# Patient Record
Sex: Male | Born: 1960 | Race: White | Hispanic: No | Marital: Married | Smoking: Never smoker
Health system: Southern US, Community
[De-identification: ages and names within clinical notes are randomized; demographics above are authoritative.]

## PROBLEM LIST (undated history)

## (undated) DIAGNOSIS — E785 Hyperlipidemia, unspecified: Secondary | ICD-10-CM

## (undated) DIAGNOSIS — M1990 Unspecified osteoarthritis, unspecified site: Secondary | ICD-10-CM

## (undated) DIAGNOSIS — I10 Essential (primary) hypertension: Secondary | ICD-10-CM

---

## 2006-08-30 ENCOUNTER — Emergency Department: Payer: Self-pay | Admitting: Emergency Medicine

## 2006-08-30 ENCOUNTER — Other Ambulatory Visit: Payer: Self-pay

## 2006-09-19 ENCOUNTER — Ambulatory Visit: Payer: Self-pay | Admitting: Family Medicine

## 2006-10-01 ENCOUNTER — Ambulatory Visit: Payer: Self-pay | Admitting: Gastroenterology

## 2006-10-06 ENCOUNTER — Ambulatory Visit: Payer: Self-pay | Admitting: Surgery

## 2006-10-06 HISTORY — PX: CHOLECYSTECTOMY: SHX55

## 2016-02-20 ENCOUNTER — Emergency Department: Payer: BLUE CROSS/BLUE SHIELD

## 2016-02-20 ENCOUNTER — Encounter: Payer: Self-pay | Admitting: Emergency Medicine

## 2016-02-20 ENCOUNTER — Emergency Department
Admission: EM | Admit: 2016-02-20 | Discharge: 2016-02-20 | Disposition: A | Discharge status: Home / Self Care | Payer: BLUE CROSS/BLUE SHIELD | Attending: Emergency Medicine | Admitting: Emergency Medicine

## 2016-02-20 DIAGNOSIS — R079 Chest pain, unspecified: Secondary | ICD-10-CM | POA: Diagnosis present

## 2016-02-20 LAB — BASIC METABOLIC PANEL
ANION GAP: 6 (ref 5–15)
BUN: 21 mg/dL — AB (ref 6–20)
CHLORIDE: 107 mmol/L (ref 101–111)
CO2: 27 mmol/L (ref 22–32)
Calcium: 9.3 mg/dL (ref 8.9–10.3)
Creatinine, Ser: 0.7 mg/dL (ref 0.61–1.24)
GFR calc Af Amer: >60 mL/min (ref >60)
Glucose, Bld: 123 mg/dL — ABNORMAL HIGH (ref 65–99)
POTASSIUM: 4.1 mmol/L (ref 3.5–5.1)
SODIUM: 140 mmol/L (ref 135–145)

## 2016-02-20 LAB — CBC
HEMATOCRIT: 50.5 % (ref 40.0–52.0)
HEMOGLOBIN: 17.1 g/dL (ref 13.0–18.0)
MCH: 29.2 pg (ref 26.0–34.0)
MCHC: 33.8 g/dL (ref 32.0–36.0)
MCV: 86.3 fL (ref 80.0–100.0)
Platelets: 291 10*3/uL (ref 150–440)
RBC: 5.85 MIL/uL (ref 4.40–5.90)
RDW: 13.6 % (ref 11.5–14.5)
WBC: 9.8 10*3/uL (ref 3.8–10.6)

## 2016-02-20 LAB — TROPONIN I
Troponin I: <0.03 ng/mL (ref <0.031)
Troponin I: 0.03 ng/mL (ref <0.031)

## 2016-02-20 LAB — FIBRIN DERIVATIVES D-DIMER (ARMC ONLY): FIBRIN DERIVATIVES D-DIMER (ARMC): 333 (ref 0–499)

## 2016-02-20 MED ORDER — ASPIRIN 81 MG PO CHEW
324.0000 mg | CHEWABLE_TABLET | Freq: Once | ORAL | Status: AC
  Administered 2016-02-20: 324 mg via ORAL
  Filled 2016-02-20: qty 4

## 2016-02-20 NOTE — ED Provider Notes (Signed)
[facility] Emergency Department Provider Note  ____________________________________________  Time seen: 10:05 AM  I have reviewed the triage vital signs and the nursing notes.   HISTORY  Chief Complaint Chest Pain     HPI Vincent Clements is a 55 y.o. male presents with intermittent left chest pain lasting approximately 5 minutes over the past week. Patient denies any dyspnea no nausea or vomiting or diaphoresis. Patient states the episode this morning started at 6 AM and has persisted. He describes the pain as an aching sensation in his left chest.   Past medical history None There are no active problems to display for this patient.   Past surgical history None  Current Outpatient Rx  Name  Route  Sig  Dispense  Refill  . aspirin-acetaminophen-caffeine (EXCEDRIN MIGRAINE) 250-250-65 MG tablet   Oral   Take 1 tablet by mouth every 6 (six) hours as needed for headache.           Allergies No known drug allergies History reviewed. No pertinent family history.  Social History Social History  Substance Use Topics  . Smoking status: Never Smoker   . Smokeless tobacco: None  . Alcohol Use: Yes    Review of Systems  Constitutional: Negative for fever. Eyes: Negative for visual changes. ENT: Negative for sore throat. Cardiovascular: Positive for chest pain. Respiratory: Negative for shortness of breath. Gastrointestinal: Negative for abdominal pain, vomiting and diarrhea. Genitourinary: Negative for dysuria. Musculoskeletal: Negative for back pain. Skin: Negative for rash. Neurological: Negative for headaches, focal weakness or numbness.   10-point ROS otherwise negative.  ____________________________________________   PHYSICAL EXAM:  VITAL SIGNS: ED Triage Vitals  Enc Vitals Group     BP 02/20/16 0719 148/81 mmHg     Pulse Rate 02/20/16 0719 80     Resp 02/20/16 0719 20     Temp 02/20/16 0719 98.2 F (36.8 C)     Temp Source  02/20/16 0719 Oral     SpO2 02/20/16 0719 95 %     Weight 02/20/16 0719 250 lb (113.399 kg)     Height 02/20/16 0719 5\' 6"  (1.676 m)     Head Cir --      Peak Flow --      Pain Score 02/20/16 0715 0     Pain Loc --      Pain Edu? --      Excl. in [place]? --      Constitutional: Alert and oriented. Well appearing and in no distress. Eyes: Conjunctivae are normal. PERRL. Normal extraocular movements. ENT   Head: Normocephalic and atraumatic.   Nose: No congestion/rhinnorhea.   Mouth/Throat: Mucous membranes are moist.   Neck: No stridor. Hematological/Lymphatic/Immunilogical: No cervical lymphadenopathy. Cardiovascular: Normal rate, regular rhythm. Normal and symmetric distal pulses are present in all extremities. No murmurs, rubs, or gallops. Respiratory: Normal respiratory effort without tachypnea nor retractions. Breath sounds are clear and equal bilaterally. No wheezes/rales/rhonchi. Gastrointestinal: Soft and nontender. No distention. There is no CVA tenderness. Genitourinary: deferred Musculoskeletal: Nontender with normal range of motion in all extremities. No joint effusions.  No lower extremity tenderness nor edema. Neurologic:  Normal speech and language. No gross focal neurologic deficits are appreciated. Speech is normal.  Skin:  Skin is warm, dry and intact. No rash noted. Psychiatric: Mood and affect are normal. Speech and behavior are normal. Patient exhibits appropriate insight and judgment.  ____________________________________________    LABS (pertinent positives/negatives)  Labs Reviewed  BASIC METABOLIC PANEL - Abnormal; Notable  for the following:    Glucose, Bld 123 (*)    BUN 21 (*)    All other components within normal limits  TROPONIN I  CBC  TROPONIN I  FIBRIN DERIVATIVES D-DIMER ([facility] ONLY)     ____________________________________________   EKG  ED ECG REPORT I, Zeph Riebel, [place], the attending physician, personally viewed and  interpreted this ECG.   Date: 02/20/2016  EKG Time: 7:10 AM  Rate: 76  Rhythm: Normal sinus rhythm  Axis: Normal  Intervals: Normal  ST&T Change: None   ____________________________________________    RADIOLOGY  DG Chest 2 View (Final result) Result time: 02/20/16 07:37:11   Final result by Rad Results In Interface (02/20/16 07:37:11)   Narrative:   CLINICAL DATA: Left-sided chest pain for 1 week.  EXAM: CHEST 2 VIEW  COMPARISON: None.  FINDINGS: The heart size and mediastinal contours are within normal limits. Both lungs are clear. The visualized skeletal structures are unremarkable.  IMPRESSION: No active cardiopulmonary disease.   Electronically Signed By: Rolm Baptise M.D. On: 02/20/2016 07:37         INITIAL IMPRESSION / ASSESSMENT AND PLAN / ED COURSE  Pertinent labs & imaging results that were available during my care of the patient were reviewed by me and considered in my medical decision making (see chart for details).  History of physical exam concerning for possible angina. Patient without any pain at this time cardiac enzymes negative 2 d-dimer is negative we'll refer patient to cardiologist on-call for outpatient stress test.  ____________________________________________   FINAL CLINICAL IMPRESSION(S) / ED DIAGNOSES  Final diagnoses:  Chest pain, unspecified chest pain type      Gregor Hams, MD 02/20/16 1401

## 2016-02-20 NOTE — ED Notes (Signed)
Pt to ed with c/o chest pain that started about 1 week ago in left chest, intermittent,  Reports worse last night, last episode about 6 am today.  Pt reports nausea, denies vomiting, and reports dizziness associated with the pain.

## 2016-02-20 NOTE — Discharge Instructions (Signed)
Nonspecific Chest Pain  °Chest pain can be caused by many different conditions. There is always a chance that your pain could be related to something serious, such as a heart attack or a blood clot in your lungs. Chest pain can also be caused by conditions that are not life-threatening. If you have chest pain, it is very important to follow up with your health care provider. °CAUSES  °Chest pain can be caused by: °· Heartburn. °· Pneumonia or bronchitis. °· Anxiety or stress. °· Inflammation around your heart (pericarditis) or lung (pleuritis or pleurisy). °· A blood clot in your lung. °· A collapsed lung (pneumothorax). It can develop suddenly on its own (spontaneous pneumothorax) or from trauma to the chest. °· Shingles infection (varicella-zoster virus). °· Heart attack. °· Damage to the bones, muscles, and cartilage that make up your chest wall. This can include: °¨ Bruised bones due to injury. °¨ Strained muscles or cartilage due to frequent or repeated coughing or overwork. °¨ Fracture to one or more ribs. °¨ Sore cartilage due to inflammation (costochondritis). °RISK FACTORS  °Risk factors for chest pain may include: °· Activities that increase your risk for trauma or injury to your chest. °· Respiratory infections or conditions that cause frequent coughing. °· Medical conditions or overeating that can cause heartburn. °· Heart disease or family history of heart disease. °· Conditions or health behaviors that increase your risk of developing a blood clot. °· Having had chicken pox (varicella zoster). °SIGNS AND SYMPTOMS °Chest pain can feel like: °· Burning or tingling on the surface of your chest or deep in your chest. °· Crushing, pressure, aching, or squeezing pain. °· Dull or sharp pain that is worse when you move, cough, or take a deep breath. °· Pain that is also felt in your back, neck, shoulder, or arm, or pain that spreads to any of these areas. °Your chest pain may come and go, or it may stay  constant. °DIAGNOSIS °Lab tests or other studies may be needed to find the cause of your pain. Your health care provider may have you take a test called an ambulatory ECG (electrocardiogram). An ECG records your heartbeat patterns at the time the test is performed. You may also have other tests, such as: °· Transthoracic echocardiogram (TTE). During echocardiography, sound waves are used to create a picture of all of the heart structures and to look at how blood flows through your heart. °· Transesophageal echocardiogram (TEE). This is a more advanced imaging test that obtains images from inside your body. It allows your health care provider to see your heart in finer detail. °· Cardiac monitoring. This allows your health care provider to monitor your heart rate and rhythm in real time. °· Holter monitor. This is a portable device that records your heartbeat and can help to diagnose abnormal heartbeats. It allows your health care provider to track your heart activity for several days, if needed. °· Stress tests. These can be done through exercise or by taking medicine that makes your heart beat more quickly. °· Blood tests. °· Imaging tests. °TREATMENT  °Your treatment depends on what is causing your chest pain. Treatment may include: °· Medicines. These may include: °¨ Acid blockers for heartburn. °¨ Anti-inflammatory medicine. °¨ Pain medicine for inflammatory conditions. °¨ Antibiotic medicine, if an infection is present. °¨ Medicines to dissolve blood clots. °¨ Medicines to treat coronary artery disease. °· Supportive care for conditions that do not require medicines. This may include: °¨ Resting. °¨ Applying heat   or cold packs to injured areas. °¨ Limiting activities until pain decreases. °HOME CARE INSTRUCTIONS °· If you were prescribed an antibiotic medicine, finish it all even if you start to feel better. °· Avoid any activities that bring on chest pain. °· Do not use any tobacco products, including  cigarettes, chewing tobacco, or electronic cigarettes. If you need help quitting, ask your health care provider. °· Do not drink alcohol. °· Take medicines only as directed by your health care provider. °· Keep all follow-up visits as directed by your health care provider. This is important. This includes any further testing if your chest pain does not go away. °· If heartburn is the cause for your chest pain, you may be told to keep your head raised (elevated) while sleeping. This reduces the chance that acid will go from your stomach into your esophagus. °· Make lifestyle changes as directed by your health care provider. These may include: °¨ Getting regular exercise. Ask your health care provider to suggest some activities that are safe for you. °¨ Eating a heart-healthy diet. A registered dietitian can help you to learn healthy eating options. °¨ Maintaining a healthy weight. °¨ Managing diabetes, if necessary. °¨ Reducing stress. °SEEK MEDICAL CARE IF: °· Your chest pain does not go away after treatment. °· You have a rash with blisters on your chest. °· You have a fever. °SEEK IMMEDIATE MEDICAL CARE IF:  °· Your chest pain is worse. °· You have an increasing cough, or you cough up blood. °· You have severe abdominal pain. °· You have severe weakness. °· You faint. °· You have chills. °· You have sudden, unexplained chest discomfort. °· You have sudden, unexplained discomfort in your arms, back, neck, or jaw. °· You have shortness of breath at any time. °· You suddenly start to sweat, or your skin gets clammy. °· You feel nauseous or you vomit. °· You suddenly feel light-headed or dizzy. °· Your heart begins to beat quickly, or it feels like it is skipping beats. °These symptoms may represent a serious problem that is an emergency. Do not wait to see if the symptoms will go away. Get medical help right away. Call your local emergency services (911 in the [place] Do not drive yourself to the hospital. °  °This  information is not intended to replace advice given to you by your health care provider. Make sure you discuss any questions you have with your health care provider. °  °Document Released: 09/25/2005 Document Revised: 01/06/2015 Document Reviewed: 07/22/2014 °Elsevier Interactive Patient Education ©2016 [employer] ° °

## 2016-04-29 DIAGNOSIS — I208 Other forms of angina pectoris: Secondary | ICD-10-CM | POA: Diagnosis not present

## 2016-04-29 DIAGNOSIS — I429 Cardiomyopathy, unspecified: Secondary | ICD-10-CM | POA: Diagnosis not present

## 2016-04-29 DIAGNOSIS — R0602 Shortness of breath: Secondary | ICD-10-CM | POA: Diagnosis not present

## 2016-04-29 DIAGNOSIS — R011 Cardiac murmur, unspecified: Secondary | ICD-10-CM | POA: Diagnosis not present

## 2016-11-08 DIAGNOSIS — I208 Other forms of angina pectoris: Secondary | ICD-10-CM | POA: Diagnosis not present

## 2016-11-08 DIAGNOSIS — R0602 Shortness of breath: Secondary | ICD-10-CM | POA: Diagnosis not present

## 2016-11-08 DIAGNOSIS — I10 Essential (primary) hypertension: Secondary | ICD-10-CM | POA: Diagnosis not present

## 2016-11-08 DIAGNOSIS — I429 Cardiomyopathy, unspecified: Secondary | ICD-10-CM | POA: Diagnosis not present

## 2017-01-19 ENCOUNTER — Ambulatory Visit (INDEPENDENT_AMBULATORY_CARE_PROVIDER_SITE_OTHER): Payer: BLUE CROSS/BLUE SHIELD

## 2017-01-19 ENCOUNTER — Ambulatory Visit
Admission: EM | Admit: 2017-01-19 | Discharge: 2017-01-19 | Disposition: A | Discharge status: Home / Self Care | Payer: BLUE CROSS/BLUE SHIELD | Attending: Family Medicine | Admitting: Family Medicine

## 2017-01-19 DIAGNOSIS — M25561 Pain in right knee: Secondary | ICD-10-CM

## 2017-01-19 HISTORY — DX: Essential (primary) hypertension: I10

## 2017-01-19 MED ORDER — MELOXICAM 15 MG PO TABS: 15.0000 mg | ORAL_TABLET | Freq: Every day | ORAL | 0 refills | Status: DC

## 2017-01-19 NOTE — ED Triage Notes (Signed)
Patient complains of right knee pain. Patient states that the pain started a while back only with bending. Patient states that pain worsened around 10-14 days ago with ambulation but after walking approx 40 feet pain improved. Patient states that intense pain started yesterday and states that pain now makes it difficult to stand or walk.

## 2017-01-19 NOTE — ED Provider Notes (Signed)
[facility]    CSN: [id number] Arrival date & time: 01/19/17  0804     History   Chief Complaint Chief Complaint  Patient presents with  . Knee Pain    HPI Vincent Clements is a 56 y.o. male.   Vincent Clements is here because of right knee pain. Reports injuring the knee about 10:15 years ago while [place] and hurting his right knee. He states he saw his PCP Vincent Clements who had his mouth about 2 weeks for swelling of the right knee to go down past the knee started bothering him. Since then he's had periodic trouble with the knee but last 2 days work especially yesterday he's had increased pain and difficulty standing and walking on the right knee. He states normally in the morning when he'll have pain in the right knee uses day goes on the knee gets better this Saturday he was unable to really do anything with her right knee because the constant pain. States that the knee doesn't lock up was very stiff. Mostly on the medial aspect of the knee. He has a history hypertension he's had a cholecystectomy. His PCP is retired about over a year ago he does not smoke no known drug allergies and no pertinent family medical history relevant to today's visit   The history is provided by the patient.  Knee Pain  Location:  Knee Injury: yes   Knee location:  R knee Pain details:    Quality:  Aching and pressure   Radiates to:  Does not radiate   Severity:  Moderate   Timing:  Constant   Progression:  Worsening Chronicity:  Chronic Dislocation: no   Foreign body present:  No foreign bodies   Past Medical History:  Diagnosis Date  . Hypertension     There are no active problems to display for this patient.   Past Surgical History:  Procedure Laterality Date  . CHOLECYSTECTOMY  2006       Home Medications    Prior to Admission medications   Medication Sig Start Date End Date Taking? Authorizing Provider  carvedilol (COREG) 6.25 MG tablet Take 6.25  mg by mouth 2 (two) times daily with a meal.   Yes Historical Provider, MD  aspirin-acetaminophen-caffeine (EXCEDRIN MIGRAINE) 250-250-65 MG tablet Take 1 tablet by mouth every 6 (six) hours as needed for headache.    Historical Provider, MD  meloxicam (MOBIC) 15 MG tablet Take 1 tablet (15 mg total) by mouth daily. 01/19/17   Frederich Cha, MD    Family History History reviewed. No pertinent family history.  Social History Social History  Substance Use Topics  . Smoking status: Never Smoker  . Smokeless tobacco: Never Used  . Alcohol use Yes     Comment: occasionally     Allergies   Patient has no known allergies.   Review of Systems Review of Systems  Musculoskeletal: Positive for arthralgias, gait problem, joint swelling and myalgias.  All other systems reviewed and are negative.    Physical Exam Triage Vital Signs ED Triage Vitals  Enc Vitals Group     BP 01/19/17 0836 (!) 154/84     Pulse Rate 01/19/17 0836 71     Resp 01/19/17 0836 18     Temp 01/19/17 0836 98.7 F (37.1 C)     Temp Source 01/19/17 0836 Oral     SpO2 01/19/17 0836 98 %     Weight 01/19/17 0834 255 lb (  115.7 kg)     Height 01/19/17 0834 5\' 10"  (1.778 m)     Head Circumference --      Peak Flow --      Pain Score 01/19/17 0836 7     Pain Loc --      Pain Edu? --      Excl. in [place]? --    No data found.   Updated Vital Signs BP (!) 154/84 (BP Location: Left Arm)   Pulse 71   Temp 98.7 F (37.1 C) (Oral)   Resp 18   Ht 5\' 10"  (1.778 m)   Wt 255 lb (115.7 kg)   SpO2 98%   BMI 36.59 kg/m   Visual Acuity Right Eye Distance:   Left Eye Distance:   Bilateral Distance:    Right Eye Near:   Left Eye Near:    Bilateral Near:     Physical Exam  Constitutional: He is oriented to person, place, and time. He appears well-developed and well-nourished.  HENT:  Head: Normocephalic and atraumatic.  Eyes: Pupils are equal, round, and reactive to light.  Neck: Normal range of motion. Neck  supple.  Musculoskeletal: He exhibits tenderness. He exhibits no deformity.       Right knee: He exhibits swelling and bony tenderness. He exhibits normal range of motion, normal alignment and no LCL laxity. Tenderness found.       Legs: Patient has tenderness over the medial aspect of the right knee are good range of motion knee joint appears to be stable  Neurological: He is alert and oriented to person, place, and time.  Skin: Skin is warm and dry.  Psychiatric: He has a normal mood and affect.  Vitals reviewed.    UC Treatments / Results  Labs (all labs ordered are listed, but only abnormal results are displayed) Labs Reviewed - No data to display  EKG  EKG Interpretation None       Radiology Dg Knee Complete 4 Views Right  Result Date: 01/19/2017 CLINICAL DATA:  Chronic medial knee pain.  No trauma. EXAM: RIGHT KNEE - COMPLETE 4+ VIEW COMPARISON:  None. FINDINGS: No evidence of fracture, dislocation, or joint effusion. No evidence of arthropathy or other focal bone abnormality. Soft tissues are unremarkable. IMPRESSION: Negative. Electronically Signed   By: Dorise Bullion III M.D   On: 01/19/2017 09:50    Procedures Procedures (including critical care time)  Medications Ordered in UC Medications - No data to display   Initial Impression / Assessment and Plan / UC Course  I have reviewed the triage vital signs and the nursing notes.  Pertinent labs & imaging results that were available during my care of the patient were reviewed by me and considered in my medical decision making (see chart for details).  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 He may need to be off longer than the 2 days I gave him but he'll need a PCP to do that. Also recommend finding a PCP is O recommend orthopedic referral for joint injection possible steroids and  even joint replacement fluid hyaluronic acid to improve and to reduce chances of chronic arthritis developing.  Final Clinical Impressions(s) / UC Diagnoses   Final diagnoses:  Acute pain of right knee  Pain in joint of right knee    New Prescriptions Discharge Medication List as of 01/19/2017 10:03 AM    START taking these medications   Details  meloxicam (MOBIC) 15 MG tablet Take 1 tablet (15 mg total) by mouth daily., Starting Sun 01/19/2017, Normal        Note: This dictation was prepared with Dragon dictation along with smaller phrase technology. Any transcriptional errors that result from this process are unintentional.   Frederich Cha, MD 01/19/17 1016

## 2017-01-19 NOTE — ED Notes (Signed)
Called Xray for transport  

## 2017-01-21 ENCOUNTER — Encounter: Payer: Self-pay | Admitting: Family Medicine

## 2017-01-21 ENCOUNTER — Ambulatory Visit (INDEPENDENT_AMBULATORY_CARE_PROVIDER_SITE_OTHER): Payer: BLUE CROSS/BLUE SHIELD | Admitting: Family Medicine

## 2017-01-21 VITALS — BP 150/88 | HR 88 | Temp 98.7°F | Resp 16 | Ht 70.0 in | Wt 260.5 lb

## 2017-01-21 DIAGNOSIS — M25561 Pain in right knee: Secondary | ICD-10-CM | POA: Diagnosis not present

## 2017-01-21 DIAGNOSIS — G8929 Other chronic pain: Secondary | ICD-10-CM | POA: Diagnosis not present

## 2017-01-21 MED ORDER — CYCLOBENZAPRINE HCL 10 MG PO TABS: 5.0000 mg | ORAL_TABLET | Freq: Three times a day (TID) | ORAL | 1 refills | Status: DC | PRN

## 2017-01-21 MED ORDER — METHYLPREDNISOLONE ACETATE 40 MG/ML IJ SUSP
40.0000 mg | Freq: Once | INTRAMUSCULAR | Status: AC
  Administered 2017-01-21: 40 mg via INTRA_ARTICULAR

## 2017-01-21 MED ORDER — LIDOCAINE HCL (PF) 1 % IJ SOLN
4.0000 mL | Freq: Once | INTRAMUSCULAR | Status: AC
  Administered 2017-01-21: 4 mL

## 2017-01-21 MED ORDER — MELOXICAM 15 MG PO TABS: 15.0000 mg | ORAL_TABLET | Freq: Every day | ORAL | 2 refills | Status: DC

## 2017-01-21 NOTE — Patient Instructions (Signed)
Thank you for coming in to clinic today.  I am concerned for possible degenerative meniscus tear in your knee given degree of pain and limited weight bearing, based on your history and exam today - Possibly you may have mild arthritis wear and tear in the knee chronically, but this does not seem to be major due to mostly normal x-ray recently  You received a Right Knee Joint steroid injection today. - Lidocaine numbing medicine may ease the pain initially for a few hours until it wears off - As discussed, you may experience a "steroid flare" this evening or within 24-48 hours, anytime medicine is injected into an inflamed joint it can cause the pain to get worse temporarily - Everyone responds differently to these injections, it depends on the patient and the severity of the joint problem, it may provide anywhere from days to weeks, to months of relief. Ideal response is >6 months relief - Try to take it easy for next 1-2 days, avoid over activity and strain on joint (limit walking for knee) - Recommend the following: Use RICE therapy: - R - Rest / relative rest with activity modification avoid overuse and frequent bending or pressure on bent knee - I - Ice packs (make sure you use a towel or sock / something to protect skin) - C - Compression with a velcro or flexible Knee Sleeve to apply pressure and reduce swelling allowing more support - E - Elevation - if significant swelling, lift leg above heart level (toes above your nose) to help reduce swelling, most helpful at night after day of being on your feet  Continue Meloxicam 15mg  daily anti-inflammatory (do not take any other aleve, naproxen, ibuprofen, advil)  Recommend to start taking Tylenol Extra Strength 500mg  tabs - take 1 to 2 tabs per dose (max 1000mg ) every 6-8 hours for pain (take regularly, don't skip a dose for next 7 days), max 24 hour daily dose is 6 tablets or 3000mg . In the future you can repeat the same everyday Tylenol course  for 1-2 weeks at a time.  - This is safe to take with anti-inflammatory medicines (Ibuprofen, Advil, Naproxen, Aleve, Meloxicam, Mobic)  Start Cyclobenzapine (Flexeril) 10mg  tablets (muscle relaxant) - start with half (cut) to one whole pill at night for muscle relaxant - may make you sedated or sleepy (be careful driving or working on this) if tolerated you can take half to whole tab 2 to 3 times daily or every 8 hours as needed  If not improving as expected over next several weeks, may need further evaluation, and we have already placed referral to Orthopedics, see below.  [facility] [place], [place]  [address] Phone: [phone]   Please schedule a follow-up appointment with Dr. Parks Ranger in 3-4 weeks to follow-up Right Knee pain, FMLA return to work  If you have any other questions or concerns, please feel free to call the clinic or send a message through [place]. You may also schedule an earlier appointment if necessary.  Vincent Putnam, DO [place]

## 2017-01-21 NOTE — Progress Notes (Signed)
Subjective:    Patient ID: Vincent Clements, male    DOB: 1961/04/10, 56 y.o.   MRN: [id number]  Vincent Clements is a 56 y.o. male presenting on 01/21/2017 for Establish Care (pt had old injury 15 years but now has R knee pain getting stiff with ROM onset 2 month )  Patient presents to establish care with new provider, but here with acute concern Knee pain. His prior PCP has retired within past 1 year.  HPI  RIGHT KNEE PAIN, Acute on Chronic: - Reports chronic history suspected initial knee injury back in 2005 while outside hunting, describes tripping in the woods and fell and rock, without bruising or swelling or other acute injury. Developed some gradual aching pain intermittently since then, saw original PCP back in 2005 and given knee brace, otherwise no formal diagnosis or other treatment - Admits worsening over past 6-12 months with Right knee stiffness worse with bending forward or other activities, then gradual stiffness would improve. He has had some intermittent pain following the stiffness for several months - Now recent acute problem for past 3 days with onset 01/18/17. He was seen recently at [facility] on 01/19/17 and had R knee x-ray (no evidence of osteoarthritis or fracture), given Meloxicam 15mg  - Today reports overall no improvement. Describes over past 1-2 weeks had intermittent pain, but has been constant pain for past 3 days, mostly medial knee pain described as burning associated with a throbbing pain radiating down leg occasionally, initially up to 9/10, ranging from 3-4/10 in morning and afternoon and currently pain gradually increases up to 6-7/10. Some improvement in morning when wakes up after being off of knee. By end of day worsening after being on feet. - Additionally admits some locking of knee with difficulty straightening - Taking Meloxicam 15mg  daily, not taking Tylenol - Tried ice and elevation with some temporary improvement. No brace or sleeve. Uses cane to assist with  going up steps. 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 Smoking status: Never Smoker  . Smokeless tobacco: Former Systems developer    Types: Chew, [place] date: 12/31/1991     Comment: pt quit chewing tobacco 01/11/2006  . Alcohol use Yes     Comment: occasionally, 1 per week    Review of Systems Per HPI unless specifically indicated above     Objective:    BP (!) 150/88 (BP Location: Left Arm, Cuff Size: Normal)   Pulse 88   Temp 98.7 F (37.1 C) (Oral)   Resp 16   Ht 5\' 10"  (1.778 m)   Wt 260 lb 8 oz (118.2 kg)   BMI 37.38 kg/m   Wt Readings from Last 3 Encounters:  01/21/17 260 lb 8 oz (118.2 kg)  01/19/17 255 lb (115.7 kg)  02/20/16 250 lb (113.4 kg)    Physical Exam  Constitutional: He is oriented to person, place, and time. He appears well-developed and well-nourished. No distress.  Well-appearing, uncomfortable due to Right knee pain, while seated mostly comfortable, cooperative  HENT:  Head: Normocephalic and atraumatic.  Mouth/Throat: Oropharynx is clear and moist.  Eyes: Conjunctivae are normal.  Cardiovascular:  Normal rate, regular rhythm, normal heart sounds and intact distal pulses.   No murmur heard. Pulmonary/Chest: Effort normal and breath sounds normal. No respiratory distress. He has no wheezes. He has no rales.  Musculoskeletal:  Right Knees Inspection: Mild bulky appearance bilateral knees, R>L. No ecchymosis or significant joint effusion, some localized soft tissue edema R knee medial > lateral Palpation: Moderate +TTP Right knee medial joint line only. Bilateral fine crepitus R>L under patella with  movement ROM: Right knee limited flexion due to pain, mostly intact extension, L knee full active ROM Special Testing: Lachman / Valgus/Varus tests negative with intact ligaments (ACL, MCL, LCL). McMurray unable to be tested due to inability to obtain correct position by not able to get onto table. Standing Thessaly test positive for pain worse with medial rotation. Strength: 5/5 intact knee flex/ext, ankle dorsi/plantarflex Neurovascular: distally intact sensation light touch and pulses  Neurological: He is alert and oriented to person, place, and time.  Skin: Skin is warm and dry. No rash noted. He is not diaphoretic. No erythema.  Psychiatric: His behavior is normal.  Nursing note and vitals reviewed.   ________________________________________________________ PROCEDURE NOTE Date: 01/21/17 Right Knee steroid injection Discussed benefits and risks (including pain, bleeding, infection, steroid flare). Verbal consent given by patient. Medication:  1 cc Depo-medrol 40mg  and 4 cc Lidocaine 1% without epi Time Out taken  Landmarks identified. Area cleansed with alcohol wipes.Using 21 gauge and 1, 1/2 inch needle, Right knee joint space was injected (with above listed medication) via lateral approach cold spray used for superficial anesthetic.Sterile bandage placed.Patient tolerated procedure well without bleeding or paresthesias.No complications.  ------------------  I have personally reviewed the radiology report from Right Knee X-ray on 01/19/17.  CLINICAL DATA:  Chronic medial knee pain.  No trauma.  EXAM: RIGHT KNEE - COMPLETE 4+ VIEW  COMPARISON:  None.  FINDINGS: No evidence of fracture, dislocation, or joint effusion. No evidence of arthropathy or other focal bone abnormality. Soft tissues are unremarkable.  IMPRESSION: Negative.   Electronically Signed   By: Dorise Bullion III M.D   On: 01/19/2017 09:50  I have personally reviewed the following lab results from  02/20/16.  Results for orders placed or performed during the hospital encounter of [id number]  Basic metabolic panel  Result Value Ref Range   Sodium 140 135 - 145 mmol/L   Potassium 4.1 3.5 - 5.1 mmol/L   Chloride 107 101 - 111 mmol/L   CO2 27 22 - 32 mmol/L   Glucose, Bld 123 (H) 65 - 99 mg/dL   BUN 21 (H) 6 - 20 mg/dL   Creatinine, Ser 0.70 0.61 - 1.24 mg/dL   Calcium 9.3 8.9 - 10.3 mg/dL   GFR calc non Af Amer >60 >60 mL/min   GFR calc Af Amer >60 >60 mL/min   Anion gap 6 5 - 15  Troponin I  Result Value Ref Range   Troponin I <0.03 <0.031 ng/mL  CBC  Result Value Ref Range   WBC 9.8 3.8 - 10.6 K/uL   RBC 5.85 4.40 - 5.90 MIL/uL   Hemoglobin 17.1 13.0 - 18.0 g/dL   HCT 50.5 40.0 - 52.0 %   MCV 86.3 80.0 - 100.0 fL   MCH 29.2 26.0 - 34.0 pg   MCHC 33.8 32.0 - 36.0 g/dL   RDW 13.6 11.5 - 14.5 %   Platelets 291 150 - 440 K/uL  Troponin I  Result Value Ref Range   Troponin I 0.03 <0.031 ng/mL  Fibrin derivatives D-Dimer  Result Value Ref Range   Fibrin derivatives D-dimer ([facility]) 333 0 - 499      Assessment & Plan:   Problem List Items Addressed This Visit    Right medial knee pain - Primary    Acute on chronic R medial Knee pain and swelling without known injury or trauma, but some chronic history >13 years of intermittent R knee pain and stiffness, old traumatic injury without known complication. No known underlying OA/DJD, none identified on recent R-knee x-ray 1/21 from urgent care. 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 Continue NSAID with Meloxicam 15mg  daily 2. Start  Tylenol 500-1000mg  per dose TID regularly for 1-2 weeks then PRN breakthrough 3. Start muscle relaxant with Flexeril 10mg  tabs - take 5-10mg  up to TID PRN, titrate up as tolerated 4. RICE therapy (rest, ice, compression, elevation) for swelling, activity modification 5. R knee steroid injection today, see note for details, tolerated well. Some improvement reported. 6. Urgent referral to [facility] for further evaluation and management given significant persistent symptoms, limiting his ability to work, may need further treatment with PT / MRI or other future surgical considerations if not improved from steroid injection 7. Completed FMLA since he is unable to work currently, anticipate out up to 4 weeks, see FMLA for details 8. 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 aspirin EC 81 MG tablet    Sig: Take by mouth.  . meloxicam (MOBIC) 15 MG tablet    Sig: Take 1 tablet (15 mg total) by mouth daily.    Dispense:  30 tablet    Refill:  2    Keep refills on file  . cyclobenzaprine (FLEXERIL) 10 MG tablet    Sig: Take 0.5-1 tablets (5-10 mg total) by mouth 3 (three) times daily as needed for muscle  spasms.    Dispense:  30 tablet    Refill:  1  . methylPREDNISolone acetate (DEPO-MEDROL) injection 40 mg  . lidocaine (PF) (XYLOCAINE) 1 % injection 4 mL      Follow up plan: Return in about 3 weeks (around 02/11/2017) for Right knee pain / FMLA return to work.  He will return for establish care visit.  Nobie Putnam, [facility] 01/21/2017, 4:31 PM

## 2017-01-21 NOTE — Assessment & Plan Note (Signed)
Acute on chronic R medial Knee pain and swelling without known injury or trauma, but some chronic history >13 years of intermittent R knee pain and stiffness, old traumatic injury without known complication. No known underlying OA/DJD, none identified on recent R-knee x-ray 1/21 from urgent care. 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 Continue NSAID with Meloxicam 15mg  daily 2. Start Tylenol 500-1000mg  per dose TID regularly for 1-2 weeks then PRN breakthrough 3. Start muscle relaxant with Flexeril 10mg  tabs - take 5-10mg  up to TID PRN, titrate up as tolerated 4. RICE therapy (rest, ice, compression, elevation) for swelling, activity modification 5. R knee steroid injection today, see note for details, tolerated well. Some improvement reported. 6. Urgent referral to [facility] for further evaluation and management given significant persistent symptoms, limiting his ability to work, may need further treatment with PT / MRI or other future surgical considerations if not improved from steroid injection 7. Completed FMLA since he is unable to work currently, anticipate out up to 4 weeks, see FMLA for details 8. Follow-up with me in 3 weeks to re-evaluate return to work status - future consider topical diclofenac or other alternative meds if needed

## 2017-01-21 NOTE — Progress Notes (Signed)
FMLA Information Received FMLA paperwork for patient on 01/21/17 for initial request. Diagnosis / Indication: Right Medial Knee Pain (ICD10: M25.561), Chronic Right Knee Pain (ICD10: M25.561, G89.29) Symptoms: Acute on chronic Right medial Knee Pain, associated with stiffness, swelling, limiting ability to ambulate and function Job limitations: Prolonged standing, ambulation, lifting, pulling, bending Most recent office visit: 01/21/17 Hospitalizations: n/a Urgent Care visit: 01/19/17 Approximate date of onset: 01/18/17 Probable duration: up to 4 weeks, potentially longer pending further management / evaluation Will likely need visit twice per year due to condition - Yes Referral to Orthopedics Surgery [facility]), further evaluation and management Incapacitated dates: 01/18/17 - 02/17/17 Additional appointments necessary for treatment: Yes  Frequency of requested intermittent medical leave: follow-up every 2-4 weeks for 1-3 months, pending ortho evaluation, 1 day off per visit. Future Periodic flare-ups may involve 1 episode per 3 months with 2 days off  Clinical Considerations / Treatment: Meloxicam 15mg , Flexeril 10mg  PRN, s/p steroid injection R knee 01/21/17, referral to Ortho, may need PT in future, consider MRI if not improving, concern for possible meniscus tear, may potentially need surgery per ortho in future if indicated.  Discussed FMLA paperwork with patient during office visit.  Completed, signed, and dated FMLA paperwork. Returned to [facility] office staff today to be picked up by patient tomorrow 01/22/17. Patient notified. Copy to be scanned into chart.  Nobie Putnam, [facility] 01/21/2017, 3:58 PM

## 2017-01-21 NOTE — Assessment & Plan Note (Signed)
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

## 2017-02-03 ENCOUNTER — Encounter: Payer: Self-pay | Admitting: Family Medicine

## 2017-02-03 NOTE — Progress Notes (Signed)
Completed Request of Information Paperwork from insurance [employer], received initial fax 01/30/17, and repeat fax on 01/31/17.  See recent office visit 01/21/17 and documented initial FMLA paperwork from 01/21/17.  Paperwork from [employer] completed, signed, and dated 02/03/17 to be faxed back along with copy of last office note, 01/21/17.  Nobie Putnam, [facility] 02/03/2017, 12:45 PM

## 2017-02-05 ENCOUNTER — Other Ambulatory Visit: Payer: Self-pay | Admitting: Orthopedic Surgery

## 2017-02-05 DIAGNOSIS — M25561 Pain in right knee: Secondary | ICD-10-CM | POA: Diagnosis not present

## 2017-02-12 ENCOUNTER — Other Ambulatory Visit: Payer: Self-pay | Admitting: Orthopedic Surgery

## 2017-02-12 DIAGNOSIS — Z1389 Encounter for screening for other disorder: Secondary | ICD-10-CM

## 2017-02-12 DIAGNOSIS — Z0189 Encounter for other specified special examinations: Secondary | ICD-10-CM

## 2017-02-13 ENCOUNTER — Ambulatory Visit: Payer: BLUE CROSS/BLUE SHIELD | Admitting: Family Medicine

## 2017-02-14 ENCOUNTER — Ambulatory Visit
Admission: RE | Admit: 2017-02-14 | Discharge: 2017-02-14 | Disposition: A | Discharge status: Home / Self Care | Payer: BLUE CROSS/BLUE SHIELD | Source: Ambulatory Visit | Attending: Orthopedic Surgery | Admitting: Orthopedic Surgery

## 2017-02-14 DIAGNOSIS — M25561 Pain in right knee: Secondary | ICD-10-CM

## 2017-02-14 DIAGNOSIS — S83241A Other tear of medial meniscus, current injury, right knee, initial encounter: Secondary | ICD-10-CM | POA: Insufficient documentation

## 2017-02-14 DIAGNOSIS — Z01818 Encounter for other preprocedural examination: Secondary | ICD-10-CM | POA: Diagnosis not present

## 2017-02-14 DIAGNOSIS — Z1389 Encounter for screening for other disorder: Secondary | ICD-10-CM

## 2017-02-14 DIAGNOSIS — X58XXXA Exposure to other specified factors, initial encounter: Secondary | ICD-10-CM | POA: Diagnosis not present

## 2017-02-14 DIAGNOSIS — M949 Disorder of cartilage, unspecified: Secondary | ICD-10-CM | POA: Insufficient documentation

## 2017-02-18 DIAGNOSIS — M23221 Derangement of posterior horn of medial meniscus due to old tear or injury, right knee: Secondary | ICD-10-CM | POA: Diagnosis not present

## 2017-02-21 ENCOUNTER — Encounter
Admission: RE | Admit: 2017-02-21 | Discharge: 2017-02-21 | Disposition: A | Discharge status: Home / Self Care | Payer: BLUE CROSS/BLUE SHIELD | Source: Ambulatory Visit | Attending: Orthopedic Surgery | Admitting: Orthopedic Surgery

## 2017-02-21 DIAGNOSIS — Z01818 Encounter for other preprocedural examination: Secondary | ICD-10-CM | POA: Diagnosis not present

## 2017-02-21 DIAGNOSIS — M23221 Derangement of posterior horn of medial meniscus due to old tear or injury, right knee: Secondary | ICD-10-CM | POA: Diagnosis not present

## 2017-02-21 DIAGNOSIS — I444 Left anterior fascicular block: Secondary | ICD-10-CM | POA: Diagnosis not present

## 2017-02-21 DIAGNOSIS — Z888 Allergy status to other drugs, medicaments and biological substances status: Secondary | ICD-10-CM | POA: Insufficient documentation

## 2017-02-21 DIAGNOSIS — I10 Essential (primary) hypertension: Secondary | ICD-10-CM | POA: Diagnosis not present

## 2017-02-21 DIAGNOSIS — R9431 Abnormal electrocardiogram [ECG] [EKG]: Secondary | ICD-10-CM | POA: Insufficient documentation

## 2017-02-21 HISTORY — DX: Unspecified osteoarthritis, unspecified site: M19.90

## 2017-02-21 NOTE — Patient Instructions (Addendum)
  Your procedure is scheduled on: 02/27/17 Thurs Report to Same Day Surgery 2nd floor medical mall [facility] Entrance-take elevator on left to 2nd floor.  Check in with surgery information desk.) To find out your arrival time please call [phone] between 1PM - 3PM on 02/26/17 Wed  Remember: Instructions that are not followed completely may result in serious medical risk, up to and including death, or upon the discretion of your surgeon and anesthesiologist your surgery may need to be rescheduled.    _x___ 1. Do not eat food or drink liquids after midnight. No gum chewing or hard candies.     __x__ 2. No Alcohol for 24 hours before or after surgery.   __x__3. No Smoking for 24 prior to surgery.   ____  4. Bring all medications with you on the day of surgery if instructed.    __x__ 5. Notify your doctor if there is any change in your medical condition     (cold, fever, infections).     Do not wear jewelry, make-up, hairpins, clips or nail polish.  Do not wear lotions, powders, or perfumes. You may wear deodorant.  Do not shave 48 hours prior to surgery. Men may shave face and neck.  Do not bring valuables to the hospital.    [facility] is not responsible for any belongings or valuables.               Contacts, dentures or bridgework may not be worn into surgery.  Leave your suitcase in the car. After surgery it may be brought to your room.  For patients admitted to the hospital, discharge time is determined by your treatment team.   Patients discharged the day of surgery will not be allowed to drive home.  You will need someone to drive you home and stay with you the night of your procedure.    Please read over the following fact sheets that you were given:   [facility] Preparing for Surgery and or MRSA Information   _x___ Take these medicines the morning of surgery with A SIP OF WATER:    1. carvedilol (COREG  2.  3.  4.  5.  6.  ____Fleets enema or Magnesium Citrate  as directed.   _x___ Use CHG Soap or sage wipes as directed on instruction sheet   ____ Use inhalers on the day of surgery and bring to hospital day of surgery  ____ Stop metformin 2 days prior to surgery    ____ Take 1/2 of usual insulin dose the night before surgery and none on the morning of           surgery.   _x___ Stop Aspirin, Coumadin, Pllavix ,Eliquis, Effient, or Pradaxa Check with Dr Clayborn Bigness regarding stopping Aspirin  x__ Stop Anti-inflammatories such as Advil, Aleve, Ibuprofen, Motrin, Naproxen,          Naprosyn, Goodies powders or aspirin products. Ok to take Tylenol.   ____ Stop supplements until after surgery.    ____ Bring C-Pap to the hospital.

## 2017-02-27 ENCOUNTER — Ambulatory Visit: Payer: BLUE CROSS/BLUE SHIELD | Admitting: Anesthesiology

## 2017-02-27 ENCOUNTER — Encounter
Admission: RE | Disposition: A | Discharge status: Home / Self Care | Payer: Self-pay | Source: Ambulatory Visit | Attending: Orthopedic Surgery

## 2017-02-27 ENCOUNTER — Encounter: Payer: Self-pay | Admitting: Anesthesiology

## 2017-02-27 ENCOUNTER — Ambulatory Visit
Admission: RE | Admit: 2017-02-27 | Discharge: 2017-02-27 | Disposition: A | Discharge status: Home / Self Care | Payer: BLUE CROSS/BLUE SHIELD | Source: Ambulatory Visit | Attending: Orthopedic Surgery | Admitting: Orthopedic Surgery

## 2017-02-27 DIAGNOSIS — S83271S Complex tear of lateral meniscus, current injury, right knee, sequela: Secondary | ICD-10-CM | POA: Diagnosis not present

## 2017-02-27 DIAGNOSIS — M23221 Derangement of posterior horn of medial meniscus due to old tear or injury, right knee: Secondary | ICD-10-CM | POA: Insufficient documentation

## 2017-02-27 DIAGNOSIS — E669 Obesity, unspecified: Secondary | ICD-10-CM | POA: Insufficient documentation

## 2017-02-27 DIAGNOSIS — S83231S Complex tear of medial meniscus, current injury, right knee, sequela: Secondary | ICD-10-CM | POA: Diagnosis not present

## 2017-02-27 DIAGNOSIS — Z6839 Body mass index (BMI) 39.0-39.9, adult: Secondary | ICD-10-CM | POA: Insufficient documentation

## 2017-02-27 DIAGNOSIS — Z7982 Long term (current) use of aspirin: Secondary | ICD-10-CM | POA: Diagnosis not present

## 2017-02-27 DIAGNOSIS — M2241 Chondromalacia patellae, right knee: Secondary | ICD-10-CM | POA: Diagnosis not present

## 2017-02-27 DIAGNOSIS — M23303 Other meniscus derangements, unspecified medial meniscus, right knee: Secondary | ICD-10-CM | POA: Diagnosis not present

## 2017-02-27 DIAGNOSIS — M23261 Derangement of other lateral meniscus due to old tear or injury, right knee: Secondary | ICD-10-CM | POA: Diagnosis not present

## 2017-02-27 DIAGNOSIS — Z888 Allergy status to other drugs, medicaments and biological substances status: Secondary | ICD-10-CM | POA: Diagnosis not present

## 2017-02-27 DIAGNOSIS — I10 Essential (primary) hypertension: Secondary | ICD-10-CM | POA: Insufficient documentation

## 2017-02-27 DIAGNOSIS — M23300 Other meniscus derangements, unspecified lateral meniscus, right knee: Secondary | ICD-10-CM | POA: Diagnosis not present

## 2017-02-27 HISTORY — PX: KNEE ARTHROSCOPY: SHX127

## 2017-02-27 SURGERY — ARTHROSCOPY, KNEE
Anesthesia: General | Laterality: Right | Wound class: Clean

## 2017-02-27 MED ORDER — ONDANSETRON HCL 4 MG/2ML IJ SOLN: 4.0000 mg | Freq: Once | INTRAMUSCULAR | Status: DC | PRN

## 2017-02-27 MED ORDER — ONDANSETRON HCL 4 MG/2ML IJ SOLN
INTRAMUSCULAR | Status: DC | PRN
  Administered 2017-02-27: 4 mg via INTRAVENOUS

## 2017-02-27 MED ORDER — ONDANSETRON HCL 4 MG/2ML IJ SOLN: 4.0000 mg | Freq: Four times a day (QID) | INTRAMUSCULAR | Status: DC | PRN

## 2017-02-27 MED ORDER — ONDANSETRON HCL 4 MG/2ML IJ SOLN
INTRAMUSCULAR | Status: AC
  Filled 2017-02-27: qty 2

## 2017-02-27 MED ORDER — FAMOTIDINE 20 MG PO TABS
20.0000 mg | ORAL_TABLET | Freq: Once | ORAL | Status: AC
  Administered 2017-02-27: 20 mg via ORAL

## 2017-02-27 MED ORDER — BUPIVACAINE-EPINEPHRINE (PF) 0.5% -1:200000 IJ SOLN
INTRAMUSCULAR | Status: AC
  Filled 2017-02-27: qty 30

## 2017-02-27 MED ORDER — KETOROLAC TROMETHAMINE 30 MG/ML IJ SOLN
INTRAMUSCULAR | Status: DC | PRN
  Administered 2017-02-27: 30 mg via INTRAVENOUS

## 2017-02-27 MED ORDER — METOCLOPRAMIDE HCL 5 MG/ML IJ SOLN: 5.0000 mg | Freq: Three times a day (TID) | INTRAMUSCULAR | Status: DC | PRN

## 2017-02-27 MED ORDER — DEXAMETHASONE SODIUM PHOSPHATE 10 MG/ML IJ SOLN
INTRAMUSCULAR | Status: AC
  Filled 2017-02-27: qty 1

## 2017-02-27 MED ORDER — HYDROCODONE-ACETAMINOPHEN 5-325 MG PO TABS: 1.0000 | ORAL_TABLET | ORAL | 0 refills | Status: DC | PRN

## 2017-02-27 MED ORDER — ACETAMINOPHEN 10 MG/ML IV SOLN
INTRAVENOUS | Status: DC | PRN
  Administered 2017-02-27: 1000 mg via INTRAVENOUS

## 2017-02-27 MED ORDER — LIDOCAINE HCL (CARDIAC) 20 MG/ML IV SOLN
INTRAVENOUS | Status: DC | PRN
  Administered 2017-02-27: 100 mg via INTRAVENOUS

## 2017-02-27 MED ORDER — ACETAMINOPHEN 10 MG/ML IV SOLN
INTRAVENOUS | Status: AC
  Filled 2017-02-27: qty 100

## 2017-02-27 MED ORDER — FENTANYL CITRATE (PF) 100 MCG/2ML IJ SOLN
25.0000 ug | INTRAMUSCULAR | Status: DC | PRN
  Administered 2017-02-27: 25 ug via INTRAVENOUS

## 2017-02-27 MED ORDER — KETOROLAC TROMETHAMINE 30 MG/ML IJ SOLN
INTRAMUSCULAR | Status: AC
  Filled 2017-02-27: qty 1

## 2017-02-27 MED ORDER — SODIUM CHLORIDE 0.9 % IV SOLN: INTRAVENOUS | Status: DC

## 2017-02-27 MED ORDER — MIDAZOLAM HCL 2 MG/2ML IJ SOLN
INTRAMUSCULAR | Status: AC
  Filled 2017-02-27: qty 2

## 2017-02-27 MED ORDER — LIDOCAINE HCL (PF) 2 % IJ SOLN
INTRAMUSCULAR | Status: AC
  Filled 2017-02-27: qty 2

## 2017-02-27 MED ORDER — HYDROCODONE-ACETAMINOPHEN 5-325 MG PO TABS: 1.0000 | ORAL_TABLET | ORAL | Status: DC | PRN

## 2017-02-27 MED ORDER — ONDANSETRON HCL 4 MG PO TABS: 4.0000 mg | ORAL_TABLET | Freq: Four times a day (QID) | ORAL | Status: DC | PRN

## 2017-02-27 MED ORDER — BUPIVACAINE-EPINEPHRINE (PF) 0.5% -1:200000 IJ SOLN
INTRAMUSCULAR | Status: DC | PRN
  Administered 2017-02-27: 20 mL

## 2017-02-27 MED ORDER — PROPOFOL 10 MG/ML IV BOLUS
INTRAVENOUS | Status: AC
  Filled 2017-02-27: qty 20

## 2017-02-27 MED ORDER — FENTANYL CITRATE (PF) 100 MCG/2ML IJ SOLN
INTRAMUSCULAR | Status: AC
  Filled 2017-02-27: qty 2

## 2017-02-27 MED ORDER — PROPOFOL 10 MG/ML IV BOLUS
INTRAVENOUS | Status: DC | PRN
  Administered 2017-02-27: 200 mg via INTRAVENOUS

## 2017-02-27 MED ORDER — DEXAMETHASONE SODIUM PHOSPHATE 10 MG/ML IJ SOLN
INTRAMUSCULAR | Status: DC | PRN
  Administered 2017-02-27: 10 mg via INTRAVENOUS

## 2017-02-27 MED ORDER — LACTATED RINGERS IV SOLN
INTRAVENOUS | Status: DC
  Administered 2017-02-27 (×2): via INTRAVENOUS

## 2017-02-27 MED ORDER — METOCLOPRAMIDE HCL 10 MG PO TABS: 5.0000 mg | ORAL_TABLET | Freq: Three times a day (TID) | ORAL | Status: DC | PRN

## 2017-02-27 MED ORDER — FAMOTIDINE 20 MG PO TABS
ORAL_TABLET | ORAL | Status: AC
  Administered 2017-02-27: 20 mg via ORAL
  Filled 2017-02-27: qty 1

## 2017-02-27 MED ORDER — MIDAZOLAM HCL 2 MG/2ML IJ SOLN
INTRAMUSCULAR | Status: DC | PRN
  Administered 2017-02-27: 2 mg via INTRAVENOUS

## 2017-02-27 MED ORDER — FENTANYL CITRATE (PF) 100 MCG/2ML IJ SOLN
INTRAMUSCULAR | Status: DC | PRN
  Administered 2017-02-27: 100 ug via INTRAVENOUS

## 2017-02-27 SURGICAL SUPPLY — 28 items
BANDAGE ACE 4X5 VEL STRL LF (GAUZE/BANDAGES/DRESSINGS) IMPLANT
BANDAGE ELASTIC 4 LF NS (GAUZE/BANDAGES/DRESSINGS) ×2 IMPLANT
BLADE FULL RADIUS 3.5 (BLADE) IMPLANT
BLADE INCISOR PLUS 4.5 (BLADE) IMPLANT
BLADE SHAVER 4.5 DBL SERAT CV (CUTTER) IMPLANT
BLADE SHAVER 4.5X7 STR FR (MISCELLANEOUS) IMPLANT
CHLORAPREP W/TINT 26ML (MISCELLANEOUS) ×2 IMPLANT
CUFF TOURN 24 STER (MISCELLANEOUS) IMPLANT
CUFF TOURN 30 STER DUAL PORT (MISCELLANEOUS) IMPLANT
GAUZE SPONGE 4X4 12PLY STRL (GAUZE/BANDAGES/DRESSINGS) ×2 IMPLANT
GLOVE SURG SYN 9.0  PF PI (GLOVE) ×1
GLOVE SURG SYN 9.0 PF PI (GLOVE) ×1 IMPLANT
GOWN SRG 2XL LVL 4 RGLN SLV (GOWNS) ×1 IMPLANT
GOWN STRL NON-REIN 2XL LVL4 (GOWNS) ×1
GOWN STRL REUS W/ TWL LRG LVL3 (GOWN DISPOSABLE) ×2 IMPLANT
GOWN STRL REUS W/TWL LRG LVL3 (GOWN DISPOSABLE) ×2
IV LACTATED RINGER IRRG 3000ML (IV SOLUTION) ×2
IV LR IRRIG 3000ML ARTHROMATIC (IV SOLUTION) ×2 IMPLANT
KIT RM TURNOVER STRD PROC AR (KITS) ×2 IMPLANT
MANIFOLD NEPTUNE II (INSTRUMENTS) ×2 IMPLANT
PACK ARTHROSCOPY KNEE (MISCELLANEOUS) ×2 IMPLANT
SET TUBE SUCT SHAVER OUTFL 24K (TUBING) ×2 IMPLANT
SET TUBE TIP INTRA-ARTICULAR (MISCELLANEOUS) ×2 IMPLANT
SUT ETHILON 4-0 (SUTURE) ×1
SUT ETHILON 4-0 FS2 18XMFL BLK (SUTURE) ×1
SUTURE ETHLN 4-0 FS2 18XMF BLK (SUTURE) ×1 IMPLANT
TUBING ARTHRO INFLOW-ONLY STRL (TUBING) ×2 IMPLANT
WAND HAND CNTRL MULTIVAC 50 (MISCELLANEOUS) ×2 IMPLANT

## 2017-02-27 NOTE — Anesthesia Preprocedure Evaluation (Signed)
Anesthesia Evaluation  Patient identified by MRN, date of birth, ID band Patient awake    Reviewed: Allergy & Precautions, NPO status , Patient's Chart, lab work & pertinent test results, reviewed documented beta blocker date and time   Airway Mallampati: III  TM Distance: >3 FB     Dental  (+) Chipped   Pulmonary           Cardiovascular hypertension, Pt. on medications and Pt. on home beta blockers      Neuro/Psych    GI/Hepatic   Endo/Other    Renal/GU      Musculoskeletal   Abdominal   Peds  Hematology   Anesthesia Other Findings Obese.  Reproductive/Obstetrics                             Anesthesia Physical Anesthesia Plan  ASA: III  Anesthesia Plan: General   Post-op Pain Management:    Induction: Intravenous  Airway Management Planned: LMA  Additional Equipment:   Intra-op Plan:   Post-operative Plan:   Informed Consent: I have reviewed the patients History and Physical, chart, labs and discussed the procedure including the risks, benefits and alternatives for the proposed anesthesia with the patient or authorized representative who has indicated his/her understanding and acceptance.     Plan Discussed with: CRNA  Anesthesia Plan Comments:         Anesthesia Quick Evaluation

## 2017-02-27 NOTE — H&P (Signed)
Reviewed paper H+P, will be scanned into chart.Pateinet exmained. No changes noted.

## 2017-02-27 NOTE — Op Note (Signed)
02/27/2017  10:00 AM  PATIENT:  Vincent Clements  56 y.o. male  PRE-OPERATIVE DIAGNOSIS:  derangement of posterior of medial meniscus due to old tear or injury   POST-OPERATIVE DIAGNOSIS:  derangement of posterior of medial meniscus due to old tear or injury and lateral meniscus tear  PROCEDURE:  Procedure(s): right knee arthroscopy, partial medial and lateral menisectomy (Right)  SURGEON: Laurene Footman, MD  ASSISTANTS: None  ANESTHESIA:   general  EBL:  Total I/O In: -  Out: 2 [Blood:2]  BLOOD ADMINISTERED:none  DRAINS: none   LOCAL MEDICATIONS USED:  MARCAINE     SPECIMEN:  No Specimen  DISPOSITION OF SPECIMEN:  N/A  COUNTS:  YES  TOURNIQUET:    IMPLANTS: None  DICTATION: .Dragon Dictation patient brought the operating room and after adequate anesthesia was obtained the right leg was prepped and draped in sterile fashion with a tourniquet and arthroscopic leg holder applied to the right thigh. After patient identification and timeout procedures were completed, an inferior lateral portal was made and the arthroscope introduced. Inspection revealed normal-appearing patellofemoral joint with very minimal chondromalacia essentially normal patellofemoral him around medially and inferior medial portal was made on probing there was a flap tear of the junction of the middle and posterior thirds extending into the posterior third approximately the inner third of the meniscus. This could be displaced into the joint. This was subsequently debrided with ArthroCare wand getting back to a stable margin articular cartilage was nearly normal. The anterior cruciate ligament was intact and the lateral compartment there is a similar but smaller flap tear of the lateral meniscus in the middle third a could be displaced into the joint this is also ablated with the wand to a stable margin the gutters were free of any loose body and after thorough irrigation of the joint argentation was withdrawn.  Pre-and post procedure pictures have been taken. The wounds were closed with 4-0 nylon in a simple interrupted fashion and infiltrated with 10 cc half percent Sensorcaine to each portal. Xeroform 4 x 4 web roll and Ace wrap applied  PLAN OF CARE: Discharge to home after PACU  PATIENT DISPOSITION:  PACU - hemodynamically stable.

## 2017-02-27 NOTE — Discharge Instructions (Addendum)
Take pain medicine as directed. Minimal activity this weekend. If bandage slides down leg, remove entire bandage put 2 Band-Aids on and reapply Ace wrap only. Transfer 5 mg daily until walking more normally   AMBULATORY SURGERY  DISCHARGE INSTRUCTIONS   1) The drugs that you were given will stay in your system until tomorrow so for the next 24 hours you should not:  A) Drive an automobile B) Make any legal decisions C) Drink any alcoholic beverage   2) You may resume regular meals tomorrow.  Today it is better to start with liquids and gradually work up to solid foods.  You may eat anything you prefer, but it is better to start with liquids, then soup and crackers, and gradually work up to solid foods.   3) Please notify your doctor immediately if you have any unusual bleeding, trouble breathing, redness and pain at the surgery site, drainage, fever, or pain not relieved by medication.    4) Additional Instructions:        Please contact your physician with any problems or Same Day Surgery at [phone], Monday through Friday 6 am to 4 pm, or [place] at [facility] number at [phone].

## 2017-02-27 NOTE — Anesthesia Postprocedure Evaluation (Signed)
Anesthesia Post Note  Patient: Vincent Clements  Procedure(s) Performed: Procedure(s) (LRB): right knee arthroscopy, partial medial and lateral menisectomy (Right)  Patient location during evaluation: PACU Anesthesia Type: General Level of consciousness: awake and alert Pain management: pain level controlled Vital Signs Assessment: post-procedure vital signs reviewed and stable Respiratory status: spontaneous breathing, nonlabored ventilation, respiratory function stable and patient connected to nasal cannula oxygen Cardiovascular status: blood pressure returned to baseline and stable Postop Assessment: no signs of nausea or vomiting Anesthetic complications: no     Last Vitals:  Vitals:   02/27/17 1057 02/27/17 1143  BP: 134/88   Pulse: 89 85  Resp: 16 16  Temp: 36.2 C     Last Pain:  Vitals:   02/27/17 1143  TempSrc:   PainSc: [place]

## 2017-02-27 NOTE — Anesthesia Post-op Follow-up Note (Cosign Needed)
Anesthesia QCDR form completed.        

## 2017-02-27 NOTE — Transfer of Care (Signed)
Immediate Anesthesia Transfer of Care Note  Patient: Vincent Clements  Procedure(s) Performed: Procedure(s): right knee arthroscopy, partial medial and lateral menisectomy (Right)  Patient Location: PACU  Anesthesia Type:General  Level of Consciousness: sedated  Airway & Oxygen Therapy: Patient Spontanous Breathing and Patient connected to face mask oxygen  Post-op Assessment: Report given to RN and Post -op Vital signs reviewed and stable  Post vital signs: Reviewed and stable  Last Vitals:  Vitals:   02/27/17 0739  BP: (!) 146/95  Pulse: 95  Resp: 20  Temp: 36.9 C    Last Pain:  Vitals:   02/27/17 0739  TempSrc: Oral  PainSc: 3       Patients Stated Pain Goal: 3 ([id number])  Complications: No apparent anesthesia complications

## 2017-02-27 NOTE — Anesthesia Procedure Notes (Signed)
Procedure Name: LMA Insertion Date/Time: 02/27/2017 9:25 AM Performed by: Nelda Marseille Pre-anesthesia Checklist: Patient identified, Patient being monitored, Timeout performed, Emergency Drugs available and Suction available Patient Re-evaluated:Patient Re-evaluated prior to inductionOxygen Delivery Method: Circle system utilized Preoxygenation: Pre-oxygenation with 100% oxygen Intubation Type: IV induction Ventilation: Mask ventilation without difficulty LMA: LMA inserted LMA Size: 4.5 Tube type: Oral Number of attempts: 1 Placement Confirmation: positive ETCO2 and breath sounds checked- equal and bilateral Tube secured with: Tape Dental Injury: Teeth and Oropharynx as per pre-operative assessment

## 2017-02-28 ENCOUNTER — Encounter: Payer: Self-pay | Admitting: Orthopedic Surgery

## 2017-03-19 ENCOUNTER — Telehealth: Payer: Self-pay | Admitting: *Deleted

## 2017-03-19 NOTE — Telephone Encounter (Signed)
Patient contacted to see if he was a Dr. Eliberto Ivory patient in old [place] office. We did not receive any notes as of yet. Release of information states chart ordered from storage.

## 2017-03-26 ENCOUNTER — Telehealth: Payer: Self-pay | Admitting: Family Medicine

## 2017-03-26 NOTE — Telephone Encounter (Signed)
Spoke with patient he would like paperwork faxed to [facility] for completion. He was informed ortho will have to clear him to return to work. His next appointment is 04/16/17.

## 2017-03-26 NOTE — Telephone Encounter (Signed)
Received a fax today from patient's insurance company [employer], with an "Attending Physician's Statement - Progress Report" to be completed. Hist last visit with me was 01/21/17 as new patient and FMLA form was completed for his Right Knee problem, he was referred to Orthopedics, and now has had surgery on 02/27/17.  He is not scheduled to follow-up with me until 05/02/17 regarding routine HTN follow-up. He is not scheduled for any follow-up on Right Knee problem. --------------------------------------------------  Please contact patient to notify him that we have received this form for hs insurance, requesting a progress update, and he would need to schedule an office visit with me to complete this form. I do not see any deadline, he may schedule at his convenience.  Nobie Putnam, [facility] 03/26/2017, 2:14 PM

## 2017-03-26 NOTE — Telephone Encounter (Signed)
Reviewed update, agree with faxing form to Ortho for completion since they are going to be the ones to clear him to return to work. Will stay tuned if any other paperwork needs to be completed.  Nobie Putnam, [facility] 03/26/2017, 4:42 PM

## 2017-04-16 DIAGNOSIS — M5136 Other intervertebral disc degeneration, lumbar region: Secondary | ICD-10-CM | POA: Diagnosis not present

## 2017-04-16 DIAGNOSIS — Z9889 Other specified postprocedural states: Secondary | ICD-10-CM | POA: Diagnosis not present

## 2017-04-23 DIAGNOSIS — M6281 Muscle weakness (generalized): Secondary | ICD-10-CM | POA: Diagnosis not present

## 2017-04-23 DIAGNOSIS — M5442 Lumbago with sciatica, left side: Secondary | ICD-10-CM | POA: Diagnosis not present

## 2017-04-29 DIAGNOSIS — M6281 Muscle weakness (generalized): Secondary | ICD-10-CM | POA: Diagnosis not present

## 2017-04-29 DIAGNOSIS — M5442 Lumbago with sciatica, left side: Secondary | ICD-10-CM | POA: Diagnosis not present

## 2017-05-01 DIAGNOSIS — M5442 Lumbago with sciatica, left side: Secondary | ICD-10-CM | POA: Diagnosis not present

## 2017-05-01 DIAGNOSIS — M6281 Muscle weakness (generalized): Secondary | ICD-10-CM | POA: Diagnosis not present

## 2017-05-02 ENCOUNTER — Ambulatory Visit (INDEPENDENT_AMBULATORY_CARE_PROVIDER_SITE_OTHER): Payer: BLUE CROSS/BLUE SHIELD | Admitting: Family Medicine

## 2017-05-02 ENCOUNTER — Encounter: Payer: Self-pay | Admitting: Family Medicine

## 2017-05-02 VITALS — BP 148/82 | HR 88 | Temp 98.2°F | Resp 16 | Ht 69.0 in | Wt 279.6 lb

## 2017-05-02 DIAGNOSIS — N521 Erectile dysfunction due to diseases classified elsewhere: Secondary | ICD-10-CM | POA: Diagnosis not present

## 2017-05-02 DIAGNOSIS — K635 Polyp of colon: Secondary | ICD-10-CM | POA: Insufficient documentation

## 2017-05-02 DIAGNOSIS — N529 Male erectile dysfunction, unspecified: Secondary | ICD-10-CM | POA: Insufficient documentation

## 2017-05-02 DIAGNOSIS — Z1211 Encounter for screening for malignant neoplasm of colon: Secondary | ICD-10-CM

## 2017-05-02 DIAGNOSIS — I10 Essential (primary) hypertension: Secondary | ICD-10-CM | POA: Diagnosis not present

## 2017-05-02 DIAGNOSIS — L918 Other hypertrophic disorders of the skin: Secondary | ICD-10-CM | POA: Diagnosis not present

## 2017-05-02 NOTE — Patient Instructions (Addendum)
Thank you for coming to the clinic today.  1.  Keep following with Dr Rudene Christians for your back as you continue to make progress. ----------------------------  For your Erectile Dysfunction, this is most likely related to your blood pressure medicine and heart issues, these can certainly contributed. - Please ask your Cardiologist Dr Clayborn Bigness next week about if he thinks your heart is healthy enough to take the Sildenafil or generic Viagra, if you are okay to proceed with this, I can print the rx for Sildenafil and you would take it as instructed, gradually increasing dose as tolerated. If you get significant chest pain or pressure or shortness of breath and sweating when you take it then please stop medicine immediately and go seek medical attention at hospital ED.  ---------------------------------------- [facility] [place], [place] [address] Phone: [phone]  Arin L. Kellie Moor, MD ? Kirkland Hun, MD  ------------------------- Discount generic Sildenafil pills $1 per [facility]   Address: [address] [place], [place] [address] Hours: 8:30AM-6:30PM Phone: [phone]  -------------------------------------  Colon Cancer Screening: - For all adults age 3+ routine colon cancer screening is highly recommended. - Early detection of colon cancer is important, because often there are no warning signs or symptoms, also if found early usually it can be cured. Late stage is hard to treat.  - If you are not interested in Colonoscopy screening (if done and normal you could be cleared for 5 to 10 years until next due), then Cologuard is an excellent alternative for screening test for Colon Cancer. It is highly sensitive for detecting DNA of colon cancer from even the earliest stages. Also, there is NO bowel prep required. - If Cologuard is NEGATIVE, then it is good for 3 years before next due - If Cologuard is POSITIVE, then it is strongly advised  to get a Colonoscopy, which allows the GI doctor to locate the source of the cancer or polyp (even very early stage) and treat it by removing it. ------------------------- If you would like to proceed with Cologuard (stool DNA test) - FIRST, call your insurance company and tell them you want to check cost of Cologuard tell them CPT Code [id number] (it may be completely covered and you could get for no cost, OR max cost without any coverage is about $600). Also, keep in mind if you do NOT open the kit, and decide not to do the test, you will NOT be charged, you should contact the company if you decide not to do the test. - If you want to proceed, you can notify us (phone message, [place], or at next visit) and we will order it for you. The test kit will be delivered to you house within about 1 week. Follow instructions to collect sample, you may call the company for any help or questions, 24/7 telephone support at [phone].   Please schedule a follow-up appointment with Dr. Parks Ranger in 6 month follow-up HTN, ED  If you have any other questions or concerns, please feel free to call the clinic or send a message through [place]. You may also schedule an earlier appointment if necessary.  Nobie Putnam, DO [place]

## 2017-05-02 NOTE — Progress Notes (Signed)
Subjective:    Patient ID: Vincent Clements, male    DOB: Apr 26, 1961, 56 y.o.   MRN: [id number]  Vincent Clements is a 56 y.o. male presenting on 05/02/2017 for Hypertension   HPI   Skin Tag Neck, Multiple - Reports chronic problem for several years with various skin tags on neck, face, and upper chest. These have become more prevalent within past >1 year, specifically several large skin tags on neck, one in beard, one on left side of neck. In past he had one removed surgically. He has family history of excessive skin tags as well, attributes this to age. One on his R clavicle region was "tugged" by his shirt and caused some pain and redness. - He is interested in surgical removal of large skin tag or asking about referral to Dermatology to assess these lesions - Denies any active pain or bleeding from skin tags, no other abnormal skin masses or abnormal moles  Erectile Dysfunction: - Reports chronic problem >6 months now, he had some problems before, but now significantly worse following start of carvedilol BB from Cardiology after dx systolic CHF with cardiomyopathy - Very limited erections now,, occasional partial erection, unable to maintain, no spontaneous nocturnal erections anymore - Has never been evaluated for this problem before and never taken any medications for ED. - Interested in sex with good desire - He has an appointment on Monday 5/7 with his Cardiologist Vincent Clements) - Denies testicular pain, swelling  CHRONIC HTN: Reports checks occasionally at home and at prior doctors visit ranging BP 130s/80s. Today he is in some pain with his low back recent injury, attributes elevated BP to this. Current Meds - Carvedilol 6.25mg  BID   Reports good compliance, took meds today. Tolerating well, w/o complaints. Lifestyle - Recent Right knee arthroscopic surgery 02/2017, he has been limited activity since, and has started to improve activity and exercise but had setback with his back  pain, now doing physical therapy. Followed by Vincent Clements ortho Vincent Clements Denies CP, dyspnea, HA, edema, dizziness / lightheadedness  COLON CANCER SCREENING: - Today reports he is interested in proceeding with colon cancer screening, he has done his research and would prefer cologuard screening test, instead of colonoscopy. He understands that it is a DNA screening test and if positive or abnormal would require colonoscopy - No known personal or family history of colon cancer - Denies any significant GI symptoms, change in bowel habits, constipation diarrhea, abdominal pain, dark stools or blood in stool, unintentional weight loss, night sweats  Health Maintenance: - Colon cancer screening, see above   Social History  Substance Use Topics  . Smoking status: Never Smoker  . Smokeless tobacco: Former Systems developer    Types: Chew, [place] date: 12/31/1991     Comment: pt quit chewing tobacco 01/11/2006  . Alcohol use Yes     Comment: occasionally, 1 per week    Review of Systems Per HPI unless specifically indicated above     Objective:    BP (!) 148/82 (BP Location: Left Arm, Cuff Size: Normal)   Pulse 88   Temp 98.2 F (36.8 C) (Oral)   Resp 16   Ht 5\' 9"  (1.753 m)   Wt 279 lb 9.6 oz (126.8 kg)   SpO2 96%   BMI 41.29 kg/m   Wt Readings from Last 3 Encounters:  05/02/17 279 lb 9.6 oz (126.8 kg)  02/27/17 265 lb (120.2 kg)  02/21/17 265 lb (120.2 kg)  Physical Exam  Constitutional: He appears well-developed and well-nourished. No distress.  Well-appearing, comfortable, cooperative, obese  HENT:  Head: Normocephalic and atraumatic.  Mouth/Throat: Oropharynx is clear and moist.  Genitourinary:  Genitourinary Comments: Declined external genital exam.  Neurological: He is alert.  Skin: Skin is warm and dry. No rash noted. He is not diaphoretic. No erythema.  Several skin tags along neck and upper chest bilaterally. One notable lesion Left mid lateral neck >1 cm large  pedunculated skin tag, without complication, soft mobile (see picture), other large skin tag in beard left side of neck/chin. And one other prominent larger skin tag R lower clavicle region with localized erythema surrounding it with evidence of inflammation. Otherwise extensive smaller skin tags and seborrheic keratoses.  Psychiatric: He has a normal mood and affect. His behavior is normal.  Nursing note and vitals reviewed.        Results for orders placed or performed during the hospital encounter of 40/08/67  Basic metabolic panel  Result Value Ref Range   Sodium 140 135 - 145 mmol/L   Potassium 4.1 3.5 - 5.1 mmol/L   Chloride 107 101 - 111 mmol/L   CO2 27 22 - 32 mmol/L   Glucose, Bld 123 (H) 65 - 99 mg/dL   BUN 21 (H) 6 - 20 mg/dL   Creatinine, Ser 0.70 0.61 - 1.24 mg/dL   Calcium 9.3 8.9 - 10.3 mg/dL   GFR calc non Af Amer >60 >60 mL/min   GFR calc Af Amer >60 >60 mL/min   Anion gap 6 5 - 15  Troponin I  Result Value Ref Range   Troponin I <0.03 <0.031 ng/mL  CBC  Result Value Ref Range   WBC 9.8 3.8 - 10.6 K/uL   RBC 5.85 4.40 - 5.90 MIL/uL   Hemoglobin 17.1 13.0 - 18.0 g/dL   HCT 50.5 40.0 - 52.0 %   MCV 86.3 80.0 - 100.0 fL   MCH 29.2 26.0 - 34.0 pg   MCHC 33.8 32.0 - 36.0 g/dL   RDW 13.6 11.5 - 14.5 %   Platelets 291 150 - 440 K/uL  Troponin I  Result Value Ref Range   Troponin I 0.03 <0.031 ng/mL  Fibrin derivatives D-Dimer  Result Value Ref Range   Fibrin derivatives D-dimer ([facility]) 333 0 - 499      Assessment & Plan:   Problem List Items Addressed This Visit    Screen for colon cancer    Due for routine colon cancer screening. Never had colonoscopy (not interested), no family history colon cancer. - Discussion today about recommendations for either Colonoscopy or Cologuard screening, benefits and risks of screening, interested in Cologuard, understands that if positive then recommendation is for diagnostic colonoscopy to follow-up.  - Patient advised to  contact insurance first to learn cost, will notify us when ready for [place] to order Cologuard      Multiple acquired skin tags - Primary    Excessive amount of various sized skin tags head/neck, upper chest, some with inflammation after being traumatized by shirt and other movements. - No obvious abnormal moles or other skin lesions concerning for malignancy, but given amount of skin lesions patient has, would benefit from more complete skin evaluation - Defer decision to excise the largest skin tag today, due to limited hemostasis options in office today - Referral to [facility] for evaluation and removal of skin tags, complete skin evaulation      Relevant Orders   Ambulatory referral to  Dermatology   Hypertension    Elevated BP, still high on manual re-check, outside readings improved, likely attribute to back pain today. However on minimal BP regimen may need to be adjusted in near future No known complication  Plan: 1. Continue carvedilol 6.25mg  BID for now - already has significant ED seems BP meds contributing, agree to hold off on adding another med today, he may discuss with Cardiology next week 2. Encourage improve lifestyle resume exercise now s/p knee surgery recovered, and adjust diet, to help control BP 3. Follow-up 6 months, home monitoring before, if >140/90 return sooner      Erectile dysfunction    Consistent with multifactorial ED, seems worse following cardiomyopathy, started anti-HTN BB therapy, and probably age-related ED - no prior trial on PDE5 inhibitors  Plan: 1. Given his cardiac history, advised that he should review trial on PDE5 with his Cardiology Vincent Vincent Bigness [facility]) next week as scheduled, if cleared to try these meds, will print rx trial on generic Sildenafil 20mg  tabs, take 1-5 tabs about 30 min prior to sexual activity and he may pick it up at office, to bring to [employer] for discount price, cautioned on side effects and safety if gets chest  pain, pressure, sweating, dyspnea needs to stop and immediately seek medical attention       Other Visit Diagnoses    Inflamed skin tag       Relevant Orders   Ambulatory referral to Dermatology      No orders of the defined types were placed in this encounter.   Follow up plan: Return in about 6 months (around 11/02/2017) for blood pressure.  Nobie Putnam, [facility] 05/03/2017, 7:19 AM

## 2017-05-03 NOTE — Assessment & Plan Note (Signed)
Elevated BP, still high on manual re-check, outside readings improved, likely attribute to back pain today. However on minimal BP regimen may need to be adjusted in near future No known complication  Plan: 1. Continue carvedilol 6.25mg  BID for now - already has significant ED seems BP meds contributing, agree to hold off on adding another med today, he may discuss with Cardiology next week 2. Encourage improve lifestyle resume exercise now s/p knee surgery recovered, and adjust diet, to help control BP 3. Follow-up 6 months, home monitoring before, if >140/90 return sooner

## 2017-05-03 NOTE — Assessment & Plan Note (Signed)
Due for routine colon cancer screening. Never had colonoscopy (not interested), no family history colon cancer. - Discussion today about recommendations for either Colonoscopy or Cologuard screening, benefits and risks of screening, interested in Cologuard, understands that if positive then recommendation is for diagnostic colonoscopy to follow-up.  - Patient advised to contact insurance first to learn cost, will notify us when ready for us to order Cologuard 

## 2017-05-03 NOTE — Assessment & Plan Note (Signed)
Consistent with multifactorial ED, seems worse following cardiomyopathy, started anti-HTN BB therapy, and probably age-related ED - no prior trial on PDE5 inhibitors  Plan: 1. Given his cardiac history, advised that he should review trial on PDE5 with his Cardiology Dr Clayborn Bigness [facility]) next week as scheduled, if cleared to try these meds, will print rx trial on generic Sildenafil 20mg  tabs, take 1-5 tabs about 30 min prior to sexual activity and he may pick it up at office, to bring to [employer] for discount price, cautioned on side effects and safety if gets chest pain, pressure, sweating, dyspnea needs to stop and immediately seek medical attention

## 2017-05-03 NOTE — Assessment & Plan Note (Signed)
Excessive amount of various sized skin tags head/neck, upper chest, some with inflammation after being traumatized by shirt and other movements. - No obvious abnormal moles or other skin lesions concerning for malignancy, but given amount of skin lesions patient has, would benefit from more complete skin evaluation - Defer decision to excise the largest skin tag today, due to limited hemostasis options in office today - Referral to [facility] for evaluation and removal of skin tags, complete skin evaulation

## 2017-05-05 DIAGNOSIS — M5442 Lumbago with sciatica, left side: Secondary | ICD-10-CM | POA: Diagnosis not present

## 2017-05-05 DIAGNOSIS — M6281 Muscle weakness (generalized): Secondary | ICD-10-CM | POA: Diagnosis not present

## 2017-05-06 DIAGNOSIS — M5136 Other intervertebral disc degeneration, lumbar region: Secondary | ICD-10-CM | POA: Diagnosis not present

## 2017-05-12 DIAGNOSIS — I429 Cardiomyopathy, unspecified: Secondary | ICD-10-CM | POA: Diagnosis not present

## 2017-05-12 DIAGNOSIS — I10 Essential (primary) hypertension: Secondary | ICD-10-CM | POA: Diagnosis not present

## 2017-05-12 DIAGNOSIS — R000 Tachycardia, unspecified: Secondary | ICD-10-CM | POA: Diagnosis not present

## 2017-05-12 DIAGNOSIS — R0602 Shortness of breath: Secondary | ICD-10-CM | POA: Diagnosis not present

## 2017-05-16 DIAGNOSIS — M25652 Stiffness of left hip, not elsewhere classified: Secondary | ICD-10-CM | POA: Diagnosis not present

## 2017-05-16 DIAGNOSIS — M5136 Other intervertebral disc degeneration, lumbar region: Secondary | ICD-10-CM | POA: Diagnosis not present

## 2017-05-22 DIAGNOSIS — M1612 Unilateral primary osteoarthritis, left hip: Secondary | ICD-10-CM | POA: Diagnosis not present

## 2017-05-30 DIAGNOSIS — M1612 Unilateral primary osteoarthritis, left hip: Secondary | ICD-10-CM | POA: Diagnosis not present

## 2017-05-30 DIAGNOSIS — M533 Sacrococcygeal disorders, not elsewhere classified: Secondary | ICD-10-CM | POA: Diagnosis not present

## 2017-06-06 ENCOUNTER — Ambulatory Visit (INDEPENDENT_AMBULATORY_CARE_PROVIDER_SITE_OTHER): Payer: BLUE CROSS/BLUE SHIELD | Admitting: Family Medicine

## 2017-06-06 ENCOUNTER — Encounter: Payer: Self-pay | Admitting: Family Medicine

## 2017-06-06 VITALS — BP 134/84 | HR 103 | Temp 98.3°F | Resp 16 | Ht 69.0 in | Wt 283.0 lb

## 2017-06-06 DIAGNOSIS — J3089 Other allergic rhinitis: Secondary | ICD-10-CM | POA: Diagnosis not present

## 2017-06-06 DIAGNOSIS — I10 Essential (primary) hypertension: Secondary | ICD-10-CM

## 2017-06-06 DIAGNOSIS — R05 Cough: Secondary | ICD-10-CM | POA: Diagnosis not present

## 2017-06-06 DIAGNOSIS — R053 Chronic cough: Secondary | ICD-10-CM

## 2017-06-06 MED ORDER — BENZONATATE 100 MG PO CAPS: 100.0000 mg | ORAL_CAPSULE | Freq: Three times a day (TID) | ORAL | 0 refills | Status: DC | PRN

## 2017-06-06 MED ORDER — FLUTICASONE PROPIONATE 50 MCG/ACT NA SUSP: 2.0000 | Freq: Every day | NASAL | 3 refills | Status: DC

## 2017-06-06 NOTE — Patient Instructions (Addendum)
Thank you for coming to the clinic today.  1. It sounds like you have persistent Sinus Congestion or "Rhinosinusitis" - I do not think that this is a Bacterial Sinus Infection. Usually these are caused by Viruses or Allergies. Causing irritation of throat and lungs and triggering cough - No antibiotics are needed  Start OTC Loratadine (Claritin) 10mg  daily and New rx Flonase 2 sprays in each nostril daily for next 4-6 weeks, then you may stop and use seasonally or as needed  Start Tessalon Perls take 1 capsule up to 3 times a day as needed for cough  Try OTC Mucinex-DM  1-2 times daily for 7-10 days.  - Recommend to may also using Nasal Saline spray multiple times a day to help flush out congestion and clear sinuses  - Improve hydration by drinking plenty of clear fluids (water, gatorade) to reduce secretions and thin congestion  - Congestion draining down throat can cause irritation. May try warm herbal tea with honey, cough drops  If you develop persistent fever >101F for at least 3 consecutive days, headaches with sinus pain or pressure or persistent earache, please schedule a follow-up evaluation within next few days to week.  Please schedule a Follow-up Appointment to: Return in about 2 weeks (around 06/20/2017), or if symptoms worsen or fail to improve, for cough if not improved.  If you have any other questions or concerns, please feel free to call the clinic or send a message through [place]. You may also schedule an earlier appointment if necessary.  Nobie Putnam, DO [place]

## 2017-06-06 NOTE — Assessment & Plan Note (Signed)
Moderately elevated initial BP, repeat manual check improved. May be mildly elevated with sinus pressure and cough - Outside office readings variable to improved - ACEi cough (1 year ago off ACEi) Complication with Systolic CHF NICM   Plan:  1. Continue current BP regimen - Carvedilol 12.5mg  BID (recently increased by Dr Clayborn Bigness Cards), Losartan 25mg  2. Encourage improved lifestyle - low sodium diet, regular exercise 3. Continue monitor BP outside office, bring readings to next visit, if persistently >140/90 or new symptoms notify office sooner 4. Follow-up as needed q 3-6 months

## 2017-06-06 NOTE — Progress Notes (Signed)
Subjective:    Patient ID: Vincent Clements, male    DOB: Feb 23, 1961, 56 y.o.   MRN: [id number]  Vincent Clements is a 56 y.o. male presenting on 06/06/2017 for Cough (onset couple of days worst at night sometime gets whizzing sound)  Patient presents for a same day appointment.  HPI  COUGH, Persistent / Headache - Reports symptoms started about 1 month ago with persistent dry cough, worse at night. Denies any actual cold symptoms associated, maybe has some sinus allergy problems. - No OTC meds - tried cough drop last night with some relief - History of ACEi cough on Lisinopril about 1 year ago, switched to Losartan, has not had problem since. - S/p TDap in 2013, has new grandchild due in future. - Admits sinus pressure headache frontal bilateral - Denies productive cough, fevers/chills, congestion, ear pain or pressure, nausea, vomiting, sweats, hemoptysis, vision changes, numbness, weakness, tingling  CHRONIC HTN: Reports checks occasionally at home 130-140s/80s. Has episodes of higher BP. He has called Cardiology recently and they said to come in to our office. Today elevated BP initially. Current Meds - Carvedilol 12.5mg  BID (using 6.25mg  tabs x 2 per dose BID) - states Cardiology has sent a new rx 1 month ago for 12.5mg  BID, Losartan 25mg  daily Reports good compliance, took meds today. Tolerating well, w/o complaints. Denies CP, dyspnea, edema, dizziness / lightheadedness  Social History  Substance Use Topics  . Smoking status: Never Smoker  . Smokeless tobacco: Former Systems developer    Types: Chew, [place] date: 12/31/1991     Comment: pt quit chewing tobacco 01/11/2006  . Alcohol use Yes     Comment: occasionally, 1 per week    Review of Systems Per HPI unless specifically indicated above     Objective:    BP 134/84 (BP Location: Left Arm, Cuff Size: Normal)   Pulse (!) 103   Temp 98.3 F (36.8 C) (Oral)   Resp 16   Ht 5\' 9"  (1.753 m)   Wt 283 lb (128.4 kg)   SpO2 95%   BMI  41.79 kg/m   Wt Readings from Last 3 Encounters:  06/06/17 283 lb (128.4 kg)  05/02/17 279 lb 9.6 oz (126.8 kg)  02/27/17 265 lb (120.2 kg)    Physical Exam  Constitutional: He is oriented to person, place, and time. He appears well-developed and well-nourished. No distress.  Well-appearing, comfortable, cooperative, obese  HENT:  Head: Normocephalic and atraumatic.  Mouth/Throat: Oropharynx is clear and moist.  Frontal bilateral sinus very minimal tenderness / maxillary sinuses non-tender. Nares with moderate turbinate edema with mild congestion without purulence. Bilateral TMs clear with mild fullness without erythema, effusion or bulging. Oropharynx clear without erythema, exudates, edema or asymmetry.  Eyes: Conjunctivae are normal. Right eye exhibits no discharge. Left eye exhibits no discharge.  Neck: Normal range of motion. Neck supple.  Cardiovascular: Normal rate, regular rhythm, normal heart sounds and intact distal pulses.   No murmur heard. Pulmonary/Chest: Effort normal and breath sounds normal. No respiratory distress. He has no wheezes. He has no rales.  Good air movement. Speaks full sentences. No coughing.  Musculoskeletal: He exhibits no edema.  Lymphadenopathy:    He has no cervical adenopathy.  Neurological: He is alert and oriented to person, place, and time.  Skin: Skin is warm and dry. No rash noted. He is not diaphoretic. No erythema.  Psychiatric: He has a normal mood and affect. His behavior is normal.  Nursing note  and vitals reviewed.  Results for orders placed or performed during the hospital encounter of 32/20/25  Basic metabolic panel  Result Value Ref Range   Sodium 140 135 - 145 mmol/L   Potassium 4.1 3.5 - 5.1 mmol/L   Chloride 107 101 - 111 mmol/L   CO2 27 22 - 32 mmol/L   Glucose, Bld 123 (H) 65 - 99 mg/dL   BUN 21 (H) 6 - 20 mg/dL   Creatinine, Ser 0.70 0.61 - 1.24 mg/dL   Calcium 9.3 8.9 - 10.3 mg/dL   GFR calc non Af Amer >60 >60 mL/min    GFR calc Af Amer >60 >60 mL/min   Anion gap 6 5 - 15  Troponin I  Result Value Ref Range   Troponin I <0.03 <0.031 ng/mL  CBC  Result Value Ref Range   WBC 9.8 3.8 - 10.6 K/uL   RBC 5.85 4.40 - 5.90 MIL/uL   Hemoglobin 17.1 13.0 - 18.0 g/dL   HCT 50.5 40.0 - 52.0 %   MCV 86.3 80.0 - 100.0 fL   MCH 29.2 26.0 - 34.0 pg   MCHC 33.8 32.0 - 36.0 g/dL   RDW 13.6 11.5 - 14.5 %   Platelets 291 150 - 440 K/uL  Troponin I  Result Value Ref Range   Troponin I 0.03 <0.031 ng/mL  Fibrin derivatives D-Dimer  Result Value Ref Range   Fibrin derivatives D-dimer ([facility]) 333 0 - 499      Assessment & Plan:   Problem List Items Addressed This Visit    Hypertension - Primary    Moderately elevated initial BP, repeat manual check improved. May be mildly elevated with sinus pressure and cough - Outside office readings variable to improved - ACEi cough (1 year ago off ACEi) Complication with Systolic CHF NICM   Plan:  1. Continue current BP regimen - Carvedilol 12.5mg  BID (recently increased by Dr Clayborn Bigness Cards), Losartan 25mg  2. Encourage improved lifestyle - low sodium diet, regular exercise 3. Continue monitor BP outside office, bring readings to next visit, if persistently >140/90 or new symptoms notify office sooner 4. Follow-up as needed q 3-6 months      Relevant Medications   losartan (COZAAR) 25 MG tablet    Other Visit Diagnoses    Seasonal allergic rhinitis due to other allergic trigger       Relevant Medications   fluticasone (FLONASE) 50 MCG/ACT nasal spray   Cough, persistent      Suspected persistent non productive cough for >1 month likely etiology with allergy vs rhinosinusitis, less likely bronchitis vs infectious etiology given exam and history. No medications to trigger, off ACEi >1 year now. - Well appearing and afebrile. No focal lung abnormality. No wheezing. No known COPD or asthma. O2 95%  Plan: 1. Hold off on antibiotics today 2. Start nasal steroid Flonase 2  sprays in each nostril daily for 4-6 weeks, may repeat course seasonally or as needed 3. Start Tessalon Perls take 1 capsule up to 3 times a day as needed for cough 4. Start Loratadine 10mg  daily OTC 5. May take Mucinex-DM up to 7-10 days 6. Drink plenty of fluids 7. Follow-up sooner if not improved, return criteria given - may consider Z-pak vs Prednisone for bronchitis if significantly worsens or chest x-ray    Relevant Medications   benzonatate (TESSALON) 100 MG capsule      Meds ordered this encounter  Medications  . losartan (COZAAR) 25 MG tablet    Sig: Take  by mouth.  . benzonatate (TESSALON) 100 MG capsule    Sig: Take 1 capsule (100 mg total) by mouth 3 (three) times daily as needed for cough.    Dispense:  30 capsule    Refill:  0  . fluticasone (FLONASE) 50 MCG/ACT nasal spray    Sig: Place 2 sprays into both nostrils daily. Use for 4-6 weeks then stop and use seasonally or as needed.    Dispense:  16 g    Refill:  3      Follow up plan: Return in about 2 weeks (around 06/20/2017), or if symptoms worsen or fail to improve, for cough if not improved.  Nobie Putnam, [facility] 06/06/2017, 11:53 AM

## 2017-06-10 DIAGNOSIS — M461 Sacroiliitis, not elsewhere classified: Secondary | ICD-10-CM | POA: Diagnosis not present

## 2017-06-23 DIAGNOSIS — M533 Sacrococcygeal disorders, not elsewhere classified: Secondary | ICD-10-CM | POA: Diagnosis not present

## 2017-06-23 DIAGNOSIS — M1612 Unilateral primary osteoarthritis, left hip: Secondary | ICD-10-CM | POA: Diagnosis not present

## 2017-06-25 ENCOUNTER — Other Ambulatory Visit: Payer: Self-pay | Admitting: Orthopedic Surgery

## 2017-06-25 DIAGNOSIS — M533 Sacrococcygeal disorders, not elsewhere classified: Secondary | ICD-10-CM

## 2017-06-25 DIAGNOSIS — M1612 Unilateral primary osteoarthritis, left hip: Secondary | ICD-10-CM

## 2017-06-30 ENCOUNTER — Ambulatory Visit
Admission: RE | Admit: 2017-06-30 | Discharge: 2017-06-30 | Disposition: A | Discharge status: Home / Self Care | Payer: BLUE CROSS/BLUE SHIELD | Source: Ambulatory Visit | Attending: Orthopedic Surgery | Admitting: Orthopedic Surgery

## 2017-06-30 DIAGNOSIS — M545 Low back pain: Secondary | ICD-10-CM | POA: Diagnosis not present

## 2017-06-30 DIAGNOSIS — M533 Sacrococcygeal disorders, not elsewhere classified: Secondary | ICD-10-CM

## 2017-06-30 DIAGNOSIS — M4807 Spinal stenosis, lumbosacral region: Secondary | ICD-10-CM | POA: Insufficient documentation

## 2017-06-30 DIAGNOSIS — M1612 Unilateral primary osteoarthritis, left hip: Secondary | ICD-10-CM | POA: Diagnosis not present

## 2017-07-04 DIAGNOSIS — S32050D Wedge compression fracture of fifth lumbar vertebra, subsequent encounter for fracture with routine healing: Secondary | ICD-10-CM | POA: Diagnosis not present

## 2017-07-04 DIAGNOSIS — M9973 Connective tissue and disc stenosis of intervertebral foramina of lumbar region: Secondary | ICD-10-CM | POA: Diagnosis not present

## 2017-07-10 ENCOUNTER — Other Ambulatory Visit: Payer: Self-pay | Admitting: Orthopedic Surgery

## 2017-07-10 DIAGNOSIS — M9973 Connective tissue and disc stenosis of intervertebral foramina of lumbar region: Secondary | ICD-10-CM

## 2017-07-11 ENCOUNTER — Ambulatory Visit
Admission: RE | Admit: 2017-07-11 | Discharge: 2017-07-11 | Disposition: A | Discharge status: Home / Self Care | Payer: BLUE CROSS/BLUE SHIELD | Source: Ambulatory Visit | Attending: Orthopedic Surgery | Admitting: Orthopedic Surgery

## 2017-07-11 DIAGNOSIS — M9973 Connective tissue and disc stenosis of intervertebral foramina of lumbar region: Secondary | ICD-10-CM

## 2017-07-11 DIAGNOSIS — M47817 Spondylosis without myelopathy or radiculopathy, lumbosacral region: Secondary | ICD-10-CM | POA: Diagnosis not present

## 2017-07-11 MED ORDER — METHYLPREDNISOLONE ACETATE 40 MG/ML INJ SUSP (RADIOLOG
120.0000 mg | Freq: Once | INTRAMUSCULAR | Status: AC
  Administered 2017-07-11: 120 mg via EPIDURAL

## 2017-07-11 MED ORDER — IOPAMIDOL (ISOVUE-M 200) INJECTION 41%
1.0000 mL | Freq: Once | INTRAMUSCULAR | Status: AC
  Administered 2017-07-11: 1 mL via EPIDURAL

## 2017-07-11 NOTE — Discharge Instructions (Signed)

## 2017-07-23 DIAGNOSIS — M9973 Connective tissue and disc stenosis of intervertebral foramina of lumbar region: Secondary | ICD-10-CM | POA: Diagnosis not present

## 2017-07-29 DIAGNOSIS — M545 Low back pain: Secondary | ICD-10-CM | POA: Diagnosis not present

## 2017-07-29 DIAGNOSIS — G8929 Other chronic pain: Secondary | ICD-10-CM | POA: Diagnosis not present

## 2017-07-30 DIAGNOSIS — M533 Sacrococcygeal disorders, not elsewhere classified: Secondary | ICD-10-CM | POA: Diagnosis not present

## 2017-07-30 DIAGNOSIS — M1612 Unilateral primary osteoarthritis, left hip: Secondary | ICD-10-CM | POA: Diagnosis not present

## 2017-07-30 DIAGNOSIS — Z6841 Body Mass Index (BMI) 40.0 and over, adult: Secondary | ICD-10-CM | POA: Diagnosis not present

## 2017-07-30 DIAGNOSIS — M25652 Stiffness of left hip, not elsewhere classified: Secondary | ICD-10-CM | POA: Diagnosis not present

## 2017-08-04 DIAGNOSIS — M6281 Muscle weakness (generalized): Secondary | ICD-10-CM | POA: Diagnosis not present

## 2017-08-04 DIAGNOSIS — M25652 Stiffness of left hip, not elsewhere classified: Secondary | ICD-10-CM | POA: Diagnosis not present

## 2017-08-04 DIAGNOSIS — M533 Sacrococcygeal disorders, not elsewhere classified: Secondary | ICD-10-CM | POA: Diagnosis not present

## 2017-08-11 ENCOUNTER — Other Ambulatory Visit: Payer: Self-pay

## 2017-08-11 DIAGNOSIS — M25652 Stiffness of left hip, not elsewhere classified: Secondary | ICD-10-CM | POA: Diagnosis not present

## 2017-08-11 DIAGNOSIS — J3089 Other allergic rhinitis: Secondary | ICD-10-CM

## 2017-08-11 DIAGNOSIS — M533 Sacrococcygeal disorders, not elsewhere classified: Secondary | ICD-10-CM | POA: Diagnosis not present

## 2017-08-11 DIAGNOSIS — M6281 Muscle weakness (generalized): Secondary | ICD-10-CM | POA: Diagnosis not present

## 2017-08-11 MED ORDER — FLUTICASONE PROPIONATE 50 MCG/ACT NA SUSP: 2.0000 | Freq: Every day | NASAL | 1 refills | Status: DC

## 2017-08-14 DIAGNOSIS — M25652 Stiffness of left hip, not elsewhere classified: Secondary | ICD-10-CM | POA: Diagnosis not present

## 2017-08-14 DIAGNOSIS — M5442 Lumbago with sciatica, left side: Secondary | ICD-10-CM | POA: Diagnosis not present

## 2017-08-14 DIAGNOSIS — M6281 Muscle weakness (generalized): Secondary | ICD-10-CM | POA: Diagnosis not present

## 2017-08-14 DIAGNOSIS — M533 Sacrococcygeal disorders, not elsewhere classified: Secondary | ICD-10-CM | POA: Diagnosis not present

## 2017-08-21 DIAGNOSIS — M25652 Stiffness of left hip, not elsewhere classified: Secondary | ICD-10-CM | POA: Diagnosis not present

## 2017-08-21 DIAGNOSIS — M533 Sacrococcygeal disorders, not elsewhere classified: Secondary | ICD-10-CM | POA: Diagnosis not present

## 2017-08-21 DIAGNOSIS — M6281 Muscle weakness (generalized): Secondary | ICD-10-CM | POA: Diagnosis not present

## 2017-08-28 DIAGNOSIS — L905 Scar conditions and fibrosis of skin: Secondary | ICD-10-CM | POA: Diagnosis not present

## 2017-08-28 DIAGNOSIS — L820 Inflamed seborrheic keratosis: Secondary | ICD-10-CM | POA: Diagnosis not present

## 2017-08-28 DIAGNOSIS — L918 Other hypertrophic disorders of the skin: Secondary | ICD-10-CM | POA: Diagnosis not present

## 2017-08-28 DIAGNOSIS — L919 Hypertrophic disorder of the skin, unspecified: Secondary | ICD-10-CM | POA: Diagnosis not present

## 2017-08-28 DIAGNOSIS — L821 Other seborrheic keratosis: Secondary | ICD-10-CM | POA: Diagnosis not present

## 2017-08-28 DIAGNOSIS — D485 Neoplasm of uncertain behavior of skin: Secondary | ICD-10-CM | POA: Diagnosis not present

## 2017-08-28 DIAGNOSIS — M6281 Muscle weakness (generalized): Secondary | ICD-10-CM | POA: Diagnosis not present

## 2017-08-28 DIAGNOSIS — M533 Sacrococcygeal disorders, not elsewhere classified: Secondary | ICD-10-CM | POA: Diagnosis not present

## 2017-08-28 DIAGNOSIS — M25652 Stiffness of left hip, not elsewhere classified: Secondary | ICD-10-CM | POA: Diagnosis not present

## 2017-09-04 DIAGNOSIS — M6281 Muscle weakness (generalized): Secondary | ICD-10-CM | POA: Diagnosis not present

## 2017-09-04 DIAGNOSIS — M533 Sacrococcygeal disorders, not elsewhere classified: Secondary | ICD-10-CM | POA: Diagnosis not present

## 2017-09-04 DIAGNOSIS — M25652 Stiffness of left hip, not elsewhere classified: Secondary | ICD-10-CM | POA: Diagnosis not present

## 2017-09-10 DIAGNOSIS — M1612 Unilateral primary osteoarthritis, left hip: Secondary | ICD-10-CM | POA: Diagnosis not present

## 2017-09-11 DIAGNOSIS — M25652 Stiffness of left hip, not elsewhere classified: Secondary | ICD-10-CM | POA: Diagnosis not present

## 2017-09-11 DIAGNOSIS — M6281 Muscle weakness (generalized): Secondary | ICD-10-CM | POA: Diagnosis not present

## 2017-09-11 DIAGNOSIS — M533 Sacrococcygeal disorders, not elsewhere classified: Secondary | ICD-10-CM | POA: Diagnosis not present

## 2017-09-18 DIAGNOSIS — M25652 Stiffness of left hip, not elsewhere classified: Secondary | ICD-10-CM | POA: Diagnosis not present

## 2017-09-18 DIAGNOSIS — M6281 Muscle weakness (generalized): Secondary | ICD-10-CM | POA: Diagnosis not present

## 2017-09-18 DIAGNOSIS — M533 Sacrococcygeal disorders, not elsewhere classified: Secondary | ICD-10-CM | POA: Diagnosis not present

## 2017-09-25 DIAGNOSIS — M533 Sacrococcygeal disorders, not elsewhere classified: Secondary | ICD-10-CM | POA: Diagnosis not present

## 2017-09-25 DIAGNOSIS — M25652 Stiffness of left hip, not elsewhere classified: Secondary | ICD-10-CM | POA: Diagnosis not present

## 2017-09-25 DIAGNOSIS — M6281 Muscle weakness (generalized): Secondary | ICD-10-CM | POA: Diagnosis not present

## 2017-10-02 DIAGNOSIS — M6281 Muscle weakness (generalized): Secondary | ICD-10-CM | POA: Diagnosis not present

## 2017-10-02 DIAGNOSIS — M533 Sacrococcygeal disorders, not elsewhere classified: Secondary | ICD-10-CM | POA: Diagnosis not present

## 2017-10-02 DIAGNOSIS — M25652 Stiffness of left hip, not elsewhere classified: Secondary | ICD-10-CM | POA: Diagnosis not present

## 2017-10-09 DIAGNOSIS — M25652 Stiffness of left hip, not elsewhere classified: Secondary | ICD-10-CM | POA: Diagnosis not present

## 2017-10-09 DIAGNOSIS — M6281 Muscle weakness (generalized): Secondary | ICD-10-CM | POA: Diagnosis not present

## 2017-10-13 DIAGNOSIS — L820 Inflamed seborrheic keratosis: Secondary | ICD-10-CM | POA: Diagnosis not present

## 2017-10-13 DIAGNOSIS — L821 Other seborrheic keratosis: Secondary | ICD-10-CM | POA: Diagnosis not present

## 2017-10-15 DIAGNOSIS — M1612 Unilateral primary osteoarthritis, left hip: Secondary | ICD-10-CM | POA: Diagnosis not present

## 2017-10-15 DIAGNOSIS — M5136 Other intervertebral disc degeneration, lumbar region: Secondary | ICD-10-CM | POA: Diagnosis not present

## 2017-10-15 DIAGNOSIS — M533 Sacrococcygeal disorders, not elsewhere classified: Secondary | ICD-10-CM | POA: Diagnosis not present

## 2017-10-23 DIAGNOSIS — M533 Sacrococcygeal disorders, not elsewhere classified: Secondary | ICD-10-CM | POA: Diagnosis not present

## 2017-10-23 DIAGNOSIS — M6281 Muscle weakness (generalized): Secondary | ICD-10-CM | POA: Diagnosis not present

## 2017-10-23 DIAGNOSIS — M5442 Lumbago with sciatica, left side: Secondary | ICD-10-CM | POA: Diagnosis not present

## 2017-10-30 DIAGNOSIS — I480 Paroxysmal atrial fibrillation: Secondary | ICD-10-CM | POA: Diagnosis not present

## 2017-10-30 DIAGNOSIS — I10 Essential (primary) hypertension: Secondary | ICD-10-CM | POA: Diagnosis not present

## 2017-10-30 DIAGNOSIS — E782 Mixed hyperlipidemia: Secondary | ICD-10-CM | POA: Diagnosis not present

## 2017-10-30 DIAGNOSIS — I2510 Atherosclerotic heart disease of native coronary artery without angina pectoris: Secondary | ICD-10-CM | POA: Diagnosis not present

## 2017-11-03 ENCOUNTER — Other Ambulatory Visit: Payer: Self-pay | Admitting: Family Medicine

## 2017-11-03 ENCOUNTER — Ambulatory Visit (INDEPENDENT_AMBULATORY_CARE_PROVIDER_SITE_OTHER): Payer: BLUE CROSS/BLUE SHIELD | Admitting: Family Medicine

## 2017-11-03 ENCOUNTER — Encounter: Payer: Self-pay | Admitting: Family Medicine

## 2017-11-03 VITALS — BP 138/82 | HR 83 | Temp 99.3°F | Resp 16 | Ht 69.0 in | Wt 294.0 lb

## 2017-11-03 DIAGNOSIS — R05 Cough: Secondary | ICD-10-CM | POA: Diagnosis not present

## 2017-11-03 DIAGNOSIS — Z125 Encounter for screening for malignant neoplasm of prostate: Secondary | ICD-10-CM

## 2017-11-03 DIAGNOSIS — R799 Abnormal finding of blood chemistry, unspecified: Secondary | ICD-10-CM

## 2017-11-03 DIAGNOSIS — Z0000 Encounter for general adult medical examination without abnormal findings: Secondary | ICD-10-CM

## 2017-11-03 DIAGNOSIS — R635 Abnormal weight gain: Secondary | ICD-10-CM

## 2017-11-03 DIAGNOSIS — R351 Nocturia: Secondary | ICD-10-CM

## 2017-11-03 DIAGNOSIS — Z6841 Body Mass Index (BMI) 40.0 and over, adult: Secondary | ICD-10-CM

## 2017-11-03 DIAGNOSIS — J309 Allergic rhinitis, unspecified: Secondary | ICD-10-CM | POA: Insufficient documentation

## 2017-11-03 DIAGNOSIS — N521 Erectile dysfunction due to diseases classified elsewhere: Secondary | ICD-10-CM

## 2017-11-03 DIAGNOSIS — J3089 Other allergic rhinitis: Secondary | ICD-10-CM | POA: Diagnosis not present

## 2017-11-03 DIAGNOSIS — Z1211 Encounter for screening for malignant neoplasm of colon: Secondary | ICD-10-CM

## 2017-11-03 DIAGNOSIS — L918 Other hypertrophic disorders of the skin: Secondary | ICD-10-CM

## 2017-11-03 DIAGNOSIS — I10 Essential (primary) hypertension: Secondary | ICD-10-CM

## 2017-11-03 DIAGNOSIS — Z114 Encounter for screening for human immunodeficiency virus [HIV]: Secondary | ICD-10-CM

## 2017-11-03 DIAGNOSIS — Z1159 Encounter for screening for other viral diseases: Secondary | ICD-10-CM

## 2017-11-03 DIAGNOSIS — R053 Chronic cough: Secondary | ICD-10-CM

## 2017-11-03 DIAGNOSIS — R7309 Other abnormal glucose: Secondary | ICD-10-CM

## 2017-11-03 MED ORDER — SILDENAFIL CITRATE 20 MG PO TABS: ORAL_TABLET | ORAL | 5 refills | Status: DC

## 2017-11-03 MED ORDER — BENZONATATE 100 MG PO CAPS: 100.0000 mg | ORAL_CAPSULE | Freq: Three times a day (TID) | ORAL | 0 refills | Status: DC | PRN

## 2017-11-03 NOTE — Assessment & Plan Note (Signed)
Due for routine colon cancer screening. Never had colonoscopy (not interested), no family history colon cancer. - Ins denied Cologuard - Referral to [facility] for Colonoscopy screening, prefer by end of 2018

## 2017-11-03 NOTE — Assessment & Plan Note (Signed)
Weight gain now +15 lbs in 6 months, sedentary lifestyle due to back/hip/knee ortho pain and problems. Currently in PT rehab to improve function. - Lifestyle is limited exercise, some improved diet but needs to continue to improve - Encouraged lifestyle changes and modifications towards goal

## 2017-11-03 NOTE — Assessment & Plan Note (Signed)
Resolved per [facility] Dr Arbutus Leas pathology Follow-up as needed

## 2017-11-03 NOTE — Progress Notes (Signed)
Subjective:    Patient ID: Vincent Clements, male    DOB: 1961-01-30, 56 y.o.   MRN: [id number]  Vincent Clements is a 56 y.o. male presenting on 11/03/2017 for Hypertension (obtw pt is congusted which is getting improved last week but has cough now )   HPI   CHRONIC HTN: - Last visit with me 06/06/17, for HTN, treated with continued current meds, see prior notes for background information. - Interval update with no new changes, followed by [facility] Cardiology, saw Dr Clayborn Bigness on 10/30/17 doing well with normal BP, no med adjust - Today patient reports he has no new concerns, thinks BP mild elevated at times in doctors office, home readings normal on BP cuff SBP 120-130s Current Meds - Carvedilol 12.5mg  BID, Losartan 25mg  daily Reports good compliance, took meds today. Tolerating well, w/o complaints. Denies CP, dyspnea, edema, dizziness / lightheadedness  Erectile Dysfunction: - Reports chronic problem >6-12 months now, discussed this previously 03/2017, symptoms worse after start Carvedilol BB for CHF, see prior note for background - Interval update: he discussed this with his Cardiologist Dr Clayborn Bigness and was advised that his heart was healthy to take PDE5 medications - He would like to do trial on generic viagra now, requesting new rx - Currently admits limited erections only partial occasionally, unable to maintain, without spontaneous nocturnal erections - Never on meds before - Interested in sex with good desire - He is not taking Nitrates and denies any chest pain or pressure - Denies testicular pain or swelling  Allergic Rhinitis / COUGH, Recurrent - Last visit with me 06/06/17, for same problem, treated with flonase and benzonatate and anti-histamine loratadine to treat allergic rhinitis first, since he remains off ACEi >1 year now, see prior notes for background information. - Interval update with improvement and resolution of cough few months ago - Today, now recent problem x 1 week with some  recurrence has a few episodes of tickle in back of throat and some congestion an drainage and cough, does not feel bad today, no other symptoms. - He stopped Flonase and Loratadine after a while, and now will resume - Tessalon helped before - He denies significant GERD symptoms, had this in past years ago, but now resolved only 1-2x rare episodes a year with some heartburn, unsure if silent reflux - Denies fevers/chills, sweats, productive cough, dyspnea, wheezing, hemoptysis, heartburn  NOCTURIA, possible mild BPH LUTS - Reports no prior concern with enlarged prostate before. No recent DRE. No recent PSA, see below for screening. Now complains of some nocturia, asking about prostate supplement and meds  AUA BPH Symptom Score over past 1 month 1. Sensation of not emptying bladder post void - 0 2. Urinate less than 2 hour after finish last void - 0 3. Start/Stop several times during void - 0 4. Difficult to postpone urination - 0 5. Weak urinary stream - 1 6. Push or strain urination - 0 7. Nocturia - 3 times  Total Score: 4 (Mild BPH symptoms)  Additional updates: - Due to concern with his back, he has sought out custom inoles for shoes, went to "[employer]" in [place], had custom insoles measured and made thought Left foot was the problem, and he has tried this some improvement so far - Regarding his back, he remains out of work for now, still followed by [facility], Dr Rudene Christians and proceeds with PT rehab, he may not return to work, it is currently TBD based on his improvement -  Regarding his skin tags / moles, he saw Dr Nehemiah Massed and had many skin tags removed,pathology was benign, only follow-up PRN  Health Maintenance: - Due for Shingrix vaccine, no prior zostavax, he will check with ins - UTD Flu Shot 10/13/17 - Due routine Hep C and HIV screening, will get with labs next time - Prostate CA Screening: No prior prostate CA screening. No recent PSA available. Currently  with some mild nocturia LUTS, see above. No known family history of prostate CA. Due for screening will get PSA with next lab draw for physical in 2019 - Colon CA Screening: Never had colonoscopy, he attempted to get Cologuard covered but it was declined by his insurance. Currently asymptomatic. No known family history of colon CA. Due for screening test new referral to GI for colonoscopy   Depression screen [facility] 2/9 11/03/2017 01/21/2017  Decreased Interest 0 0  Down, Depressed, Hopeless 0 0  PHQ - 2 Score 0 0    Social History   Tobacco Use  . Smoking status: Never Smoker  . Smokeless tobacco: Former Systems developer    Types: Chew, Snuff  . Tobacco comment: pt quit chewing tobacco 01/11/2006  Substance Use Topics  . Alcohol use: Yes    Comment: occasionally, 1 per week  . Drug use: No    Review of Systems Per HPI unless specifically indicated above     Objective:    BP 138/82 (BP Location: Left Arm, Patient Position: Sitting, Cuff Size: Normal)   Pulse 83   Temp 99.3 F (37.4 C) (Oral)   Resp 16   Ht 5\' 9"  (1.753 m)   Wt 294 lb (133.4 kg)   BMI 43.42 kg/m   Wt Readings from Last 3 Encounters:  11/03/17 294 lb (133.4 kg)  06/06/17 283 lb (128.4 kg)  05/02/17 279 lb 9.6 oz (126.8 kg)    Physical Exam  Constitutional: He is oriented to person, place, and time. He appears well-developed and well-nourished. No distress.  Well-appearing, comfortable, cooperative, obese  HENT:  Head: Normocephalic and atraumatic.  Mouth/Throat: Oropharynx is clear and moist.  Frontal / maxillary sinuses non-tender. Nares patent without purulence or edema. Oropharynx clear without erythema, exudates, edema or asymmetry.  Eyes: Conjunctivae are normal. Right eye exhibits no discharge. Left eye exhibits no discharge.  Neck: Normal range of motion. Neck supple. No thyromegaly present.  Cardiovascular: Normal rate, regular rhythm, normal heart sounds and intact distal pulses.  No murmur  heard. Pulmonary/Chest: Effort normal and breath sounds normal. No respiratory distress. He has no wheezes. He has no rales.  Abdominal: Soft. Bowel sounds are normal. He exhibits no distension. There is no tenderness.  Musculoskeletal: He exhibits no edema.  Lymphadenopathy:    He has no cervical adenopathy.  Neurological: He is alert and oriented to person, place, and time.  Skin: Skin is warm and dry. No rash noted. He is not diaphoretic. No erythema.  Psychiatric: He has a normal mood and affect. His behavior is normal.  Well groomed, good eye contact, normal speech and thoughts  Nursing note and vitals reviewed.  Results for orders placed or performed during the hospital encounter of 32/20/25  Basic metabolic panel  Result Value Ref Range   Sodium 140 135 - 145 mmol/L   Potassium 4.1 3.5 - 5.1 mmol/L   Chloride 107 101 - 111 mmol/L   CO2 27 22 - 32 mmol/L   Glucose, Bld 123 (H) 65 - 99 mg/dL   BUN 21 (H) 6 - 20  mg/dL   Creatinine, Ser 0.70 0.61 - 1.24 mg/dL   Calcium 9.3 8.9 - 10.3 mg/dL   GFR calc non Af Amer >60 >60 mL/min   GFR calc Af Amer >60 >60 mL/min   Anion gap 6 5 - 15  Troponin I  Result Value Ref Range   Troponin I <0.03 <0.031 ng/mL  CBC  Result Value Ref Range   WBC 9.8 3.8 - 10.6 K/uL   RBC 5.85 4.40 - 5.90 MIL/uL   Hemoglobin 17.1 13.0 - 18.0 g/dL   HCT 50.5 40.0 - 52.0 %   MCV 86.3 80.0 - 100.0 fL   MCH 29.2 26.0 - 34.0 pg   MCHC 33.8 32.0 - 36.0 g/dL   RDW 13.6 11.5 - 14.5 %   Platelets 291 150 - 440 K/uL  Troponin I  Result Value Ref Range   Troponin I 0.03 <0.031 ng/mL  Fibrin derivatives D-Dimer  Result Value Ref Range   Fibrin derivatives D-dimer ([facility]) 333 0 - 499      Assessment & Plan:   Problem List Items Addressed This Visit    Allergic rhinitis due to allergen    Likely etiology for chronic recurrent rhinitis and cough, since improved already recently with treatment flonase, anti-histamine. Also trigger with dog at home possibly -  Repeat trial since stopped, Flonase regularly and Loratadine, may stop again in winter see if symptom recur      Erectile dysfunction    Consistent with multifactorial ED, seems worse following cardiomyopathy, started anti-HTN BB therapy, and probably age-related ED - no prior trial on PDE5 inhibitors - Cleared by Cardiology to try PDE5  Plan: 1. Printed rx trial on generic Sildenafil 20mg  tabs, take 1-5 tabs about 30 min prior to sexual activity, may bring to [employer] for discount price, cautioned on side effects and safety if gets chest pain, pressure, sweating, dyspnea needs to stop and immediately seek medical attention      Relevant Medications   sildenafil (REVATIO) 20 MG tablet   Hypertension - Primary    Mildly elevated initial BP - Outside office readings improved including Cardiology - ACEi cough (1 year ago off ACEi) Complication with Systolic CHF NICM Followed by [facility] Cardiology Dr Clayborn Bigness   Plan:  1. Continue current BP regimen - Carvedilol 12.5mg  BID,  Losartan 25mg  2. Encourage improved lifestyle - low sodium diet, regular exercise - weight loss 3. Continue monitor BP outside office, bring readings to next visit, if persistently >140/90 or new symptoms notify office sooner 4. Follow-up q 6 months Annual phys + labs      Relevant Medications   sildenafil (REVATIO) 20 MG tablet   carvedilol (COREG) 12.5 MG tablet   Morbid obesity with BMI of 40.0-44.9, adult (HCC)    Weight gain now +15 lbs in 6 months, sedentary lifestyle due to back/hip/knee ortho pain and problems. Currently in PT rehab to improve function. - Lifestyle is limited exercise, some improved diet but needs to continue to improve - Encouraged lifestyle changes and modifications towards goal      RESOLVED: Multiple acquired skin tags    Resolved per [facility] Dr Arbutus Leas pathology Follow-up as needed      Nocturia    Presumed mild BPH with LUTS vs nocturia - AUA BPH  score 4 (10/2017) - Never on meds - Last PSA not available - Last DRE years ago normal - No known personal/family history of prostate CA  Plan: 1. Recommend trial OTC  supplement super beta prostate vs saw palmetto dosing given, trial first since very mild symptoms - future consider flomax      Screen for colon cancer    Due for routine colon cancer screening. Never had colonoscopy (not interested), no family history colon cancer. - Ins denied Cologuard - Referral to [facility] for Colonoscopy screening, prefer by end of 2018      Relevant Orders   Ambulatory referral to Gastroenterology    Other Visit Diagnoses    Cough, persistent       Suspected still related to allergic rhinitis now with rebound symptoms, pulm exam normal today. Trial flonase, loratadine again, refill benzonatate   Relevant Medications   benzonatate (TESSALON) 100 MG capsule      Meds ordered this encounter  Medications  . loratadine (CLARITIN) 10 MG tablet    Sig: Take by mouth.  . sildenafil (REVATIO) 20 MG tablet    Sig: Take 1-5 pills about 30 min prior to sex. Start with 1 and increase as needed.    Dispense:  30 tablet    Refill:  5  . benzonatate (TESSALON) 100 MG capsule    Sig: Take 1 capsule (100 mg total) 3 (three) times daily as needed by mouth for cough.    Dispense:  30 capsule    Refill:  0  . carvedilol (COREG) 12.5 MG tablet    Sig: Take 12.5 mg 2 (two) times daily with a meal by mouth.    Follow up plan: Return in about 6 months (around 05/03/2018) for Annual Physical.  Future labs ordered for 05/12/18 including screening for Hepatitis C, HIV and PSA, also TSH added for obesity, abnormal weight gain and HTN.  Nobie Putnam, DO [facility] 11/03/2017, 6:16 PM

## 2017-11-03 NOTE — Assessment & Plan Note (Signed)
Presumed mild BPH with LUTS vs nocturia - AUA BPH score 4 (10/2017) - Never on meds - Last PSA not available - Last DRE years ago normal - No known personal/family history of prostate CA  Plan: 1. Recommend trial OTC supplement super beta prostate vs saw palmetto dosing given, trial first since very mild symptoms - future consider flomax

## 2017-11-03 NOTE — Assessment & Plan Note (Signed)
Likely etiology for chronic recurrent rhinitis and cough, since improved already recently with treatment flonase, anti-histamine. Also trigger with dog at home possibly - Repeat trial since stopped, Flonase regularly and Loratadine, may stop again in winter see if symptom recur

## 2017-11-03 NOTE — Assessment & Plan Note (Signed)
Mildly elevated initial BP - Outside office readings improved including Cardiology - ACEi cough (1 year ago off ACEi) Complication with Systolic CHF NICM Followed by [facility] Cardiology Dr Clayborn Bigness   Plan:  1. Continue current BP regimen - Carvedilol 12.5mg  BID,  Losartan 25mg  2. Encourage improved lifestyle - low sodium diet, regular exercise - weight loss 3. Continue monitor BP outside office, bring readings to next visit, if persistently >140/90 or new symptoms notify office sooner 4. Follow-up q 6 months Annual phys + labs

## 2017-11-03 NOTE — Patient Instructions (Addendum)
Thank you for coming to the clinic today.  1.  Referral placed today for screening colonoscopy (1st one) if you do not hear back within 2 weeks then call them  GASTROENTEROLOGY (GI)  [facility]) [address] [place], [place] [address] Phone: [phone]  [facility]) [address] [place], [place] [address] Main: [phone]  ------------------- Printed rx Sildenafil  Discount generic Sildenafil pills $1 per [facility]   Address: [address] [place], [place] [address] Hours: 8:30AM-6:30PM Phone: [phone]  ---------- For Prostate / urinating at night can try the Super Beta Prostate supplement OR Saw Palmetto capsules 80 to 160mg  TWICE daily to see if it reduces your nocturia  Ask insurance about cost of Shingrix new shingles vaccine - 2 doses within 6 months  DUE for FASTING BLOOD WORK (no food or drink after midnight before the lab appointment, only water or coffee without cream/sugar on the morning of)  SCHEDULE "Lab Only" visit in the morning at the clinic for lab draw in 6 MONTHS  - Make sure Lab Only appointment is at about 1 week before your next appointment, so that results will be available  For Lab Results, once available within 2-3 days of blood draw, you can can log in to MyChart online to view your results and a brief explanation. Also, we can discuss results at next follow-up visit.  Please schedule a Follow-up Appointment to: Return in about 6 months (around 05/03/2018) for Annual Physical.  If you have any other questions or concerns, please feel free to call the clinic or send a message through [facility]. You may also schedule an earlier appointment if necessary.  Additionally, you may be receiving a survey about your experience at our clinic within a few days to 1 week by e-mail or mail. We value your feedback.  Nobie Putnam, DO [facility]

## 2017-11-03 NOTE — Assessment & Plan Note (Signed)
Consistent with multifactorial ED, seems worse following cardiomyopathy, started anti-HTN BB therapy, and probably age-related ED - no prior trial on PDE5 inhibitors - Cleared by Cardiology to try PDE5  Plan: 1. Printed rx trial on generic Sildenafil 20mg  tabs, take 1-5 tabs about 30 min prior to sexual activity, may bring to [employer] for discount price, cautioned on side effects and safety if gets chest pain, pressure, sweating, dyspnea needs to stop and immediately seek medical attention

## 2017-11-06 DIAGNOSIS — M6281 Muscle weakness (generalized): Secondary | ICD-10-CM | POA: Diagnosis not present

## 2017-11-06 DIAGNOSIS — M5442 Lumbago with sciatica, left side: Secondary | ICD-10-CM | POA: Diagnosis not present

## 2017-11-06 DIAGNOSIS — M25652 Stiffness of left hip, not elsewhere classified: Secondary | ICD-10-CM | POA: Diagnosis not present

## 2017-11-19 DIAGNOSIS — M5442 Lumbago with sciatica, left side: Secondary | ICD-10-CM | POA: Diagnosis not present

## 2017-11-19 DIAGNOSIS — M6281 Muscle weakness (generalized): Secondary | ICD-10-CM | POA: Diagnosis not present

## 2017-11-19 DIAGNOSIS — M533 Sacrococcygeal disorders, not elsewhere classified: Secondary | ICD-10-CM | POA: Diagnosis not present

## 2017-11-26 DIAGNOSIS — M533 Sacrococcygeal disorders, not elsewhere classified: Secondary | ICD-10-CM | POA: Diagnosis not present

## 2017-11-26 DIAGNOSIS — M5136 Other intervertebral disc degeneration, lumbar region: Secondary | ICD-10-CM | POA: Diagnosis not present

## 2017-12-04 DIAGNOSIS — M533 Sacrococcygeal disorders, not elsewhere classified: Secondary | ICD-10-CM | POA: Diagnosis not present

## 2017-12-04 DIAGNOSIS — M6281 Muscle weakness (generalized): Secondary | ICD-10-CM | POA: Diagnosis not present

## 2017-12-04 DIAGNOSIS — M5442 Lumbago with sciatica, left side: Secondary | ICD-10-CM | POA: Diagnosis not present

## 2017-12-18 DIAGNOSIS — M533 Sacrococcygeal disorders, not elsewhere classified: Secondary | ICD-10-CM | POA: Diagnosis not present

## 2017-12-18 DIAGNOSIS — M5442 Lumbago with sciatica, left side: Secondary | ICD-10-CM | POA: Diagnosis not present

## 2017-12-18 DIAGNOSIS — M6281 Muscle weakness (generalized): Secondary | ICD-10-CM | POA: Diagnosis not present

## 2018-01-23 ENCOUNTER — Encounter: Payer: Self-pay | Admitting: *Deleted

## 2018-03-09 ENCOUNTER — Telehealth: Payer: Self-pay | Admitting: Gastroenterology

## 2018-03-09 NOTE — Telephone Encounter (Signed)
Patient called and now has insurance and would like to schedule his colonoscopy.

## 2018-03-10 NOTE — Telephone Encounter (Signed)
Returned patient's call to schedule procedure.   LVM for callback.

## 2018-03-13 ENCOUNTER — Other Ambulatory Visit: Payer: Self-pay

## 2018-03-13 DIAGNOSIS — Z1211 Encounter for screening for malignant neoplasm of colon: Secondary | ICD-10-CM

## 2018-03-16 ENCOUNTER — Telehealth: Payer: Self-pay

## 2018-03-16 ENCOUNTER — Other Ambulatory Visit: Payer: Self-pay

## 2018-03-16 MED ORDER — NA SULFATE-K SULFATE-MG SULF 17.5-3.13-1.6 GM/177ML PO SOLN: 1.0000 | Freq: Once | ORAL | 0 refills | Status: AC

## 2018-03-16 NOTE — Telephone Encounter (Signed)
Patient left a message stating he was confused about which instructions to follow for the prep. Paper vs Rx.   Called patient back and advised him to follow the instructions from the pharmacy that came with his prep.

## 2018-03-17 ENCOUNTER — Encounter
Admission: RE | Disposition: A | Discharge status: Home / Self Care | Payer: Self-pay | Source: Ambulatory Visit | Attending: Gastroenterology

## 2018-03-17 ENCOUNTER — Ambulatory Visit
Admission: RE | Admit: 2018-03-17 | Discharge: 2018-03-17 | Disposition: A | Discharge status: Home / Self Care | Payer: BLUE CROSS/BLUE SHIELD | Source: Ambulatory Visit | Attending: Gastroenterology | Admitting: Gastroenterology

## 2018-03-17 ENCOUNTER — Encounter: Payer: Self-pay | Admitting: Anesthesiology

## 2018-03-17 ENCOUNTER — Ambulatory Visit: Payer: BLUE CROSS/BLUE SHIELD | Admitting: Certified Registered Nurse Anesthetist

## 2018-03-17 DIAGNOSIS — M1990 Unspecified osteoarthritis, unspecified site: Secondary | ICD-10-CM | POA: Diagnosis not present

## 2018-03-17 DIAGNOSIS — Z7982 Long term (current) use of aspirin: Secondary | ICD-10-CM | POA: Insufficient documentation

## 2018-03-17 DIAGNOSIS — I10 Essential (primary) hypertension: Secondary | ICD-10-CM | POA: Insufficient documentation

## 2018-03-17 DIAGNOSIS — Z87891 Personal history of nicotine dependence: Secondary | ICD-10-CM | POA: Insufficient documentation

## 2018-03-17 DIAGNOSIS — Z79899 Other long term (current) drug therapy: Secondary | ICD-10-CM | POA: Diagnosis not present

## 2018-03-17 DIAGNOSIS — D122 Benign neoplasm of ascending colon: Secondary | ICD-10-CM

## 2018-03-17 DIAGNOSIS — Z1211 Encounter for screening for malignant neoplasm of colon: Secondary | ICD-10-CM | POA: Diagnosis not present

## 2018-03-17 DIAGNOSIS — Z6836 Body mass index (BMI) 36.0-36.9, adult: Secondary | ICD-10-CM | POA: Diagnosis not present

## 2018-03-17 DIAGNOSIS — Z8249 Family history of ischemic heart disease and other diseases of the circulatory system: Secondary | ICD-10-CM | POA: Diagnosis not present

## 2018-03-17 DIAGNOSIS — K5730 Diverticulosis of large intestine without perforation or abscess without bleeding: Secondary | ICD-10-CM | POA: Diagnosis not present

## 2018-03-17 DIAGNOSIS — K635 Polyp of colon: Secondary | ICD-10-CM | POA: Diagnosis not present

## 2018-03-17 DIAGNOSIS — K5790 Diverticulosis of intestine, part unspecified, without perforation or abscess without bleeding: Secondary | ICD-10-CM | POA: Diagnosis not present

## 2018-03-17 DIAGNOSIS — Z888 Allergy status to other drugs, medicaments and biological substances status: Secondary | ICD-10-CM | POA: Insufficient documentation

## 2018-03-17 HISTORY — PX: COLONOSCOPY WITH PROPOFOL: SHX5780

## 2018-03-17 SURGERY — COLONOSCOPY WITH PROPOFOL
Anesthesia: General

## 2018-03-17 MED ORDER — DEXAMETHASONE SODIUM PHOSPHATE 10 MG/ML IJ SOLN: INTRAMUSCULAR | Status: DC | PRN

## 2018-03-17 MED ORDER — PHENYLEPHRINE HCL 10 MG/ML IJ SOLN
INTRAMUSCULAR | Status: DC | PRN
  Administered 2018-03-17 (×2): 200 ug via INTRAVENOUS

## 2018-03-17 MED ORDER — PROPOFOL 500 MG/50ML IV EMUL
INTRAVENOUS | Status: AC
Start: 2018-03-17 — End: ?
  Filled 2018-03-17: qty 50

## 2018-03-17 MED ORDER — PROPOFOL 500 MG/50ML IV EMUL
INTRAVENOUS | Status: DC | PRN
  Administered 2018-03-17: 160 ug/kg/min via INTRAVENOUS

## 2018-03-17 MED ORDER — PROPOFOL 10 MG/ML IV BOLUS
INTRAVENOUS | Status: AC
  Filled 2018-03-17: qty 20

## 2018-03-17 MED ORDER — SODIUM CHLORIDE 0.9 % IV SOLN
INTRAVENOUS | Status: DC
  Administered 2018-03-17: 1000 mL via INTRAVENOUS
  Administered 2018-03-17: 10:00:00 via INTRAVENOUS

## 2018-03-17 MED ORDER — LIDOCAINE HCL (PF) 1 % IJ SOLN
2.0000 mL | Freq: Once | INTRAMUSCULAR | Status: AC
  Administered 2018-03-17: 0.3 mL via INTRADERMAL

## 2018-03-17 MED ORDER — PROPOFOL 10 MG/ML IV BOLUS
INTRAVENOUS | Status: DC | PRN
  Administered 2018-03-17: 70 mg via INTRAVENOUS
  Administered 2018-03-17: 30 mg via INTRAVENOUS
  Administered 2018-03-17: 10 mg via INTRAVENOUS
  Administered 2018-03-17: 20 mg via INTRAVENOUS
  Administered 2018-03-17: 30 mg via INTRAVENOUS
  Administered 2018-03-17: 20 mg via INTRAVENOUS

## 2018-03-17 MED ORDER — LIDOCAINE HCL (PF) 1 % IJ SOLN
INTRAMUSCULAR | Status: AC
  Administered 2018-03-17: 0.3 mL via INTRADERMAL
  Filled 2018-03-17: qty 2

## 2018-03-17 NOTE — Op Note (Signed)
[facility] Gastroenterology Patient Name: Vincent Clements Procedure Date: 03/17/2018 10:05 AM MRN: [id number] Account #: [id number] Date of Birth: 1961/08/06 Admit Type: Outpatient Age: 57 Room: [facility] ENDO ROOM 1 Gender: Male Note Status: Finalized Procedure:            Colonoscopy Indications:          Screening for colorectal malignant neoplasm Providers:            [name] MD, MD Referring MD:         Olin Hauser (Referring MD) Medicines:            Monitored Anesthesia Care Complications:        No immediate complications. Procedure:            Pre-Anesthesia Assessment:                       - Prior to the procedure, a History and Physical was                        performed, and patient medications, allergies and                        sensitivities were reviewed. The patient's tolerance of                        previous anesthesia was reviewed.                       - The risks and benefits of the procedure and the                        sedation options and risks were discussed with the                        patient. All questions were answered and informed                        consent was obtained.                       - ASA Grade Assessment: III - A patient with severe                        systemic disease.                       After obtaining informed consent, the colonoscope was                        passed under direct vision. Throughout the procedure,                        the patient's blood pressure, pulse, and oxygen                        saturations were monitored continuously. The                        Colonoscope was introduced through the anus and  advanced to the the cecum, identified by the                        appendiceal orifice, IC valve and transillumination.                        The colonoscopy was performed with ease. The patient                        tolerated the procedure well. The  quality of the bowel                        preparation was good. Findings:      Two sessile polyps were found in the ascending colon. The polyps were 10       to 11 mm in size. These polyps were removed with a hot snare. Resection       and retrieval were complete.      A 7 mm polyp was found in the ascending colon. The polyp was sessile.       The polyp was removed with a cold snare. Resection and retrieval were       complete.      Multiple small-mouthed diverticula were found in the sigmoid colon.      The exam was otherwise without abnormality on direct and retroflexion       views. Impression:           - Two 10 to 11 mm polyps in the ascending colon,                        removed with a hot snare. Resected and retrieved.                       - One 7 mm polyp in the ascending colon, removed with a                        cold snare. Resected and retrieved.                       - Diverticulosis in the sigmoid colon.                       - The examination was otherwise normal on direct and                        retroflexion views. Recommendation:       - Discharge patient to home (with escort).                       - Resume previous diet.                       - Continue present medications.                       - Await pathology results.                       - Repeat colonoscopy in 3 years for surveillance. Procedure Code(s):    --- Professional ---  45385, Colonoscopy, flexible; with removal of tumor(s),                        polyp(s), or other lesion(s) by snare technique Diagnosis Code(s):    --- Professional ---                       D12.2, Benign neoplasm of ascending colon                       Z12.11, Encounter for screening for malignant neoplasm                        of colon                       K57.30, Diverticulosis of large intestine without                        perforation or abscess without bleeding CPT copyright 2016 American  Medical Association. All rights reserved. The codes documented in this report are preliminary and upon coder review may  be revised to meet current compliance requirements. [name], MD [name] MD, MD 03/17/2018 10:27:11 AM This report has been signed electronically. Number of Addenda: 0 Note Initiated On: 03/17/2018 10:05 AM Scope Withdrawal Time: 0 hours 9 minutes 38 seconds  Total Procedure Duration: 0 hours 12 minutes 1 second       [facility]

## 2018-03-17 NOTE — Anesthesia Post-op Follow-up Note (Signed)
Anesthesia QCDR form completed.        

## 2018-03-17 NOTE — H&P (Signed)
Vincent Bellows, MD [address] [place], [place], [place], [address] [address], [place], [place], [place], [address] Phone: [phone]  Fax: [phone]  Primary Care Physician:  Olin Hauser, DO   Pre-Procedure History & Physical: HPI:  Vincent Clements is a 57 y.o. male is here for an colonoscopy.   Past Medical History:  Diagnosis Date  . Arthritis   . Hypertension     Past Surgical History:  Procedure Laterality Date  . CHOLECYSTECTOMY  10/06/2006  . KNEE ARTHROSCOPY Right 02/27/2017   Procedure: right knee arthroscopy, partial medial and lateral menisectomy;  Surgeon: Hessie Knows, MD;  Location: [facility] ORS;  Service: Orthopedics;  Laterality: Right;    Prior to Admission medications   Medication Sig Start Date End Date Taking? Authorizing Provider  acetaminophen (TYLENOL) 500 MG tablet Take 500 mg by mouth every 6 (six) hours as needed for moderate pain.    [provider]  aspirin EC 81 MG tablet Take 81 mg by mouth at bedtime.     [provider]  benzonatate (TESSALON) 100 MG capsule Take 1 capsule (100 mg total) 3 (three) times daily as needed by mouth for cough. 11/03/17   Karamalegos, Devonne Doughty, DO  carvedilol (COREG) 12.5 MG tablet Take 12.5 mg 2 (two) times daily with a meal by mouth.    Callwood, Dwayne D, MD  fluticasone (FLONASE) 50 MCG/ACT nasal spray Place 2 sprays into both nostrils daily. Use for 4-6 weeks then stop and use seasonally or as needed. 08/11/17   Karamalegos, Devonne Doughty, DO  loratadine (CLARITIN) 10 MG tablet Take by mouth.    [provider]  losartan (COZAAR) 25 MG tablet Take by mouth. 05/12/17 05/12/18  [provider]  sildenafil (REVATIO) 20 MG tablet Take 1-5 pills about 30 min prior to sex. Start with 1 and increase as needed. 11/03/17   Olin Hauser, DO    Allergies as of 03/13/2018 - Review Complete 11/03/2017  Allergen Reaction Noted  . Lisinopril Cough 01/21/2017     Family History  Problem Relation Age of Onset  . Heart disease Mother   . Heart failure Mother   . Stroke Maternal Grandfather     Social History   Socioeconomic History  . Marital status: Married    Spouse name: Not on file  . Number of children: Not on file  . Years of education: [employer]  . Highest education level: Not on file  Social Needs  . Financial resource strain: Not on file  . Food insecurity - worry: Not on file  . Food insecurity - inability: Not on file  . Transportation needs - medical: Not on file  . Transportation needs - non-medical: Not on file  Occupational History  . Occupation: Animal nutritionist  Tobacco Use  . Smoking status: Never Smoker  . Smokeless tobacco: Former Systems developer    Types: Chew, Snuff  . Tobacco comment: pt quit chewing tobacco 01/11/2006  Substance and Sexual Activity  . Alcohol use: Yes    Comment: occasionally, 1 per week  . Drug use: No  . Sexual activity: Not on file  Other Topics Concern  . Not on file  Social History Narrative  . Not on file    Review of Systems: See HPI, otherwise negative ROS  Physical Exam: BP 126/76   Pulse 75   Temp (!) 96.8 F (36 C) (Tympanic)   Resp 17   Ht 5\' 9"  (1.753 m)  Wt 250 lb (113.4 kg)   SpO2 98%   BMI 36.92 kg/m  General:   Alert,  pleasant and cooperative in NAD Head:  Normocephalic and atraumatic. Neck:  Supple; no masses or thyromegaly. Lungs:  Clear throughout to auscultation, normal respiratory effort.    Heart:  +S1, +S2, Regular rate and rhythm, No edema. Abdomen:  Soft, nontender and nondistended. Normal bowel sounds, without guarding, and without rebound.   Neurologic:  Alert and  oriented x4;  grossly normal neurologically.  Impression/Plan: Vincent Clements is here for an colonoscopy to be performed for Screening colonoscopy average risk   Risks, benefits, limitations, and alternatives regarding  colonoscopy have been reviewed with the patient.  Questions have  been answered.  All parties agreeable.   Vincent Bellows, MD  03/17/2018, 9:46 AM

## 2018-03-17 NOTE — Transfer of Care (Signed)
Immediate Anesthesia Transfer of Care Note  Patient: Vincent Clements  Procedure(s) Performed: COLONOSCOPY WITH PROPOFOL (N/A )  Patient Location: PACU  Anesthesia Type:General  Level of Consciousness: awake, alert  and oriented  Airway & Oxygen Therapy: Patient Spontanous Breathing and Patient connected to nasal cannula oxygen  Post-op Assessment: Report given to RN and Post -op Vital signs reviewed and stable  Post vital signs: Reviewed and stable  Last Vitals:  Vitals:   03/17/18 0939  BP: 126/76  Pulse: 75  Resp: 17  Temp: (!) 36 C  SpO2: 98%    Last Pain:  Vitals:   03/17/18 0939  TempSrc: Tympanic         Complications: No apparent anesthesia complications

## 2018-03-17 NOTE — Anesthesia Procedure Notes (Signed)
Performed by: Hawley Michel, CRNA Pre-anesthesia Checklist: Patient identified, Emergency Drugs available, Patient being monitored, Suction available and Timeout performed Patient Re-evaluated:Patient Re-evaluated prior to induction Oxygen Delivery Method: Nasal cannula Induction Type: IV induction       

## 2018-03-17 NOTE — Anesthesia Postprocedure Evaluation (Signed)
Anesthesia Post Note  Patient: Vincent Clements  Procedure(s) Performed: COLONOSCOPY WITH PROPOFOL (N/A )  Patient location during evaluation: Endoscopy Anesthesia Type: General Level of consciousness: awake and alert Pain management: pain level controlled Vital Signs Assessment: post-procedure vital signs reviewed and stable Respiratory status: spontaneous breathing, nonlabored ventilation, respiratory function stable and patient connected to nasal cannula oxygen Cardiovascular status: blood pressure returned to baseline and stable Postop Assessment: no apparent nausea or vomiting Anesthetic complications: no     Last Vitals:  Vitals:   03/17/18 1051 03/17/18 1101  BP: 114/74 (!) 110/53  Pulse: 77 71  Resp: 16 10  Temp:    SpO2: 96% 100%    Last Pain:  Vitals:   03/17/18 1031  TempSrc: Tympanic                 Miliano Cotten S

## 2018-03-17 NOTE — Anesthesia Preprocedure Evaluation (Signed)
Anesthesia Evaluation  Patient identified by MRN, date of birth, ID band Patient awake    Reviewed: Allergy & Precautions, NPO status , Patient's Chart, lab work & pertinent test results, reviewed documented beta blocker date and time   Airway Mallampati: III  TM Distance: >3 FB     Dental  (+) Chipped   Pulmonary           Cardiovascular hypertension, Pt. on medications and Pt. on home beta blockers      Neuro/Psych    GI/Hepatic   Endo/Other  Morbid obesity  Renal/GU      Musculoskeletal  (+) Arthritis ,   Abdominal   Peds  Hematology   Anesthesia Other Findings   Reproductive/Obstetrics                             Anesthesia Physical Anesthesia Plan  ASA: III  Anesthesia Plan: General   Post-op Pain Management:    Induction: Intravenous  PONV Risk Score and Plan:   Airway Management Planned:   Additional Equipment:   Intra-op Plan:   Post-operative Plan:   Informed Consent: I have reviewed the patients History and Physical, chart, labs and discussed the procedure including the risks, benefits and alternatives for the proposed anesthesia with the patient or authorized representative who has indicated his/her understanding and acceptance.     Plan Discussed with: CRNA  Anesthesia Plan Comments:         Anesthesia Quick Evaluation

## 2018-03-18 ENCOUNTER — Encounter: Payer: Self-pay | Admitting: Gastroenterology

## 2018-03-19 LAB — SURGICAL PATHOLOGY

## 2018-03-22 ENCOUNTER — Encounter: Payer: Self-pay | Admitting: Gastroenterology

## 2018-04-29 DIAGNOSIS — I10 Essential (primary) hypertension: Secondary | ICD-10-CM | POA: Diagnosis not present

## 2018-04-29 DIAGNOSIS — E782 Mixed hyperlipidemia: Secondary | ICD-10-CM | POA: Diagnosis not present

## 2018-04-29 DIAGNOSIS — I2510 Atherosclerotic heart disease of native coronary artery without angina pectoris: Secondary | ICD-10-CM | POA: Diagnosis not present

## 2018-04-29 DIAGNOSIS — I480 Paroxysmal atrial fibrillation: Secondary | ICD-10-CM | POA: Diagnosis not present

## 2018-05-12 ENCOUNTER — Other Ambulatory Visit: Payer: BLUE CROSS/BLUE SHIELD

## 2018-05-12 DIAGNOSIS — R7309 Other abnormal glucose: Secondary | ICD-10-CM

## 2018-05-12 DIAGNOSIS — Z125 Encounter for screening for malignant neoplasm of prostate: Secondary | ICD-10-CM | POA: Diagnosis not present

## 2018-05-12 DIAGNOSIS — Z114 Encounter for screening for human immunodeficiency virus [HIV]: Secondary | ICD-10-CM

## 2018-05-12 DIAGNOSIS — Z1159 Encounter for screening for other viral diseases: Secondary | ICD-10-CM

## 2018-05-12 DIAGNOSIS — R635 Abnormal weight gain: Secondary | ICD-10-CM | POA: Diagnosis not present

## 2018-05-12 DIAGNOSIS — R799 Abnormal finding of blood chemistry, unspecified: Secondary | ICD-10-CM | POA: Diagnosis not present

## 2018-05-12 DIAGNOSIS — Z6841 Body Mass Index (BMI) 40.0 and over, adult: Secondary | ICD-10-CM

## 2018-05-12 DIAGNOSIS — I10 Essential (primary) hypertension: Secondary | ICD-10-CM | POA: Diagnosis not present

## 2018-05-12 DIAGNOSIS — Z0000 Encounter for general adult medical examination without abnormal findings: Secondary | ICD-10-CM

## 2018-05-12 IMAGING — MR MR LUMBAR SPINE W/O CM
5 series · 37 of 48 positions shown · non-contrast
Comparison: None.

CLINICAL DATA: Intermittent low back pain since an injury in 7443.
Most recent episode of pain began in March 2017 with left leg pain.
Symptoms are worse with walking.

EXAM:
MRI LUMBAR SPINE WITHOUT CONTRAST
TECHNIQUE: Multiplanar, multisequence MR imaging of the lumbar spine was
performed. No intravenous contrast was administered.

[Series 2: T2 · sagittal · 4.0mm · 0.81mm/px · 6 of 15 slices shown (1 of 2)]
[im 1/15]
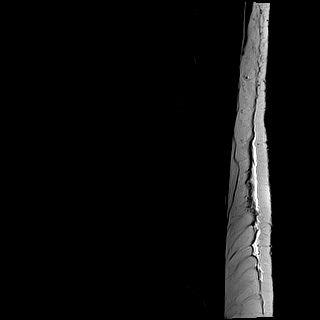
[im 3/15]
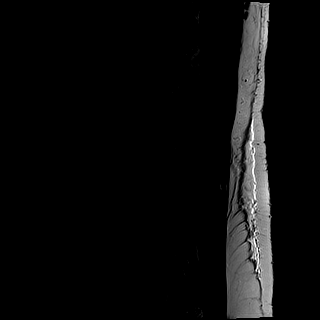
[im 6/15]
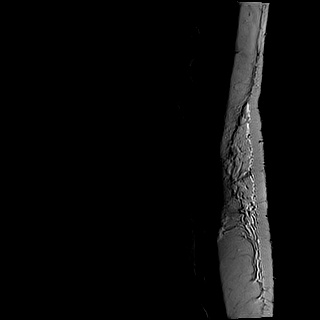
[im 9/15]
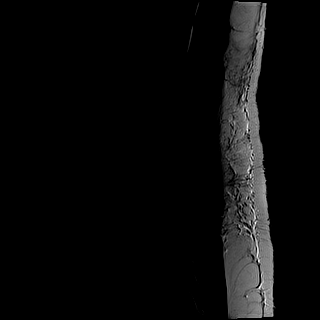
[im 12/15]
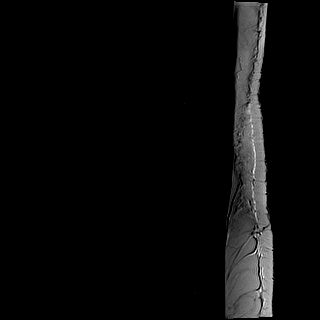
[im 15/15]
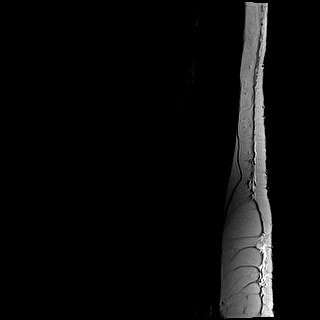

[Series 3: T1 · sagittal · 4.0mm · 0.81mm/px · 6 of 15 slices shown (1 of 2)]
[im 1/15]
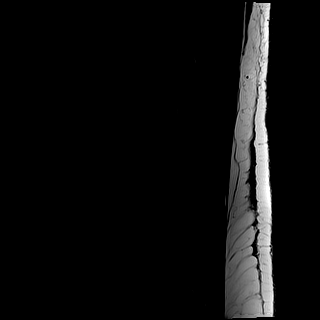
[im 3/15]
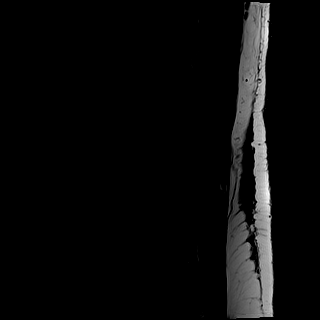
[im 6/15]
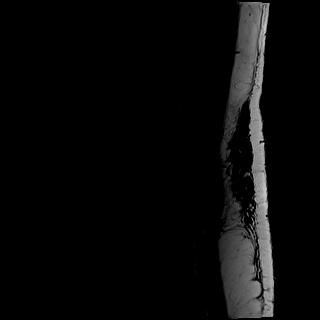
[im 9/15]
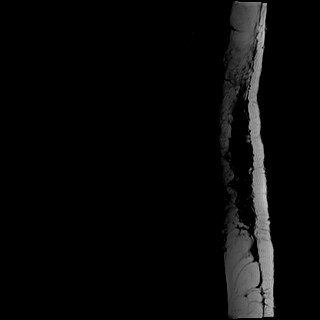
[im 12/15]
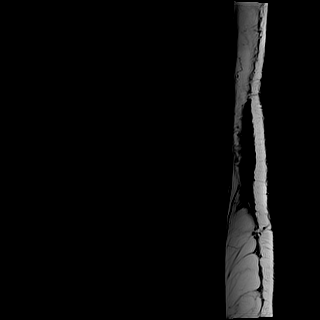
[im 15/15]
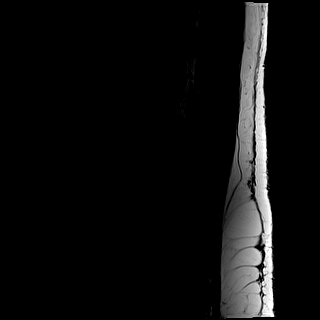

[Series 4: STIR · sagittal · 4.0mm · 1.02mm/px · 6 of 15 slices shown]
[im 1/15]
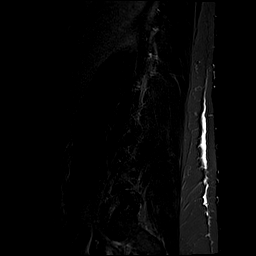
[im 3/15]
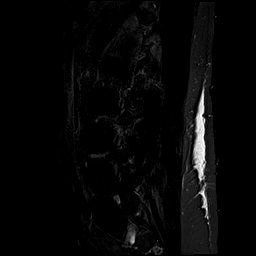
[im 6/15]
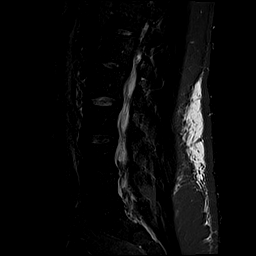
[im 9/15]
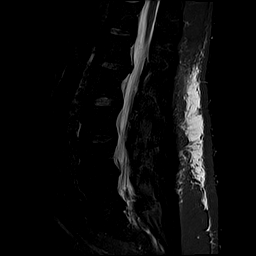
[im 12/15]
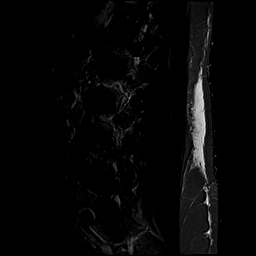
[im 15/15]
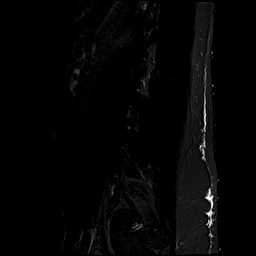

[Series 5: T2 · axial · 4.0mm · 0.78mm/px · z∈[-72,+152]mm · 10 of 41 slices shown (2 of 2)]
[im 1/41]
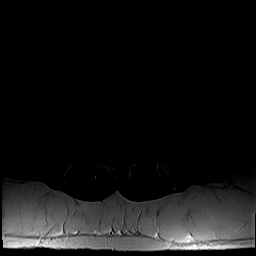
[im 3/41]
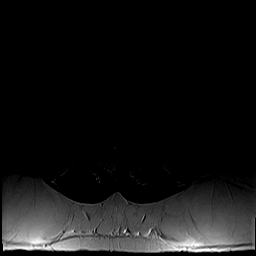
[im 6/41]
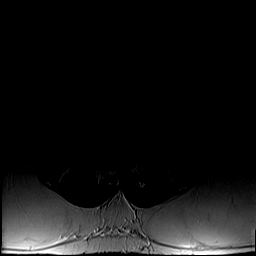
[im 12/41]
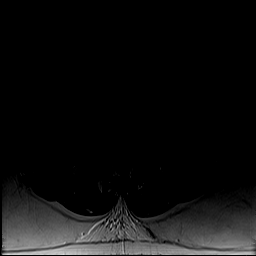
[im 18/41]
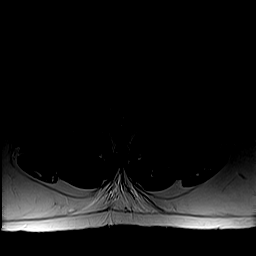
[im 21/41]
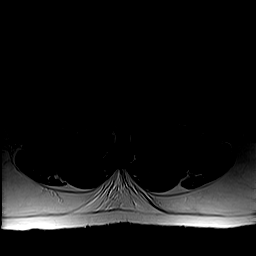
[im 23/41]
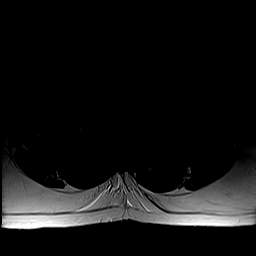
[im 29/41]
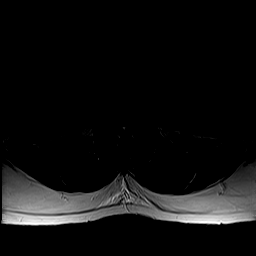
[im 35/41]
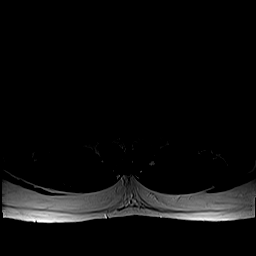
[im 41/41]
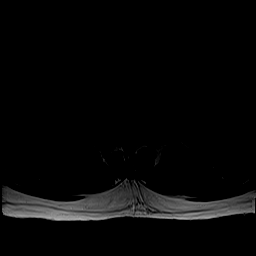

[Series 6: T1 · axial · 4.0mm · 0.39mm/px · z∈[-72,+152]mm · 9 of 41 slices shown (2 of 2)]
[im 1/41]
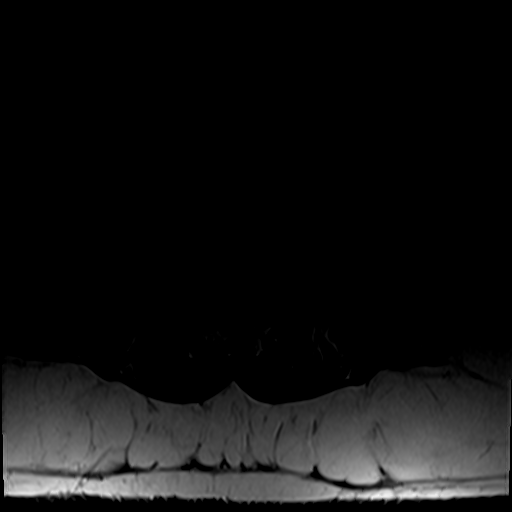
[im 6/41]
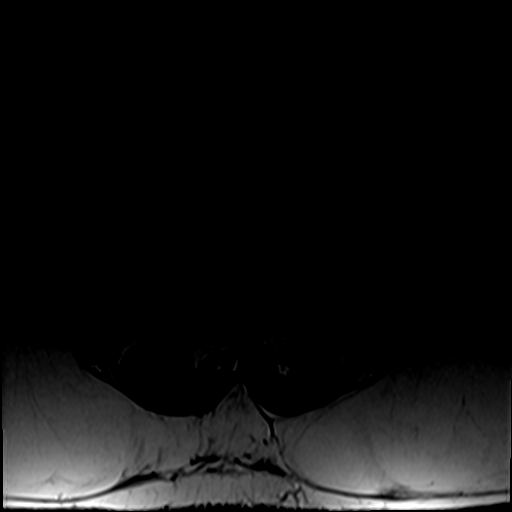
[im 12/41]
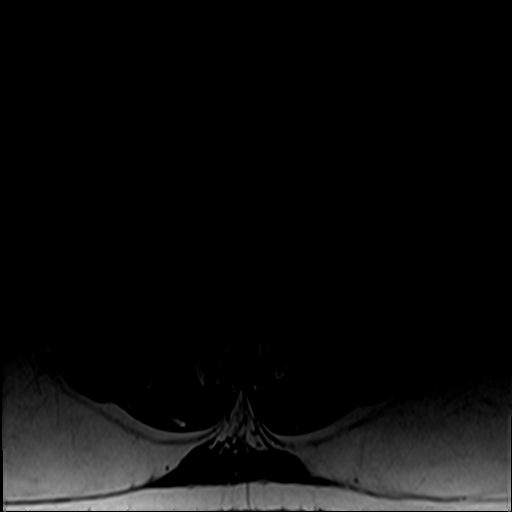
[im 18/41]
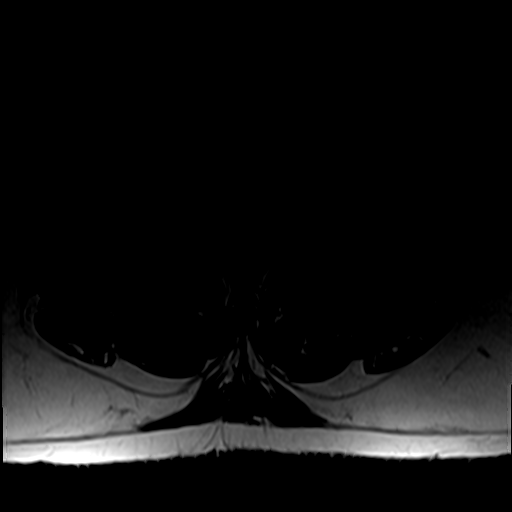
[im 21/41]
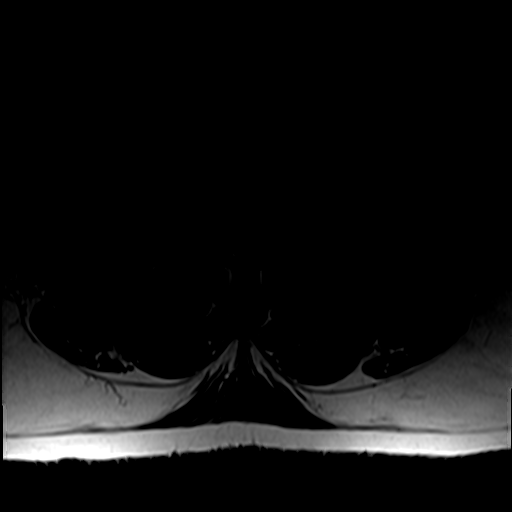
[im 23/41]
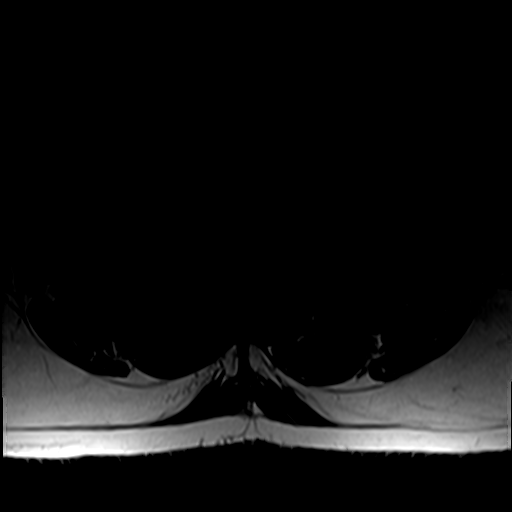
[im 29/41]
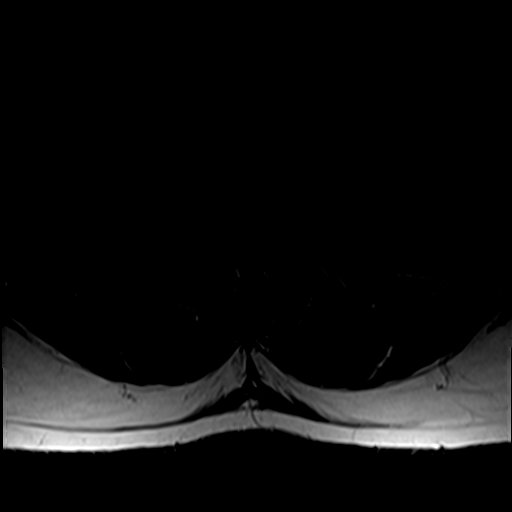
[im 35/41]
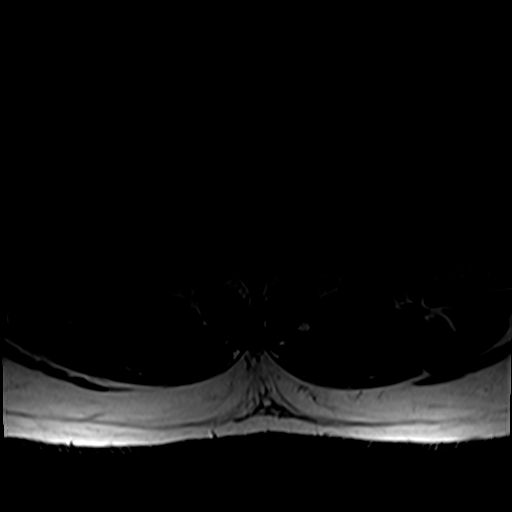
[im 41/41]
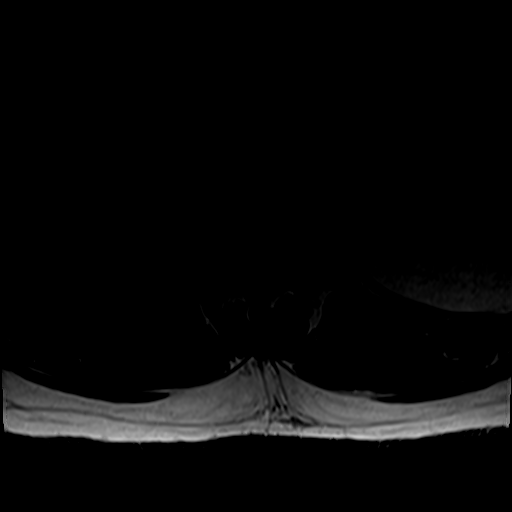

[37 of 48 positions shown; findings below may reference images not displayed]

FINDINGS: Segmentation:  Standard.

Alignment:  Normal.

Vertebrae:  No fracture or worrisome lesion.

Conus medullaris: Extends to the T12-L1 level and appears normal.

Paraspinal and other soft tissues: Negative.

Disc levels:

T12-L1: Tiny right paracentral protrusion without central canal or
foraminal narrowing.

L1-2: Shallow left paracentral protrusion without central canal or
foraminal stenosis.

L2-3: Shallow central protrusion without central canal or foraminal
stenosis.

L3-4: Minimal disc bulge. The central canal and foramina are patent.

L4-5: Small left subarticular recess protrusion could irritate the
descending left L5 root. There is mild to moderate facet
degenerative change. Mild to moderate foraminal narrowing is also
seen, worse on the left.

L5-S1: Mild facet degenerative change. Shallow broad-based left
paracentral protrusion without central canal or foraminal stenosis.
IMPRESSION: Overall mild central canal stenosis most notable at L5-S1 where a
left subarticular recess protrusion could potentially irritate the
descending left L5 root although no nerve root compression is
identified. There is also mild to moderate foraminal narrowing at
this level, more notable on the left.

## 2018-05-13 LAB — CBC WITH DIFFERENTIAL/PLATELET
BASOS PCT: 0.9 %
Basophils Absolute: 62 cells/uL (ref 0–200)
EOS ABS: 207 {cells}/uL (ref 15–500)
Eosinophils Relative: 3 %
HCT: 46.4 % (ref 38.5–50.0)
Hemoglobin: 15.9 g/dL (ref 13.2–17.1)
Lymphs Abs: 828 cells/uL — ABNORMAL LOW (ref 850–3900)
MCH: 29.3 pg (ref 27.0–33.0)
MCHC: 34.3 g/dL (ref 32.0–36.0)
MCV: 85.5 fL (ref 80.0–100.0)
MONOS PCT: 9.9 %
MPV: 11.9 fL (ref 7.5–12.5)
NEUTROS PCT: 74.2 %
Neutro Abs: 5120 cells/uL (ref 1500–7800)
PLATELETS: 282 10*3/uL (ref 140–400)
RBC: 5.43 10*6/uL (ref 4.20–5.80)
RDW: 14.6 % (ref 11.0–15.0)
TOTAL LYMPHOCYTE: 12 %
WBC mixed population: 683 cells/uL (ref 200–950)
WBC: 6.9 10*3/uL (ref 3.8–10.8)

## 2018-05-13 LAB — COMPLETE METABOLIC PANEL WITH GFR
AG Ratio: 2.1 (calc) (ref 1.0–2.5)
ALBUMIN MSPROF: 4.1 g/dL (ref 3.6–5.1)
ALT: 24 U/L (ref 9–46)
AST: 21 U/L (ref 10–35)
Alkaline phosphatase (APISO): 79 U/L (ref 40–115)
BUN: 9 mg/dL (ref 7–25)
CALCIUM: 9.4 mg/dL (ref 8.6–10.3)
CO2: 29 mmol/L (ref 20–32)
CREATININE: 0.71 mg/dL (ref 0.70–1.33)
Chloride: 102 mmol/L (ref 98–110)
GFR, EST NON AFRICAN AMERICAN: 106 mL/min/{1.73_m2} (ref >=60)
GFR, Est African American: 122 mL/min/{1.73_m2} (ref >=60)
GLOBULIN: 2 g/dL (ref 1.9–3.7)
GLUCOSE: 93 mg/dL (ref 65–99)
Potassium: 4.2 mmol/L (ref 3.5–5.3)
SODIUM: 140 mmol/L (ref 135–146)
TOTAL PROTEIN: 6.1 g/dL (ref 6.1–8.1)
Total Bilirubin: 0.6 mg/dL (ref 0.2–1.2)

## 2018-05-13 LAB — PSA, TOTAL WITH REFLEX TO PSA, FREE: PSA, TOTAL: 0.9 ng/mL (ref <=4.0)

## 2018-05-13 LAB — LIPID PANEL
CHOLESTEROL: 177 mg/dL (ref <200)
HDL: 41 mg/dL (ref >40)
LDL CHOLESTEROL (CALC): 110 mg/dL — AB
Non-HDL Cholesterol (Calc): 136 mg/dL (calc) — ABNORMAL HIGH (ref <130)
Total CHOL/HDL Ratio: 4.3 (calc) (ref <5.0)
Triglycerides: 146 mg/dL (ref <150)

## 2018-05-13 LAB — TSH: TSH: 0.98 mIU/L (ref 0.40–4.50)

## 2018-05-13 LAB — HEPATITIS C ANTIBODY
HEP C AB: NONREACTIVE
SIGNAL TO CUT-OFF: 0.01 (ref <1.00)

## 2018-05-13 LAB — HEMOGLOBIN A1C
Hgb A1c MFr Bld: 5.6 % of total Hgb (ref <5.7)
Mean Plasma Glucose: 114 (calc)
eAG (mmol/L): 6.3 (calc)

## 2018-05-13 LAB — HIV ANTIBODY (ROUTINE TESTING W REFLEX): HIV 1&2 Ab, 4th Generation: NONREACTIVE

## 2018-05-14 ENCOUNTER — Encounter: Payer: Self-pay | Admitting: Family Medicine

## 2018-05-14 DIAGNOSIS — R7309 Other abnormal glucose: Secondary | ICD-10-CM | POA: Insufficient documentation

## 2018-05-19 ENCOUNTER — Encounter: Payer: Self-pay | Admitting: Family Medicine

## 2018-05-19 ENCOUNTER — Ambulatory Visit (INDEPENDENT_AMBULATORY_CARE_PROVIDER_SITE_OTHER): Payer: BLUE CROSS/BLUE SHIELD | Admitting: Family Medicine

## 2018-05-19 VITALS — BP 122/69 | HR 62 | Temp 98.5°F | Resp 16 | Ht 69.0 in | Wt 219.6 lb

## 2018-05-19 DIAGNOSIS — M25561 Pain in right knee: Secondary | ICD-10-CM | POA: Diagnosis not present

## 2018-05-19 DIAGNOSIS — I480 Paroxysmal atrial fibrillation: Secondary | ICD-10-CM

## 2018-05-19 DIAGNOSIS — E7800 Pure hypercholesterolemia, unspecified: Secondary | ICD-10-CM

## 2018-05-19 DIAGNOSIS — E66811 Obesity, class 1: Secondary | ICD-10-CM

## 2018-05-19 DIAGNOSIS — R7309 Other abnormal glucose: Secondary | ICD-10-CM

## 2018-05-19 DIAGNOSIS — I10 Essential (primary) hypertension: Secondary | ICD-10-CM

## 2018-05-19 DIAGNOSIS — D126 Benign neoplasm of colon, unspecified: Secondary | ICD-10-CM | POA: Diagnosis not present

## 2018-05-19 DIAGNOSIS — E669 Obesity, unspecified: Secondary | ICD-10-CM | POA: Insufficient documentation

## 2018-05-19 DIAGNOSIS — Z0000 Encounter for general adult medical examination without abnormal findings: Secondary | ICD-10-CM | POA: Diagnosis not present

## 2018-05-19 DIAGNOSIS — G8929 Other chronic pain: Secondary | ICD-10-CM | POA: Diagnosis not present

## 2018-05-19 DIAGNOSIS — J3089 Other allergic rhinitis: Secondary | ICD-10-CM | POA: Diagnosis not present

## 2018-05-19 DIAGNOSIS — Z6824 Body mass index (BMI) 24.0-24.9, adult: Secondary | ICD-10-CM | POA: Insufficient documentation

## 2018-05-19 NOTE — Assessment & Plan Note (Signed)
Likely cause of post nasal drainage and scratchy throat from recent dust exposure

## 2018-05-19 NOTE — Progress Notes (Signed)
Subjective:    Patient ID: Vincent Clements, male    DOB: [birth year], 57 y.o.   MRN: [id number]  Vincent Clements is a 57 y.o. male presenting on 05/19/2018 for Annual Exam   HPI   Here for Annual Physical and Lab Review.  He is retired now, has failed his physical requirement test at work. 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 He has some pension money for retirement.  Mild Elevated A1c without dx Pre-Diabetes Reports concerns with previous history of elevated fasting sugar biometric 120s avg, now he has A1c 5.6 at recent labs, he thinks improved with weight loss and lifestyle change CBGs: Not checking Meds: never on med Reports good compliance. Tolerating well w/o side-effects Currently on ARB Lifestyle: - Weight loss 80 lbs in 6 months - Diet (improved balanced diet, low carb options, wheat, oat/bran, inc vegetables, drinks almost all water, very rarely diet soda)  - Exercise (improving general exercise walking, rarely has L knee issue feel like may give way but overall much improved) - Admits previously some symptoms of feeling lightheaded at times, since resolved Denies hypoglycemia  HYPERLIPIDEMIA: - Reports no concerns. Last lipid panel 04/2018, mostly well controlled mild elevated LDL 110, otherwise normal HDL and TG - Not on statin, never taken Lifestyle - Diet: Nearly quit all red meat and checks food labels for cholesterol  CHRONIC HTN: Reports concern with repeat Losartan recalls. Followed by Dr Clayborn Bigness [facility], dramatic improve BP now lower range after dramatic weight loss 80 lbs, prior range SBP 140 to 160 -now avg 120s Current Meds - Carvedilol 12.38m BID, and Losartan 225mdaily   Reports good compliance, took meds today. Tolerating well, w/o complaints.  Atrial Fibrillation, Paroxysmal [facility] 04/2018, he was advised to keep current meds including aspirin, despite  losartan recall. No new concerns. - Asking about ASA - advised to stay on by cardiology - asking about monitor for afib connect to phone He does not feel episodes of afib  Additional acute complaint - Woke up with sore scratchy throat - day after he did some cleaning and admits dust may have flared it up, not bothering him otherwise.   Health Maintenance:  Prostate CA Screening: No prior DRE. Last PSA 0.9 (05/12/18). Currently asymptomatic except for mild nocturia seems improved after weight loss, but he drinks frequent water. Denies BPH LUTS. No known family history of prostate CA.   Routine Hep C and HIV screening were negative.  Colon CA Screening: Last Colonoscopy (done by Dr KiJonathon BellowsGI), results with x 3 polyp tubular adenoma negative for malignancy or dysplasia by report, good for 3 years. Currently asymptomatic. No known family history of colon CA. Next due 02/2021  Had measles in 1972 - he had prior vaccine as well. He is asking about his immunity, has not had MMR titers drawn, he declines.  Also asking about Shingrix, he is due for this he had prior severe chicken pox, never had shingles.   [facility] 05/19/2018 11/03/2017 01/21/2017  Decreased Interest 0 0 0  Down, Depressed, Hopeless 0 0 0  PHQ - 2 Score 0 0 0    Past Medical History:  Diagnosis Date  . Arthritis   . Hypertension    Past Surgical History:  Procedure Laterality Date  . CHOLECYSTECTOMY  10/06/2006  . COLONOSCOPY WITH PROPOFOL N/A 03/17/2018   Procedure: COLONOSCOPY  WITH PROPOFOL;  Surgeon: [name], MD;  Location: [facility] ENDOSCOPY;  Service: Gastroenterology;  Laterality: N/A;  . KNEE ARTHROSCOPY Right 02/27/2017   Procedure: right knee arthroscopy, partial medial and lateral menisectomy;  Surgeon: Hessie Knows, MD;  Location: [facility] ORS;  Service: Orthopedics;  Laterality: Right;   Social History   Socioeconomic History  . Marital status: Married    Spouse name: Not on file  . Number  of children: Not on file  . Years of education: [employer]  . Highest education level: Not on file  Occupational History  . Occupation: Animal nutritionist  Social Needs  . Financial resource strain: Not on file  . Food insecurity:    Worry: Not on file    Inability: Not on file  . Transportation needs:    Medical: Not on file    Non-medical: Not on file  Tobacco Use  . Smoking status: Never Smoker  . Smokeless tobacco: Former Systems developer    Types: Chew, Snuff  . Tobacco comment: pt quit chewing tobacco 01/11/2006  Substance and Sexual Activity  . Alcohol use: Yes    Comment: occasionally, 1 per week  . Drug use: No  . Sexual activity: Not on file  Lifestyle  . Physical activity:    Days per week: Not on file    Minutes per session: Not on file  . Stress: Not on file  Relationships  . Social connections:    Talks on phone: Not on file    Gets together: Not on file    Attends religious service: Not on file    Active member of club or organization: Not on file    Attends meetings of clubs or organizations: Not on file    Relationship status: Not on file  . Intimate partner violence:    Fear of current or ex partner: Not on file    Emotionally abused: Not on file    Physically abused: Not on file    Forced sexual activity: Not on file  Other Topics Concern  . Not on file  Social History Narrative  . Not on file   Family History  Problem Relation Age of Onset  . Heart disease Mother   . Heart failure Mother   . Stroke Maternal Grandfather    Current Outpatient Medications on File Prior to Visit  Medication Sig  . aspirin EC 81 MG tablet Take 81 mg by mouth at bedtime.   . carvedilol (COREG) 12.5 MG tablet Take 12.5 mg 2 (two) times daily with a meal by mouth.  . Multiple Vitamins-Minerals (CENTRUM SILVER 50+MEN PO) Take by mouth.  . multivitamin-iron-minerals-folic acid (CENTRUM) chewable tablet Chew 1 tablet by mouth daily.  Marland Kitchen losartan (COZAAR) 25 MG tablet Take by  mouth.  . sildenafil (REVATIO) 20 MG tablet Take 1-5 pills about 30 min prior to sex. Start with 1 and increase as needed. (Patient not taking: Reported on 05/19/2018)   No current facility-administered medications on file prior to visit.     Review of Systems  Constitutional: Negative for activity change, appetite change, chills, diaphoresis, fatigue and fever.  HENT: Positive for postnasal drip and sore throat. Negative for congestion, hearing loss, sinus pressure, sneezing, trouble swallowing and voice change.   Eyes: Negative for visual disturbance.  Respiratory: Negative for apnea, cough, choking, chest tightness, shortness of breath and wheezing.   Cardiovascular: Negative for chest pain, palpitations and leg swelling.  Gastrointestinal: Negative for abdominal pain, anal bleeding, blood in stool, constipation, diarrhea,  nausea and vomiting.  Endocrine: Negative for cold intolerance and polyuria.  Genitourinary: Negative for decreased urine volume, difficulty urinating, dysuria, frequency, hematuria, testicular pain and urgency.       Improved nocturia  Musculoskeletal: Positive for arthralgias (none actively, R knee intermittent) and back pain (intermittent, none actively). Negative for neck pain.  Skin: Negative for rash.  Allergic/Immunologic: Positive for environmental allergies.  Neurological: Negative for dizziness, weakness, light-headedness, numbness and headaches.  Hematological: Negative for adenopathy.  Psychiatric/Behavioral: Negative for behavioral problems, dysphoric mood and sleep disturbance. The patient is not nervous/anxious.    Per HPI unless specifically indicated above    Objective:    BP 122/69   Pulse 62   Temp 98.5 F (36.9 C) (Oral)   Resp 16   Ht _0  (1.753 m)   Wt 219 lb 9.6 oz (99.6 kg)   BMI 32.43 kg/m   Wt Readings from Last 3 Encounters:  05/19/18 219 lb 9.6 oz (99.6 kg)  03/17/18 250 lb (113.4 kg)  11/03/17 294 lb (133.4 kg)    Physical  Exam  Constitutional: He is oriented to person, place, and time. He appears well-developed and well-nourished. No distress.  Well-appearing, comfortable, cooperative, improved dramatic weight loss, now obese, no longer morbid obese  HENT:  Head: Normocephalic and atraumatic.  Mouth/Throat: Oropharynx is clear and moist.  Frontal / maxillary sinuses non-tender. Nares patent without purulence or edema. Bilateral TMs clear without erythema, effusion or bulging. Oropharynx with mild postnasal drainage without erythema, exudates, edema or asymmetry.  Eyes: Pupils are equal, round, and reactive to light. Conjunctivae and EOM are normal. Right eye exhibits no discharge. Left eye exhibits no discharge.  Neck: Normal range of motion. Neck supple. No thyromegaly present.  No carotid bruits  Cardiovascular: Normal rate, regular rhythm, normal heart sounds and intact distal pulses.  No murmur heard. No ectopy identified  Pulmonary/Chest: Effort normal and breath sounds normal. No respiratory distress. He has no wheezes. He has no rales.  Abdominal: Soft. Bowel sounds are normal. He exhibits no distension and no mass. There is no tenderness.  Musculoskeletal: Normal range of motion. He exhibits no edema or tenderness.  Upper / Lower Extremities: - Normal muscle tone, strength bilateral upper extremities 5/5, lower extremities 5/5  Lymphadenopathy:    He has no cervical adenopathy.  Neurological: He is alert and oriented to person, place, and time.  Distal sensation intact to light touch all extremities  Skin: Skin is warm and dry. No rash noted. He is not diaphoretic. No erythema.  Psychiatric: He has a normal mood and affect. His behavior is normal.  Well groomed, good eye contact, normal speech and thoughts  Nursing note and vitals reviewed.  Results for orders placed or performed in visit on 05/12/18  PSA, Total with Reflex to PSA, Free  Result Value Ref Range   PSA, Total 0.9 < OR = 4.0 ng/mL    HIV antibody  Result Value Ref Range   HIV 1&2 Ab, 4th Generation NON-REACTIVE NON-REACTI  Hepatitis C antibody  Result Value Ref Range   Hepatitis C Ab NON-REACTIVE NON-REACTI   SIGNAL TO CUT-OFF 0.01 <1.00  TSH  Result Value Ref Range   TSH 0.98 0.40 - 4.50 mIU/L  CBC with Differential/Platelet  Result Value Ref Range   WBC 6.9 3.8 - 10.8 Thousand/uL   RBC 5.43 4.20 - 5.80 Million/uL   Hemoglobin 15.9 13.2 - 17.1 g/dL   HCT 46.4 38.5 - 50.0 %   MCV 85.5  80.0 - 100.0 fL   MCH 29.3 27.0 - 33.0 pg   MCHC 34.3 32.0 - 36.0 g/dL   RDW 14.6 11.0 - 15.0 %   Platelets 282 140 - 400 Thousand/uL   MPV 11.9 7.5 - 12.5 fL   Neutro Abs 5,120 1,500 - 7,800 cells/uL   Lymphs Abs 828 (L) 850 - 3,900 cells/uL   WBC mixed population 683 200 - 950 cells/uL   Eosinophils Absolute 207 15 - 500 cells/uL   Basophils Absolute 62 0 - 200 cells/uL   Neutrophils Relative % 74.2 %   Total Lymphocyte 12.0 %   Monocytes Relative 9.9 %   Eosinophils Relative 3.0 %   Basophils Relative 0.9 %  Lipid panel  Result Value Ref Range   Cholesterol 177 <200 mg/dL   HDL 41 >40 mg/dL   Triglycerides 146 <150 mg/dL   LDL Cholesterol (Calc) 110 (H) mg/dL (calc)   Total CHOL/HDL Ratio 4.3 <5.0 (calc)   Non-HDL Cholesterol (Calc) 136 (H) <130 mg/dL (calc)  Hemoglobin A1c  Result Value Ref Range   Hgb A1c MFr Bld 5.6 <5.7 % of total Hgb   Mean Plasma Glucose 114 (calc)   eAG (mmol/L) 6.3 (calc)  COMPLETE METABOLIC PANEL WITH GFR  Result Value Ref Range   Glucose, Bld 93 65 - 99 mg/dL   BUN 9 7 - 25 mg/dL   Creat 0.71 0.70 - 1.33 mg/dL   GFR, Est Non African American 106 > OR = 60 mL/min/1.4m   GFR, Est African American 122 > OR = 60 mL/min/1.743m  BUN/Creatinine Ratio NOT APPLICABLE 6 - 22 (calc)   Sodium 140 135 - 146 mmol/L   Potassium 4.2 3.5 - 5.3 mmol/L   Chloride 102 98 - 110 mmol/L   CO2 29 20 - 32 mmol/L   Calcium 9.4 8.6 - 10.3 mg/dL   Total Protein 6.1 6.1 - 8.1 g/dL   Albumin 4.1 3.6 -  5.1 g/dL   Globulin 2.0 1.9 - 3.7 g/dL (calc)   AG Ratio 2.1 1.0 - 2.5 (calc)   Total Bilirubin 0.6 0.2 - 1.2 mg/dL   Alkaline phosphatase (APISO) 79 40 - 115 U/L   AST 21 10 - 35 U/L   ALT 24 9 - 46 U/L      Assessment & Plan:   Problem List Items Addressed This Visit    Allergic rhinitis due to allergen    Likely cause of post nasal drainage and scratchy throat from recent dust exposure      Chronic pain of right knee    Stable chronic problem, OA/DJD S/p knee arthroscopy 02/2017, s/p meniscotomy per Dr MeRudene Christiansontinue to improve regular exercise as tolerated Follow-up with ortho in future if not improved w/ weight loss      Colon polyps    Reviewed last colonoscopy 02/2018 Dr AnVicente MalesGI Next due by report is 3 years      Elevated hemoglobin A1c    Mildly elevated A1c 5.6, no prior readings, but based on prior CBG from biometric seems his avg would have been closer to 6.0 Concern with obesity, HTN, HLD - however now dramatic weight loss 70-80 lb in 6 month  Plan:  1. Not on any therapy currently  2. Encourage improved lifestyle - low carb, low sugar diet, reduce portion size, continue improving regular exercise - handout given glycemic 3. Follow-up 6 months repeat A1c POC in office for trend       Hypertension  Well controlled, normal BP - improve due to dramatic weight loss 70-80 lbs in 6 months - Outside office readings at home improved including Cardiology - ACEi cough (1 year ago off ACEi) Complication with Systolic CHF NICM, AFib Followed by [facility] Cardiology Dr Clayborn Bigness    Plan:  1. Discussed Losartan recall - advised he is on low dose and it is decision up to him ultimately, he is not required to be on ARB due to any other particular reason, except history of cardiomyopathy - he may try next cutting tab Losartan 50m in half OR every other day, which is reasonable  - Continue Carvedilol 12.519mBID 2. Encourage improved lifestyle - low sodium diet, regular exercise -  keep up maintaining weight loss 3. Continue monitor BP outside office, bring readings to next visit, if persistently >140/90 or new symptoms notify office sooner 4. Follow-up q 6 months BP re-check, adjust med further - also offered Valsartan (Diovan) if need to switch to alternative ARB - or he may review again with Cardiology if can discontinue ARB      Obesity (BMI 30.0-34.9)    Dramatic weight loss improve 70 - 80 lbs in 6 months Encourage keep improving diet exercise lifestyle      Paroxysmal atrial fibrillation (HCC)    Stable well controlled on rate control, without recent flare Followed by Dr CaEdd Arbourardiology, last seen 04/2018 Continues on Carvedilol 12.71m571mID, Aspirin 81 now every other day - advised per Cards      Pure hypercholesterolemia    Controlled cholesterol on lifestyle weight loss Last lipid panel 04/2018 Calculated ASCVD 10 yr risk score 7.4%  Plan: 1. Discussion on ASCVD risk - somewhat higher with PAF, but controlled - reviewed not quite at indication for Statin 2. Continue ASA 50m56mn do daily or every other day for primary ASCVD risk reduction - discussed new ASA research, cardiology agrees to keep on this 3. Encourage improved lifestyle - low carb/cholesterol, reduce portion size, continue improving regular exercise 4. Follow-up 1 year lipids       Other Visit Diagnoses    Annual physical exam    -  Primary     Up to date health maintenance Reviewed lab results Encourage improve diet and exercise, keep up improving weight loss  No orders of the defined types were placed in this encounter.   Follow up plan: Return in about 6 months (around 11/19/2018) for Elevated A1c, Weight Check, HTN med adjust.  AlexNobie Putnam [place] 05/20/2018, 12:13 AM

## 2018-05-19 NOTE — Assessment & Plan Note (Signed)
Mildly elevated A1c 5.6, no prior readings, but based on prior CBG from biometric seems his avg would have been closer to 6.0 Concern with obesity, HTN, HLD - however now dramatic weight loss 70-80 lb in 6 month  Plan:  1. Not on any therapy currently  2. Encourage improved lifestyle - low carb, low sugar diet, reduce portion size, continue improving regular exercise - handout given glycemic 3. Follow-up 6 months repeat A1c POC in office for trend

## 2018-05-19 NOTE — Assessment & Plan Note (Signed)
Dramatic weight loss improve 70 - 80 lbs in 6 months Encourage keep improving diet exercise lifestyle

## 2018-05-19 NOTE — Assessment & Plan Note (Signed)
Controlled cholesterol on lifestyle weight loss Last lipid panel 04/2018 Calculated ASCVD 10 yr risk score 7.4%  Plan: 1. Discussion on ASCVD risk - somewhat higher with PAF, but controlled - reviewed not quite at indication for Statin 2. Continue ASA 81mg  can do daily or every other day for primary ASCVD risk reduction - discussed new ASA research, cardiology agrees to keep on this 3. Encourage improved lifestyle - low carb/cholesterol, reduce portion size, continue improving regular exercise 4. Follow-up 1 year lipids

## 2018-05-19 NOTE — Assessment & Plan Note (Signed)
Stable chronic problem, OA/DJD S/p knee arthroscopy 02/2017, s/p meniscotomy per Dr Rudene Christians Continue to improve regular exercise as tolerated Follow-up with ortho in future if not improved w/ weight loss

## 2018-05-19 NOTE — Assessment & Plan Note (Signed)
Well controlled, normal BP - improve due to dramatic weight loss 70-80 lbs in 6 months - Outside office readings at home improved including Cardiology - ACEi cough (1 year ago off ACEi) Complication with Systolic CHF NICM, AFib Followed by [facility] Cardiology Dr Clayborn Bigness    Plan:  1. Discussed Losartan recall - advised he is on low dose and it is decision up to him ultimately, he is not required to be on ARB due to any other particular reason, except history of cardiomyopathy - he may try next cutting tab Losartan 25mg  in half OR every other day, which is reasonable  - Continue Carvedilol 12.5mg  BID 2. Encourage improved lifestyle - low sodium diet, regular exercise - keep up maintaining weight loss 3. Continue monitor BP outside office, bring readings to next visit, if persistently >140/90 or new symptoms notify office sooner 4. Follow-up q 6 months BP re-check, adjust med further - also offered Valsartan (Diovan) if need to switch to alternative ARB - or he may review again with Cardiology if can discontinue ARB

## 2018-05-19 NOTE — Assessment & Plan Note (Signed)
Stable well controlled on rate control, without recent flare Followed by Dr Edd Arbour [facility], last seen 04/2018 Continues on Carvedilol 12.5mg  BID, Aspirin 81 now every other day - advised per Cards

## 2018-05-19 NOTE — Assessment & Plan Note (Signed)
Reviewed last colonoscopy 02/2018 Dr Vicente Males AGI Next due by report is 3 years

## 2018-05-19 NOTE — Patient Instructions (Addendum)
Thank you for coming to the office today.  For BP - it is very well controlled now after weight loss - We can reduce medicine to see if still well tolerated - Try cutting Losartan 25mg  tab in HALF - and take half daily - OR you can try EVERY OTHER DAY and then we can ask Dr Clayborn Bigness again next time to see if still need - Continue Carvedilol 12.5mg  twice daily  Check BP regularly to monitor progress on this, send message or call if questions  Or we can switch to alternative one that wasn't recalled - Valsartan (Diovan)  Keep taking Aspirin 81 - can leave it at [place] day if you prefer, or every day is fine - would not change until late to mid age 10s  Keep up the good work overall.  Please schedule a Follow-up Appointment to: Return in about 6 months (around 11/19/2018) for Elevated A1c, Weight Check, HTN med adjust.  If you have any other questions or concerns, please feel free to call the office or send a message through [place]. You may also schedule an earlier appointment if necessary.  Additionally, you may be receiving a survey about your experience at our office within a few days to 1 week by e-mail or mail. We value your feedback.  Nobie Putnam, DO [place]

## 2018-06-11 ENCOUNTER — Encounter: Payer: Self-pay | Admitting: Family Medicine

## 2018-10-06 ENCOUNTER — Ambulatory Visit (INDEPENDENT_AMBULATORY_CARE_PROVIDER_SITE_OTHER): Payer: BLUE CROSS/BLUE SHIELD

## 2018-10-06 DIAGNOSIS — Z23 Encounter for immunization: Secondary | ICD-10-CM

## 2018-10-06 MED ORDER — CARVEDILOL 12.5 MG PO TABS: 12.5000 mg | ORAL_TABLET | Freq: Two times a day (BID) | ORAL | 0 refills | Status: DC

## 2018-11-02 DIAGNOSIS — I10 Essential (primary) hypertension: Secondary | ICD-10-CM | POA: Diagnosis not present

## 2018-11-02 DIAGNOSIS — I2510 Atherosclerotic heart disease of native coronary artery without angina pectoris: Secondary | ICD-10-CM | POA: Diagnosis not present

## 2018-11-02 DIAGNOSIS — I480 Paroxysmal atrial fibrillation: Secondary | ICD-10-CM | POA: Diagnosis not present

## 2018-11-02 DIAGNOSIS — E782 Mixed hyperlipidemia: Secondary | ICD-10-CM | POA: Diagnosis not present

## 2018-11-16 ENCOUNTER — Ambulatory Visit: Payer: BLUE CROSS/BLUE SHIELD | Admitting: Family Medicine

## 2018-11-16 ENCOUNTER — Encounter: Payer: Self-pay | Admitting: Family Medicine

## 2018-11-16 ENCOUNTER — Other Ambulatory Visit: Payer: Self-pay | Admitting: Family Medicine

## 2018-11-16 VITALS — BP 126/67 | HR 47 | Temp 98.7°F | Resp 16 | Ht 69.0 in | Wt 167.0 lb

## 2018-11-16 DIAGNOSIS — I480 Paroxysmal atrial fibrillation: Secondary | ICD-10-CM | POA: Diagnosis not present

## 2018-11-16 DIAGNOSIS — I10 Essential (primary) hypertension: Secondary | ICD-10-CM

## 2018-11-16 DIAGNOSIS — Z0000 Encounter for general adult medical examination without abnormal findings: Secondary | ICD-10-CM

## 2018-11-16 DIAGNOSIS — R7309 Other abnormal glucose: Secondary | ICD-10-CM

## 2018-11-16 DIAGNOSIS — Z125 Encounter for screening for malignant neoplasm of prostate: Secondary | ICD-10-CM

## 2018-11-16 DIAGNOSIS — Z6824 Body mass index (BMI) 24.0-24.9, adult: Secondary | ICD-10-CM | POA: Diagnosis not present

## 2018-11-16 DIAGNOSIS — E7800 Pure hypercholesterolemia, unspecified: Secondary | ICD-10-CM

## 2018-11-16 LAB — POCT GLYCOSYLATED HEMOGLOBIN (HGB A1C): Hemoglobin A1C: 5.1 % (ref 4.0–5.6)

## 2018-11-16 NOTE — Assessment & Plan Note (Signed)
Normalized BMI now after continued dramatic weight loss, down 50 lbs in 6 months

## 2018-11-16 NOTE — Assessment & Plan Note (Signed)
Well controlled, normal BP - in large part due to significant wt loss in past 1 year >100-150 lbs Normal readings outside office OFF ACEi (cough), ARB - not needed Complication with Systolic CHF NICM, AFib Followed by [facility] Cardiology Dr Vincent Clements    Plan:  1. Continue Carvedilol 12.5mg  BID 2. Encourage improved lifestyle - low sodium diet, regular exercise - keep up maintaining weight loss 3. Continue monitor BP outside office, bring readings to next visit, if persistently >140/90 or new symptoms notify office sooner 4. Follow-up q 6 months yearly, may f/u with Cardiology to discuss other BB option

## 2018-11-16 NOTE — Assessment & Plan Note (Signed)
Further improved A1c 5.1, previously 5.6 Normal range Resolved obesity Controlled HTN.  Plan:  1. Not on any therapy currently  2. Encourage improved lifestyle - low carb, low sugar diet, reduce portion size, continue improving regular exercise 3. Follow-up 6 months yearly and labs

## 2018-11-16 NOTE — Patient Instructions (Addendum)
Thank you for coming to the office today.  Keep up the great work!  A1c 5.1 - excellent.  DUE for FASTING BLOOD WORK (no food or drink after midnight before the lab appointment, only water or coffee without cream/sugar on the morning of)  SCHEDULE "Lab Only" visit in the morning at the clinic for lab draw in 6 MONTHS   - Make sure Lab Only appointment is at about 1 week before your next appointment, so that results will be available  For Lab Results, once available within 2-3 days of blood draw, you can can log in to MyChart online to view your results and a brief explanation. Also, we can discuss results at next follow-up visit.   Please schedule a Follow-up Appointment to: Return in about 6 months (around 05/17/2019) for Annual Physical.  If you have any other questions or concerns, please feel free to call the office or send a message through [place]. You may also schedule an earlier appointment if necessary.  Additionally, you may be receiving a survey about your experience at our office within a few days to 1 week by e-mail or mail. We value your feedback.  Nobie Putnam, DO [place]

## 2018-11-16 NOTE — Assessment & Plan Note (Signed)
Stable, without active AFib Followed by Dr Callwood On BB 

## 2018-11-16 NOTE — Progress Notes (Signed)
Subjective:    Patient ID: Vincent Clements, male    DOB: [birth year], 57 y.o.   MRN: [id number]  Vincent Clements is a 57 y.o. male presenting on 11/16/2018 for Hypertension   HPI  Mild Elevated A1c without dx Pre-Diabetes  Prior history of elevated A1c >6 in past at work. Now he has improved lifestyle CBGs: Not checking Meds: never on med Reports good compliance. Tolerating well w/o side-effects Currently not on ACEi/ARB - off losartan Lifestyle: - Weight loss another 50 lbs in 6 months, previously was down 80 lbs in 6 months - Diet (continues to increase water intake, balanced diet, low carb, mostly oat/bran, avoids fast food, eats high protein some salmon fish, steak, peanuts) - Exercise (still active in general, some walking, he has issue with L knee problem if over-activity will affect his knee, better now with less weight) - Admits very brief episode of lightheaded or shakiness, but not checking sugars, quickly resolved Denies hypoglycemia  CHRONIC HTN: He has had improved BP control now with wt loss. He remains off Losartan for >6 months Current Meds - Carvedilol 12.5mg  BID - future his cardiologist considered Metoprolol Reports good compliance, took meds today. Tolerating well, w/o complaints. Denies CP, dyspnea, HA, edema, dizziness / lightheadedness  Atrial Fibrillation, Paroxysmal Followed by [facility] Cardiology Dr Clayborn Bigness, saw last 10/2018, followed now yearly, continues med management, remains off ARB, they continued BB He does not feel episodes of afib  Health Maintenance:  UTD FLU Vaccine 09/2018  Depression screen [facility] 2/9 11/16/2018 05/19/2018 11/03/2017  Decreased Interest 0 0 0  Down, Depressed, Hopeless 0 0 0  PHQ - 2 Score 0 0 0    Social History   Tobacco Use  . Smoking status: Never Smoker  . Smokeless tobacco: Former Systems developer    Types: Chew, Snuff  . Tobacco comment: pt quit chewing tobacco 01/11/2006  Substance Use Topics  . Alcohol use: Yes    Comment:  occasionally, 1 per week  . Drug use: No    Review of Systems Per HPI unless specifically indicated above     Objective:    BP 126/67   Pulse (!) 47   Temp 98.7 F (37.1 C) (Oral)   Resp 16   Ht 5\' 9"  (1.753 m)   Wt 167 lb (75.8 kg)   BMI 24.66 kg/m   Wt Readings from Last 3 Encounters:  11/16/18 167 lb (75.8 kg)  05/19/18 219 lb 9.6 oz (99.6 kg)  03/17/18 250 lb (113.4 kg)    Physical Exam  Constitutional: He is oriented to person, place, and time. He appears well-developed and well-nourished. No distress.  Well-appearing, comfortable, cooperative, normal body habitus after significant wt loss  HENT:  Head: Normocephalic and atraumatic.  Mouth/Throat: Oropharynx is clear and moist.  Eyes: Conjunctivae are normal. Right eye exhibits no discharge. Left eye exhibits no discharge.  Neck: Normal range of motion. Neck supple. No thyromegaly present.  Cardiovascular: Regular rhythm, normal heart sounds and intact distal pulses.  No murmur heard. Mild bradycardic, no ectopy  Pulmonary/Chest: Effort normal and breath sounds normal. No respiratory distress. He has no wheezes. He has no rales.  Musculoskeletal: Normal range of motion. He exhibits no edema.  Lymphadenopathy:    He has no cervical adenopathy.  Neurological: He is alert and oriented to person, place, and time.  Skin: Skin is warm and dry. No rash noted. He is not diaphoretic. No erythema.  Psychiatric: He has a normal mood and affect.  His behavior is normal.  Well groomed, good eye contact, normal speech and thoughts  Nursing note and vitals reviewed.    Recent Labs    05/12/18 0752 11/16/18 0818  HGBA1C 5.6 5.1    Results for orders placed or performed in visit on 11/16/18  POCT HgB A1C  Result Value Ref Range   Hemoglobin A1C 5.1 4.0 - 5.6 %      Assessment & Plan:   Problem List Items Addressed This Visit    Body mass index (BMI) 24.0-24.9, adult    Normalized BMI now after continued dramatic  weight loss, down 50 lbs in 6 months      Elevated hemoglobin A1c - Primary    Further improved A1c 5.1, previously 5.6 Normal range Resolved obesity Controlled HTN.  Plan:  1. Not on any therapy currently  2. Encourage improved lifestyle - low carb, low sugar diet, reduce portion size, continue improving regular exercise 3. Follow-up 6 months yearly and labs      Relevant Orders   POCT HgB A1C (Completed)   Essential hypertension    Well controlled, normal BP - in large part due to significant wt loss in past 1 year >100-150 lbs Normal readings outside office OFF ACEi (cough), ARB - not needed Complication with Systolic CHF NICM, AFib Followed by [facility] Cardiology Dr Clayborn Bigness    Plan:  1. Continue Carvedilol 12.5mg  BID 2. Encourage improved lifestyle - low sodium diet, regular exercise - keep up maintaining weight loss 3. Continue monitor BP outside office, bring readings to next visit, if persistently >140/90 or new symptoms notify office sooner 4. Follow-up q 6 months yearly, may f/u with Cardiology to discuss other BB option      Paroxysmal atrial fibrillation (HCC)    Stable, without active AFib Followed by Dr Clayborn Bigness On BB         No orders of the defined types were placed in this encounter.   Follow up plan: Return in about 6 months (around 05/17/2019) for Annual Physical.  Future labs ordered for 05/10/19  Nobie Putnam, [facility] 11/16/2018, 8:55 AM

## 2019-01-11 DIAGNOSIS — M25562 Pain in left knee: Secondary | ICD-10-CM | POA: Diagnosis not present

## 2019-01-11 DIAGNOSIS — M1711 Unilateral primary osteoarthritis, right knee: Secondary | ICD-10-CM | POA: Diagnosis not present

## 2019-01-31 ENCOUNTER — Other Ambulatory Visit: Payer: Self-pay | Admitting: Family Medicine

## 2019-05-10 ENCOUNTER — Other Ambulatory Visit: Payer: BLUE CROSS/BLUE SHIELD

## 2019-05-17 ENCOUNTER — Encounter: Payer: BLUE CROSS/BLUE SHIELD | Admitting: Family Medicine

## 2019-09-12 ENCOUNTER — Other Ambulatory Visit: Payer: Self-pay | Admitting: Family Medicine

## 2019-09-24 ENCOUNTER — Ambulatory Visit (INDEPENDENT_AMBULATORY_CARE_PROVIDER_SITE_OTHER): Payer: BC Managed Care – PPO

## 2019-09-24 ENCOUNTER — Other Ambulatory Visit: Payer: Self-pay

## 2019-09-24 DIAGNOSIS — Z23 Encounter for immunization: Secondary | ICD-10-CM | POA: Diagnosis not present

## 2019-10-29 ENCOUNTER — Telehealth: Payer: Self-pay

## 2019-10-29 DIAGNOSIS — R351 Nocturia: Secondary | ICD-10-CM

## 2019-10-29 DIAGNOSIS — R7309 Other abnormal glucose: Secondary | ICD-10-CM

## 2019-10-29 DIAGNOSIS — Z125 Encounter for screening for malignant neoplasm of prostate: Secondary | ICD-10-CM

## 2019-10-29 DIAGNOSIS — Z0000 Encounter for general adult medical examination without abnormal findings: Secondary | ICD-10-CM

## 2019-10-29 DIAGNOSIS — I10 Essential (primary) hypertension: Secondary | ICD-10-CM

## 2019-10-29 DIAGNOSIS — E7800 Pure hypercholesterolemia, unspecified: Secondary | ICD-10-CM

## 2019-10-29 NOTE — Telephone Encounter (Signed)
Signed lab orders  Nobie Putnam, [facility] 10/29/2019, 10:44 AM

## 2019-11-01 ENCOUNTER — Other Ambulatory Visit: Payer: BC Managed Care – PPO

## 2019-11-01 DIAGNOSIS — E7800 Pure hypercholesterolemia, unspecified: Secondary | ICD-10-CM | POA: Diagnosis not present

## 2019-11-01 DIAGNOSIS — Z0000 Encounter for general adult medical examination without abnormal findings: Secondary | ICD-10-CM

## 2019-11-01 DIAGNOSIS — R7309 Other abnormal glucose: Secondary | ICD-10-CM

## 2019-11-01 DIAGNOSIS — I10 Essential (primary) hypertension: Secondary | ICD-10-CM | POA: Diagnosis not present

## 2019-11-01 DIAGNOSIS — Z125 Encounter for screening for malignant neoplasm of prostate: Secondary | ICD-10-CM

## 2019-11-01 DIAGNOSIS — R351 Nocturia: Secondary | ICD-10-CM

## 2019-11-02 DIAGNOSIS — I10 Essential (primary) hypertension: Secondary | ICD-10-CM | POA: Diagnosis not present

## 2019-11-02 DIAGNOSIS — I2510 Atherosclerotic heart disease of native coronary artery without angina pectoris: Secondary | ICD-10-CM | POA: Diagnosis not present

## 2019-11-02 DIAGNOSIS — E782 Mixed hyperlipidemia: Secondary | ICD-10-CM | POA: Diagnosis not present

## 2019-11-02 DIAGNOSIS — I480 Paroxysmal atrial fibrillation: Secondary | ICD-10-CM | POA: Diagnosis not present

## 2019-11-02 LAB — CBC WITH DIFFERENTIAL/PLATELET
Absolute Monocytes: 660 cells/uL (ref 200–950)
Basophils Absolute: 79 cells/uL (ref 0–200)
Basophils Relative: 1.2 %
Eosinophils Absolute: 284 cells/uL (ref 15–500)
Eosinophils Relative: 4.3 %
HCT: 48.7 % (ref 38.5–50.0)
Hemoglobin: 16.5 g/dL (ref 13.2–17.1)
Lymphs Abs: 1043 cells/uL (ref 850–3900)
MCH: 30.9 pg (ref 27.0–33.0)
MCHC: 33.9 g/dL (ref 32.0–36.0)
MCV: 91.2 fL (ref 80.0–100.0)
MPV: 11.1 fL (ref 7.5–12.5)
Monocytes Relative: 10 %
Neutro Abs: 4534 cells/uL (ref 1500–7800)
Neutrophils Relative %: 68.7 %
Platelets: 312 10*3/uL (ref 140–400)
RBC: 5.34 10*6/uL (ref 4.20–5.80)
RDW: 12.8 % (ref 11.0–15.0)
Total Lymphocyte: 15.8 %
WBC: 6.6 10*3/uL (ref 3.8–10.8)

## 2019-11-02 LAB — COMPREHENSIVE METABOLIC PANEL
AG Ratio: 1.7 (calc) (ref 1.0–2.5)
ALT: 32 U/L (ref 9–46)
AST: 20 U/L (ref 10–35)
Albumin: 4.4 g/dL (ref 3.6–5.1)
Alkaline phosphatase (APISO): 56 U/L (ref 35–144)
BUN: 18 mg/dL (ref 7–25)
CO2: 27 mmol/L (ref 20–32)
Calcium: 9.7 mg/dL (ref 8.6–10.3)
Chloride: 103 mmol/L (ref 98–110)
Creat: 0.72 mg/dL (ref 0.70–1.33)
Globulin: 2.6 g/dL (calc) (ref 1.9–3.7)
Glucose, Bld: 121 mg/dL — ABNORMAL HIGH (ref 65–99)
Potassium: 5 mmol/L (ref 3.5–5.3)
Sodium: 141 mmol/L (ref 135–146)
Total Bilirubin: 0.3 mg/dL (ref 0.2–1.2)
Total Protein: 7 g/dL (ref 6.1–8.1)

## 2019-11-02 LAB — LIPID PANEL
Cholesterol: 265 mg/dL — ABNORMAL HIGH (ref <200)
HDL: 56 mg/dL (ref >=40)
LDL Cholesterol (Calc): 178 mg/dL (calc) — ABNORMAL HIGH
Non-HDL Cholesterol (Calc): 209 mg/dL (calc) — ABNORMAL HIGH (ref <130)
Total CHOL/HDL Ratio: 4.7 (calc) (ref <5.0)
Triglycerides: 160 mg/dL — ABNORMAL HIGH (ref <150)

## 2019-11-02 LAB — HEMOGLOBIN A1C
Hgb A1c MFr Bld: 5.3 % of total Hgb (ref <5.7)
Mean Plasma Glucose: 105 (calc)
eAG (mmol/L): 5.8 (calc)

## 2019-11-02 LAB — TSH: TSH: 0.8 mIU/L (ref 0.40–4.50)

## 2019-11-02 LAB — PSA: PSA: 0.8 ng/mL (ref <=4.0)

## 2019-11-08 ENCOUNTER — Ambulatory Visit (INDEPENDENT_AMBULATORY_CARE_PROVIDER_SITE_OTHER): Payer: BC Managed Care – PPO | Admitting: Family Medicine

## 2019-11-08 ENCOUNTER — Other Ambulatory Visit: Payer: Self-pay

## 2019-11-08 ENCOUNTER — Encounter: Payer: Self-pay | Admitting: Family Medicine

## 2019-11-08 VITALS — BP 130/70 | HR 66 | Temp 97.8°F | Resp 18 | Ht 69.0 in | Wt 234.5 lb

## 2019-11-08 DIAGNOSIS — I10 Essential (primary) hypertension: Secondary | ICD-10-CM

## 2019-11-08 DIAGNOSIS — Z0000 Encounter for general adult medical examination without abnormal findings: Secondary | ICD-10-CM | POA: Diagnosis not present

## 2019-11-08 DIAGNOSIS — E7800 Pure hypercholesterolemia, unspecified: Secondary | ICD-10-CM | POA: Diagnosis not present

## 2019-11-08 DIAGNOSIS — I480 Paroxysmal atrial fibrillation: Secondary | ICD-10-CM

## 2019-11-08 DIAGNOSIS — R7309 Other abnormal glucose: Secondary | ICD-10-CM

## 2019-11-08 DIAGNOSIS — E669 Obesity, unspecified: Secondary | ICD-10-CM

## 2019-11-08 NOTE — Progress Notes (Signed)
Subjective:    Patient ID: Vincent Clements, male    DOB: 1961/03/04, 58 y.o.   MRN: [id number]  Vincent Clements is a 58 y.o. male presenting on 11/08/2019 for Annual Exam   HPI   Here for Annual Physical and Lab Review.  Mild Elevated A1c without dx Pre-Diabetes  Last A1c 5.3. No new concerns. CBGs:Not checking Meds:never on med Reports good compliance. Tolerating well w/o side-effects Currently not on ACEi/ARB - off losartan Lifestyle: - Weight gained 60 lbs in 1 year, previous history fluctuating weight gain and loss - Diet (goal to increase water intake, goal to limit red meat and improve diet, inc vegetables) - Exercise (active but limited exercise at times with knee, good days and bad days) Denies hypoglycemia  CHRONIC HTN: He has had elevated BP at home, checks regularly avg 120-140s He remains off Losartan still, cardiology said they could hold if he can lose weight again Current Meds -Carvedilol 12.5mg  BID Reports good compliance, took meds today. Tolerating well, w/o complaints. Denies CP, dyspnea, HA, edema, dizziness / lightheadedness  Atrial Fibrillation, Paroxysmal Followed by [facility] Cardiology Dr Clayborn Bigness, followed now yearly, continues med management, remains off ARB, they continued BB He does not feel episodes of afib  Health Maintenance:  Prostate CA Screening: No prior DRE. Last PSA 0.8 (10/2019). Currently asymptomatic except for mild nocturia seems improved after weight loss, but he drinks frequent water. Denies BPH LUTS. No known family history of prostate CA.   Routine Hep C and HIV screening were negative.  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 Currently asymptomatic. No known family history of colon CA. Next due 02/2021  Also asking about Shingrix, he is due for this he had prior severe chicken pox, never had shingles.   Depression screen [facility] 2/9 11/08/2019 11/16/2018 05/19/2018  Decreased Interest 0 0 0  Down, Depressed, Hopeless 0 0 0  PHQ - 2 Score 0 0 0    Past Medical History:  Diagnosis Date  . Arthritis   . Hypertension    Past Surgical History:  Procedure Laterality Date  . CHOLECYSTECTOMY  10/06/2006  . COLONOSCOPY WITH PROPOFOL N/A 03/17/2018   Procedure: COLONOSCOPY WITH PROPOFOL;  Surgeon: [name], MD;  Location: [facility] ENDOSCOPY;  Service: Gastroenterology;  Laterality: N/A;  . KNEE ARTHROSCOPY Right 02/27/2017   Procedure: right knee arthroscopy, partial medial and lateral menisectomy;  Surgeon: Hessie Knows, MD;  Location: [facility] ORS;  Service: Orthopedics;  Laterality: Right;   Social History   Socioeconomic History  . Marital status: Married    Spouse name: Not on file  . Number of children: Not on file  . Years of education: [employer]  . Highest education level: Not on file  Occupational History  . Occupation: Animal nutritionist  Social Needs  . Financial resource strain: Not on file  . Food insecurity    Worry: Not on file    Inability: Not on file  . Transportation needs    Medical: Not on file    Non-medical: Not on file  Tobacco Use  . Smoking status: Never Smoker  . Smokeless tobacco: Former Systems developer    Types: Chew, Snuff  . Tobacco comment: pt quit chewing tobacco 01/11/2006  Substance and Sexual Activity  . Alcohol use: Yes    Comment: occasionally, 1 per week  . Drug use: No  .  Sexual activity: Not on file  Lifestyle  . Physical activity    Days per week: Not on file    Minutes per session: Not on file  . Stress: Not on file  Relationships  . Social Herbalist on phone: Not on file    Gets together: Not on file    Attends religious service: Not on file    Active member of club or organization: Not on file    Attends meetings of clubs or organizations: Not on file    Relationship status: Not on file  . Intimate partner violence    Fear of  current or ex partner: Not on file    Emotionally abused: Not on file    Physically abused: Not on file    Forced sexual activity: Not on file  Other Topics Concern  . Not on file  Social History Narrative  . Not on file   Family History  Problem Relation Age of Onset  . Heart disease Mother   . Heart failure Mother   . Stroke Maternal Grandfather    Current Outpatient Medications on File Prior to Visit  Medication Sig  . aspirin EC 81 MG tablet Take 81 mg by mouth at bedtime.   . carvedilol (COREG) 12.5 MG tablet TAKE 1 TABLET TWICE A DAY WITH MEALS  . Multiple Vitamins-Minerals (CENTRUM SILVER 50+MEN PO) Take by mouth.  . sildenafil (REVATIO) 20 MG tablet Take 1-5 pills about 30 min prior to sex. Start with 1 and increase as needed.   No current facility-administered medications on file prior to visit.     Review of Systems  Constitutional: Negative for activity change, appetite change, chills, diaphoresis, fatigue, fever and unexpected weight change.  HENT: Negative for congestion and hearing loss.   Eyes: Negative for visual disturbance.  Respiratory: Negative for apnea, cough, choking, chest tightness, shortness of breath and wheezing.   Cardiovascular: Negative for chest pain, palpitations and leg swelling.  Gastrointestinal: Negative for abdominal pain, anal bleeding, blood in stool, constipation, diarrhea, nausea and vomiting.  Endocrine: Negative for cold intolerance.  Genitourinary: Negative for difficulty urinating, dysuria, frequency and hematuria.  Musculoskeletal: Negative for arthralgias, back pain and neck pain.  Skin: Negative for rash.  Allergic/Immunologic: Negative for environmental allergies.  Neurological: Negative for dizziness, weakness, light-headedness, numbness and headaches.  Hematological: Negative for adenopathy.  Psychiatric/Behavioral: Negative for behavioral problems, dysphoric mood and sleep disturbance. The patient is not nervous/anxious.     Per HPI unless specifically indicated above      Objective:    BP 130/70 (BP Location: Left Arm, Cuff Size: Normal)   Pulse 66   Temp 97.8 F (36.6 C) (Oral)   Resp 18   Ht 5\' 9"  (1.753 m)   Wt 234 lb 8 oz (106.4 kg)   SpO2 98%   BMI 34.63 kg/m   Wt Readings from Last 3 Encounters:  11/08/19 234 lb 8 oz (106.4 kg)  11/16/18 167 lb (75.8 kg)  05/19/18 219 lb 9.6 oz (99.6 kg)    Physical Exam Vitals signs and nursing note reviewed.  Constitutional:      General: He is not in acute distress.    Appearance: He is well-developed. He is not diaphoretic.     Comments: Well-appearing, comfortable, cooperative, obesity weight gain  HENT:     Head: Normocephalic and atraumatic.  Eyes:     General:        Right eye: No discharge.  Left eye: No discharge.     Conjunctiva/sclera: Conjunctivae normal.     Pupils: Pupils are equal, round, and reactive to light.  Neck:     Musculoskeletal: Normal range of motion and neck supple.     Thyroid: No thyromegaly.  Cardiovascular:     Rate and Rhythm: Normal rate and regular rhythm.     Heart sounds: Normal heart sounds. No murmur.  Pulmonary:     Effort: Pulmonary effort is normal. No respiratory distress.     Breath sounds: Normal breath sounds. No wheezing or rales.  Abdominal:     General: Bowel sounds are normal. There is no distension.     Palpations: Abdomen is soft. There is no mass.     Tenderness: There is no abdominal tenderness.  Musculoskeletal: Normal range of motion.        General: No tenderness.     Comments: Upper / Lower Extremities: - Normal muscle tone, strength bilateral upper extremities 5/5, lower extremities 5/5  Lymphadenopathy:     Cervical: No cervical adenopathy.  Skin:    General: Skin is warm and dry.     Findings: No erythema or rash.  Neurological:     Mental Status: He is alert and oriented to person, place, and time.     Comments: Distal sensation intact to light touch all extremities   Psychiatric:        Behavior: Behavior normal.     Comments: Well groomed, good eye contact, normal speech and thoughts        Results for orders placed or performed in visit on 11/01/19  Lipid panel  Result Value Ref Range   Cholesterol 265 (H) <200 mg/dL   HDL 56 > OR = 40 mg/dL   Triglycerides 160 (H) <150 mg/dL   LDL Cholesterol (Calc) 178 (H) mg/dL (calc)   Total CHOL/HDL Ratio 4.7 <5.0 (calc)   Non-HDL Cholesterol (Calc) 209 (H) <130 mg/dL (calc)  PSA  Result Value Ref Range   PSA 0.8 < OR = 4.0 ng/mL  Comprehensive metabolic panel  Result Value Ref Range   Glucose, Bld 121 (H) 65 - 99 mg/dL   BUN 18 7 - 25 mg/dL   Creat 0.72 0.70 - 1.33 mg/dL   BUN/Creatinine Ratio NOT APPLICABLE 6 - 22 (calc)   Sodium 141 135 - 146 mmol/L   Potassium 5.0 3.5 - 5.3 mmol/L   Chloride 103 98 - 110 mmol/L   CO2 27 20 - 32 mmol/L   Calcium 9.7 8.6 - 10.3 mg/dL   Total Protein 7.0 6.1 - 8.1 g/dL   Albumin 4.4 3.6 - 5.1 g/dL   Globulin 2.6 1.9 - 3.7 g/dL (calc)   AG Ratio 1.7 1.0 - 2.5 (calc)   Total Bilirubin 0.3 0.2 - 1.2 mg/dL   Alkaline phosphatase (APISO) 56 35 - 144 U/L   AST 20 10 - 35 U/L   ALT 32 9 - 46 U/L  CBC with Differential/Platelet  Result Value Ref Range   WBC 6.6 3.8 - 10.8 Thousand/uL   RBC 5.34 4.20 - 5.80 Million/uL   Hemoglobin 16.5 13.2 - 17.1 g/dL   HCT 48.7 38.5 - 50.0 %   MCV 91.2 80.0 - 100.0 fL   MCH 30.9 27.0 - 33.0 pg   MCHC 33.9 32.0 - 36.0 g/dL   RDW 12.8 11.0 - 15.0 %   Platelets 312 140 - 400 Thousand/uL   MPV 11.1 7.5 - 12.5 fL   Neutro Abs 4,534 1,500 -  7,800 cells/uL   Lymphs Abs 1,043 850 - 3,900 cells/uL   Absolute Monocytes 660 200 - 950 cells/uL   Eosinophils Absolute 284 15 - 500 cells/uL   Basophils Absolute 79 0 - 200 cells/uL   Neutrophils Relative % 68.7 %   Total Lymphocyte 15.8 %   Monocytes Relative 10.0 %   Eosinophils Relative 4.3 %   Basophils Relative 1.2 %  Hemoglobin A1c  Result Value Ref Range   Hgb A1c MFr Bld  5.3 <5.7 % of total Hgb   Mean Plasma Glucose 105 (calc)   eAG (mmol/L) 5.8 (calc)  TSH  Result Value Ref Range   TSH 0.80 0.40 - 4.50 mIU/L      Assessment & Plan:   Problem List Items Addressed This Visit    Pure hypercholesterolemia    Elevated lipids now with weight gain again. Calculated ASCVD 10 yr risk score 7-8%  Plan: 1. Discussion on ASCVD risk - somewhat higher with PAF, but controlled - reviewed not quite at indication for Statin - he can discuss with his Cardiologist as well in future. 2. Continue ASA 81mg  can do daily or every other day for primary ASCVD risk reduction  3. Encourage improved lifestyle - low carb/cholesterol, reduce portion size, continue improving regular exercise 4. Follow-up 1 year lipids      Paroxysmal atrial fibrillation (HCC)    Stable, without active AFib Followed by Dr Clayborn Bigness On BB      Obesity (BMI 30.0-34.9)    Abnormal weight gain 60 + gain in 1 year Seems to have large weight change fluctuation in past Encourage resume lifestyle diet exercise      Essential hypertension    Improved BP on re-check, was elevated. Attributed to weight gain now Normal readings outside office OFF ACEi (cough), ARB Complication with Systolic CHF NICM, AFib Followed by [facility] Cardiology Dr Clayborn Bigness    Plan:  1. Continue Carvedilol 12.5mg  BID 2. Encourage improved lifestyle - low sodium diet, regular exercise - keep up maintaining weight loss 3. Continue monitor BP outside office, bring readings to next visit, if persistently >140/90 or new symptoms notify office sooner  Future if elevated BP, reconsider ARB if indicated      Elevated hemoglobin A1c    A1c up to 5.3, well controlled Normal range Controlled HTN.  Plan:  1. Not on any therapy currently  2. Encourage improved lifestyle - low carb, low sugar diet, reduce portion size, continue improving regular exercise  Check yearly       Other Visit Diagnoses    Annual physical exam    -   Primary      Updated Health Maintenance information Reviewed recent lab results with patient Encouraged improvement to lifestyle with diet and exercise - Goal of weight loss as discussed.   No orders of the defined types were placed in this encounter.   Follow up plan: Return in about 6 months (around 05/07/2020) for 6 month follow-up HTN, Weight.  Nobie Putnam, DO [facility] 11/08/2019, 8:53 AM

## 2019-11-08 NOTE — Assessment & Plan Note (Signed)
Elevated lipids now with weight gain again. Calculated ASCVD 10 yr risk score 7-8%  Plan: 1. Discussion on ASCVD risk - somewhat higher with PAF, but controlled - reviewed not quite at indication for Statin - he can discuss with his Cardiologist as well in future. 2. Continue ASA 81mg  can do daily or every other day for primary ASCVD risk reduction  3. Encourage improved lifestyle - low carb/cholesterol, reduce portion size, continue improving regular exercise 4. Follow-up 1 year lipids

## 2019-11-08 NOTE — Patient Instructions (Addendum)
Thank you for coming to the office today.  Try to resume diet and lifestyle changes to lower weight and control BP  Improved on re-check  Goal to lower cholesterol in diet  If indigestion - start OTC Omeprazole/Prilosec 20mg  daily before first meal of the day, for 2-4 weeks then notify if you need a rx for longer.  Recommend Shingrix x 2 dose series at pharmacy   Please schedule a Follow-up Appointment to: Return in about 6 months (around 05/07/2020) for 6 month follow-up HTN, Weight.  If you have any other questions or concerns, please feel free to call the office or send a message through [facility]. You may also schedule an earlier appointment if necessary.  Additionally, you may be receiving a survey about your experience at our office within a few days to 1 week by e-mail or mail. We value your feedback.  Nobie Putnam, DO [facility]

## 2019-11-08 NOTE — Assessment & Plan Note (Signed)
Abnormal weight gain 60 + gain in 1 year Seems to have large weight change fluctuation in past Encourage resume lifestyle diet exercise

## 2019-11-08 NOTE — Assessment & Plan Note (Signed)
Stable, without active AFib Followed by Dr Callwood On BB 

## 2019-11-08 NOTE — Assessment & Plan Note (Signed)
Improved BP on re-check, was elevated. Attributed to weight gain now Normal readings outside office OFF ACEi (cough), ARB Complication with Systolic CHF NICM, AFib Followed by [facility] Cardiology Dr Clayborn Bigness    Plan:  1. Continue Carvedilol 12.5mg  BID 2. Encourage improved lifestyle - low sodium diet, regular exercise - keep up maintaining weight loss 3. Continue monitor BP outside office, bring readings to next visit, if persistently >140/90 or new symptoms notify office sooner  Future if elevated BP, reconsider ARB if indicated

## 2019-11-08 NOTE — Assessment & Plan Note (Signed)
A1c up to 5.3, well controlled Normal range Controlled HTN.  Plan:  1. Not on any therapy currently  2. Encourage improved lifestyle - low carb, low sugar diet, reduce portion size, continue improving regular exercise  Check yearly

## 2020-05-03 DIAGNOSIS — I519 Heart disease, unspecified: Secondary | ICD-10-CM | POA: Diagnosis not present

## 2020-05-03 DIAGNOSIS — Z8639 Personal history of other endocrine, nutritional and metabolic disease: Secondary | ICD-10-CM | POA: Diagnosis not present

## 2020-05-03 DIAGNOSIS — R42 Dizziness and giddiness: Secondary | ICD-10-CM | POA: Diagnosis not present

## 2020-05-03 DIAGNOSIS — I10 Essential (primary) hypertension: Secondary | ICD-10-CM | POA: Diagnosis not present

## 2020-05-08 ENCOUNTER — Encounter: Payer: Self-pay | Admitting: Family Medicine

## 2020-05-08 ENCOUNTER — Ambulatory Visit (INDEPENDENT_AMBULATORY_CARE_PROVIDER_SITE_OTHER): Payer: BC Managed Care – PPO | Admitting: Family Medicine

## 2020-05-08 ENCOUNTER — Other Ambulatory Visit: Payer: Self-pay | Admitting: Family Medicine

## 2020-05-08 ENCOUNTER — Other Ambulatory Visit: Payer: Self-pay

## 2020-05-08 VITALS — BP 116/64 | HR 65 | Temp 97.8°F | Resp 16 | Ht 69.0 in | Wt 187.0 lb

## 2020-05-08 DIAGNOSIS — R7309 Other abnormal glucose: Secondary | ICD-10-CM

## 2020-05-08 DIAGNOSIS — E7800 Pure hypercholesterolemia, unspecified: Secondary | ICD-10-CM

## 2020-05-08 DIAGNOSIS — M25561 Pain in right knee: Secondary | ICD-10-CM

## 2020-05-08 DIAGNOSIS — I480 Paroxysmal atrial fibrillation: Secondary | ICD-10-CM

## 2020-05-08 DIAGNOSIS — E663 Overweight: Secondary | ICD-10-CM

## 2020-05-08 DIAGNOSIS — I10 Essential (primary) hypertension: Secondary | ICD-10-CM

## 2020-05-08 DIAGNOSIS — G8929 Other chronic pain: Secondary | ICD-10-CM

## 2020-05-08 DIAGNOSIS — Z0000 Encounter for general adult medical examination without abnormal findings: Secondary | ICD-10-CM

## 2020-05-08 DIAGNOSIS — R351 Nocturia: Secondary | ICD-10-CM

## 2020-05-08 NOTE — Progress Notes (Signed)
Subjective:    Patient ID: Vincent Clements, male    DOB: 02-07-1961, 59 y.o.   MRN: [id number]  Vincent Clements is a 59 y.o. male presenting on 05/08/2020 for Hypertension   HPI   History of Mild Elevated A1c Last result A1c 5.3, not due for A1c today.  CHRONIC HTN: Improved BP overall. He has had weight loss. BP readings 110-120s. He remains off Losartan still Current Meds -Carvedilol 12.5mg  BID Reports good compliance, took meds today. Tolerating well, w/o complaints. Denies CP, dyspnea, HA, edema, dizziness / lightheadedness  Overweight BMI >27 Significant weight loss in 6-7 months, from 230 lbs down to 180 lbs. He has improved lifestyle diet and has improved overall with improved diet choices, with no more red meat and other bad choices now eating overall better. - Diet (goal to increase water intake, avoiding red meat and improve diet, inc vegetables) - Exercise (active but limited exercise at times with knee, good days and bad days)  Atrial Fibrillation, Paroxysmal Followed by [facility] Cardiology Dr Clayborn Bigness, followed now yearly, continues med management, remains off ARB, they continued BB He does not feel episodes of afib He has upcoming July 2021 ECHOcardiogram. They will adjust medication if needed.  Chronic Knee / Back Pain / Osteoarthritis He still has chronic R knee pain episodes. Some days better than others. Worse if over active. Improve with rest. Also back pain. He has followed ortho. He has had prior meniscus repair arthroscopic. He would need a future replacement, goal to avoid surgery or delay.  Health Maintenance: Waiting for his wife to get COVID19 vaccine after her mammogram and he will get them.  Depression screen [facility] 2/9 05/08/2020 11/08/2019 11/16/2018  Decreased Interest 0 0 0  Down, Depressed, Hopeless 0 0 0  PHQ - 2 Score 0 0 0    Social History   Tobacco Use  . Smoking status: Never Smoker  . Smokeless tobacco: Former Systems developer    Types: Chew, Snuff  .  Tobacco comment: pt quit chewing tobacco 01/11/2006  Substance Use Topics  . Alcohol use: Yes    Comment: occasionally, 1 per week  . Drug use: No    Review of Systems Per HPI unless specifically indicated above     Objective:    BP 116/64   Pulse 65   Temp 97.8 F (36.6 C) (Temporal)   Resp 16   Ht 5\' 9"  (1.753 m)   Wt 187 lb (84.8 kg)   SpO2 98%   BMI 27.62 kg/m   Wt Readings from Last 3 Encounters:  05/08/20 187 lb (84.8 kg)  11/08/19 234 lb 8 oz (106.4 kg)  11/16/18 167 lb (75.8 kg)    Physical Exam Vitals and nursing note reviewed.  Constitutional:      General: He is not in acute distress.    Appearance: He is well-developed. He is not diaphoretic.     Comments: Well-appearing, comfortable, cooperative  HENT:     Head: Normocephalic and atraumatic.  Eyes:     General:        Right eye: No discharge.        Left eye: No discharge.     Conjunctiva/sclera: Conjunctivae normal.  Cardiovascular:     Rate and Rhythm: Normal rate and regular rhythm.     Pulses: Normal pulses.     Heart sounds: No murmur.  Pulmonary:     Effort: Pulmonary effort is normal.  Skin:    General: Skin is warm and  dry.     Findings: No erythema or rash.  Neurological:     Mental Status: He is alert and oriented to person, place, and time.  Psychiatric:        Behavior: Behavior normal.     Comments: Well groomed, good eye contact, normal speech and thoughts        Results for orders placed or performed in visit on 11/01/19  Lipid panel  Result Value Ref Range   Cholesterol 265 (H) <200 mg/dL   HDL 56 > OR = 40 mg/dL   Triglycerides 160 (H) <150 mg/dL   LDL Cholesterol (Calc) 178 (H) mg/dL (calc)   Total CHOL/HDL Ratio 4.7 <5.0 (calc)   Non-HDL Cholesterol (Calc) 209 (H) <130 mg/dL (calc)  PSA  Result Value Ref Range   PSA 0.8 < OR = 4.0 ng/mL  Comprehensive metabolic panel  Result Value Ref Range   Glucose, Bld 121 (H) 65 - 99 mg/dL   BUN 18 7 - 25 mg/dL   Creat  0.72 0.70 - 1.33 mg/dL   BUN/Creatinine Ratio NOT APPLICABLE 6 - 22 (calc)   Sodium 141 135 - 146 mmol/L   Potassium 5.0 3.5 - 5.3 mmol/L   Chloride 103 98 - 110 mmol/L   CO2 27 20 - 32 mmol/L   Calcium 9.7 8.6 - 10.3 mg/dL   Total Protein 7.0 6.1 - 8.1 g/dL   Albumin 4.4 3.6 - 5.1 g/dL   Globulin 2.6 1.9 - 3.7 g/dL (calc)   AG Ratio 1.7 1.0 - 2.5 (calc)   Total Bilirubin 0.3 0.2 - 1.2 mg/dL   Alkaline phosphatase (APISO) 56 35 - 144 U/L   AST 20 10 - 35 U/L   ALT 32 9 - 46 U/L  CBC with Differential/Platelet  Result Value Ref Range   WBC 6.6 3.8 - 10.8 Thousand/uL   RBC 5.34 4.20 - 5.80 Million/uL   Hemoglobin 16.5 13.2 - 17.1 g/dL   HCT 48.7 38.5 - 50.0 %   MCV 91.2 80.0 - 100.0 fL   MCH 30.9 27.0 - 33.0 pg   MCHC 33.9 32.0 - 36.0 g/dL   RDW 12.8 11.0 - 15.0 %   Platelets 312 140 - 400 Thousand/uL   MPV 11.1 7.5 - 12.5 fL   Neutro Abs 4,534 1,500 - 7,800 cells/uL   Lymphs Abs 1,043 850 - 3,900 cells/uL   Absolute Monocytes 660 200 - 950 cells/uL   Eosinophils Absolute 284 15 - 500 cells/uL   Basophils Absolute 79 0 - 200 cells/uL   Neutrophils Relative % 68.7 %   Total Lymphocyte 15.8 %   Monocytes Relative 10.0 %   Eosinophils Relative 4.3 %   Basophils Relative 1.2 %  Hemoglobin A1c  Result Value Ref Range   Hgb A1c MFr Bld 5.3 <5.7 % of total Hgb   Mean Plasma Glucose 105 (calc)   eAG (mmol/L) 5.8 (calc)  TSH  Result Value Ref Range   TSH 0.80 0.40 - 4.50 mIU/L      Assessment & Plan:   Problem List Items Addressed This Visit    Paroxysmal atrial fibrillation (HCC)    Stable, without active AFib Followed by Dr Clayborn Bigness On BB      Overweight (BMI 25.0-29.9)    Dramatic weight loss down 50 lbs in 6 months Lifestyle diet changes      Essential hypertension - Primary    Improved BP Normal readings outside office OFF ACEi (cough), ARB Complication with Systolic CHF NICM,  AFib Followed by [facility] Cardiology Dr Clayborn Bigness    Plan:  1. Continue Carvedilol  12.5mg  BID - Cardiology may consider dose reduction in future 2. Encourage improved lifestyle - low sodium diet, regular exercise - keep up maintaining weight loss 3. Continue monitor BP outside office, bring readings to next visit, if persistently >140/90 or new symptoms notify office sooner      Elevated hemoglobin A1c    Previously A1c up to 5.3, well controlled Improved weight loss Controlled HTN.  Plan:  1. Not on any therapy currently  2. Encourage improved lifestyle - low carb, low sugar diet, reduce portion size, continue improving regular exercise  Check yearly - next due 10/2020      Chronic pain of right knee    Stable chronic problem, OA/DJD S/p knee arthroscopy 02/2017, s/p meniscotomy per Dr Rudene Christians Continue to improve regular exercise as tolerated Follow-up with ortho in future if not improved or we can discuss options here. Goal to delay surgery         No orders of the defined types were placed in this encounter.    Follow up plan: Return in about 6 months (around 11/08/2020) for Annual Physical.  Future labs ordered for 11/01/20   Nobie Putnam, [facility] 05/08/2020, 8:35 AM

## 2020-05-08 NOTE — Patient Instructions (Addendum)
Thank you for coming to the office today.  BP is improved.  DUE for FASTING BLOOD WORK (no food or drink after midnight before the lab appointment, only water or coffee without cream/sugar on the morning of)  SCHEDULE "Lab Only" visit in the morning at the clinic for lab draw in 6 MONTHS   - Make sure Lab Only appointment is at about 1 week before your next appointment, so that results will be available  For Lab Results, once available within 2-3 days of blood draw, you can can log in to MyChart online to view your results and a brief explanation. Also, we can discuss results at next follow-up visit.   Please schedule a Follow-up Appointment to: Return in about 6 months (around 11/08/2020) for Annual Physical.  If you have any other questions or concerns, please feel free to call the office or send a message through [place]. You may also schedule an earlier appointment if necessary.  Additionally, you may be receiving a survey about your experience at our office within a few days to 1 week by e-mail or mail. We value your feedback.  Nobie Putnam, DO [place]

## 2020-05-08 NOTE — Assessment & Plan Note (Signed)
Stable, without active AFib Followed by Dr Callwood On BB 

## 2020-05-08 NOTE — Assessment & Plan Note (Signed)
Dramatic weight loss down 50 lbs in 6 months Lifestyle diet changes

## 2020-05-08 NOTE — Assessment & Plan Note (Signed)
Previously A1c up to 5.3, well controlled Improved weight loss Controlled HTN.  Plan:  1. Not on any therapy currently  2. Encourage improved lifestyle - low carb, low sugar diet, reduce portion size, continue improving regular exercise  Check yearly - next due 10/2020

## 2020-05-08 NOTE — Assessment & Plan Note (Signed)
Stable chronic problem, OA/DJD S/p knee arthroscopy 02/2017, s/p meniscotomy per Dr Rudene Christians Continue to improve regular exercise as tolerated Follow-up with ortho in future if not improved or we can discuss options here. Goal to delay surgery

## 2020-05-08 NOTE — Assessment & Plan Note (Addendum)
Improved BP Normal readings outside office OFF ACEi (cough), ARB Complication with Systolic CHF NICM, AFib Followed by [facility] Cardiology Dr Clayborn Bigness    Plan:  1. Continue Carvedilol 12.5mg  BID - Cardiology may consider dose reduction in future 2. Encourage improved lifestyle - low sodium diet, regular exercise - keep up maintaining weight loss 3. Continue monitor BP outside office, bring readings to next visit, if persistently >140/90 or new symptoms notify office sooner

## 2020-05-22 NOTE — Telephone Encounter (Signed)
Documented

## 2020-06-28 DIAGNOSIS — I519 Heart disease, unspecified: Secondary | ICD-10-CM | POA: Diagnosis not present

## 2020-07-19 DIAGNOSIS — I2510 Atherosclerotic heart disease of native coronary artery without angina pectoris: Secondary | ICD-10-CM | POA: Diagnosis not present

## 2020-07-19 DIAGNOSIS — I10 Essential (primary) hypertension: Secondary | ICD-10-CM | POA: Diagnosis not present

## 2020-07-19 DIAGNOSIS — Z8639 Personal history of other endocrine, nutritional and metabolic disease: Secondary | ICD-10-CM | POA: Diagnosis not present

## 2020-07-19 DIAGNOSIS — R42 Dizziness and giddiness: Secondary | ICD-10-CM | POA: Diagnosis not present

## 2020-08-10 DIAGNOSIS — I480 Paroxysmal atrial fibrillation: Secondary | ICD-10-CM | POA: Diagnosis not present

## 2020-08-10 DIAGNOSIS — I2510 Atherosclerotic heart disease of native coronary artery without angina pectoris: Secondary | ICD-10-CM | POA: Diagnosis not present

## 2020-09-03 ENCOUNTER — Other Ambulatory Visit: Payer: Self-pay | Admitting: Family Medicine

## 2020-09-03 NOTE — Telephone Encounter (Signed)
Requested Prescriptions  Pending Prescriptions Disp Refills  . carvedilol (COREG) 12.5 MG tablet [Pharmacy Med Name: CARVEDILOL (IR) TABS 12.5MG ] 180 tablet 3    Sig: TAKE 1 TABLET TWICE A DAY WITH MEALS     Cardiovascular:  Beta Blockers Passed - 09/03/2020  6:22 AM      Passed - Last BP in normal range    BP Readings from Last 1 Encounters:  05/08/20 116/64         Passed - Last Heart Rate in normal range    Pulse Readings from Last 1 Encounters:  05/08/20 65         Passed - Valid encounter within last 6 months    Recent Outpatient Visits          3 months ago Essential hypertension   [place], DO   10 months ago Annual physical exam   [place], DO   1 year ago Elevated hemoglobin A1c   [place], Devonne Doughty, DO   2 years ago Annual physical exam   [facility] Olin Hauser, DO   2 years ago Essential hypertension   [place], DO      Future Appointments            In 2 months [place], Devonne Doughty, [facility], [facility]

## 2020-09-21 ENCOUNTER — Ambulatory Visit (INDEPENDENT_AMBULATORY_CARE_PROVIDER_SITE_OTHER): Payer: BC Managed Care – PPO

## 2020-09-21 ENCOUNTER — Other Ambulatory Visit: Payer: Self-pay

## 2020-09-21 DIAGNOSIS — Z23 Encounter for immunization: Secondary | ICD-10-CM

## 2020-11-01 ENCOUNTER — Other Ambulatory Visit: Payer: Self-pay

## 2020-11-01 ENCOUNTER — Other Ambulatory Visit: Payer: BC Managed Care – PPO

## 2020-11-01 DIAGNOSIS — E7800 Pure hypercholesterolemia, unspecified: Secondary | ICD-10-CM | POA: Diagnosis not present

## 2020-11-01 DIAGNOSIS — E663 Overweight: Secondary | ICD-10-CM

## 2020-11-01 DIAGNOSIS — R351 Nocturia: Secondary | ICD-10-CM | POA: Diagnosis not present

## 2020-11-01 DIAGNOSIS — Z0000 Encounter for general adult medical examination without abnormal findings: Secondary | ICD-10-CM

## 2020-11-01 DIAGNOSIS — I10 Essential (primary) hypertension: Secondary | ICD-10-CM | POA: Diagnosis not present

## 2020-11-01 DIAGNOSIS — R7309 Other abnormal glucose: Secondary | ICD-10-CM

## 2020-11-01 DIAGNOSIS — I480 Paroxysmal atrial fibrillation: Secondary | ICD-10-CM

## 2020-11-02 LAB — COMPLETE METABOLIC PANEL WITH GFR
AG Ratio: 1.8 (calc) (ref 1.0–2.5)
ALT: 38 U/L (ref 9–46)
AST: 23 U/L (ref 10–35)
Albumin: 4.3 g/dL (ref 3.6–5.1)
Alkaline phosphatase (APISO): 51 U/L (ref 35–144)
BUN: 18 mg/dL (ref 7–25)
CO2: 26 mmol/L (ref 20–32)
Calcium: 9.6 mg/dL (ref 8.6–10.3)
Chloride: 105 mmol/L (ref 98–110)
Creat: 0.78 mg/dL (ref 0.70–1.33)
GFR, Est African American: 116 mL/min/{1.73_m2} (ref >=60)
GFR, Est Non African American: 100 mL/min/{1.73_m2} (ref >=60)
Globulin: 2.4 g/dL (calc) (ref 1.9–3.7)
Glucose, Bld: 110 mg/dL — ABNORMAL HIGH (ref 65–99)
Potassium: 4.4 mmol/L (ref 3.5–5.3)
Sodium: 141 mmol/L (ref 135–146)
Total Bilirubin: 0.4 mg/dL (ref 0.2–1.2)
Total Protein: 6.7 g/dL (ref 6.1–8.1)

## 2020-11-02 LAB — LIPID PANEL
Cholesterol: 242 mg/dL — ABNORMAL HIGH (ref <200)
HDL: 54 mg/dL (ref >=40)
LDL Cholesterol (Calc): 156 mg/dL (calc) — ABNORMAL HIGH
Non-HDL Cholesterol (Calc): 188 mg/dL (calc) — ABNORMAL HIGH (ref <130)
Total CHOL/HDL Ratio: 4.5 (calc) (ref <5.0)
Triglycerides: 180 mg/dL — ABNORMAL HIGH (ref <150)

## 2020-11-02 LAB — CBC WITH DIFFERENTIAL/PLATELET
Absolute Monocytes: 750 cells/uL (ref 200–950)
Basophils Absolute: 82 cells/uL (ref 0–200)
Basophils Relative: 1.3 %
Eosinophils Absolute: 321 cells/uL (ref 15–500)
Eosinophils Relative: 5.1 %
HCT: 48.2 % (ref 38.5–50.0)
Hemoglobin: 16.6 g/dL (ref 13.2–17.1)
Lymphs Abs: 1279 cells/uL (ref 850–3900)
MCH: 31.1 pg (ref 27.0–33.0)
MCHC: 34.4 g/dL (ref 32.0–36.0)
MCV: 90.3 fL (ref 80.0–100.0)
MPV: 11 fL (ref 7.5–12.5)
Monocytes Relative: 11.9 %
Neutro Abs: 3868 cells/uL (ref 1500–7800)
Neutrophils Relative %: 61.4 %
Platelets: 302 10*3/uL (ref 140–400)
RBC: 5.34 10*6/uL (ref 4.20–5.80)
RDW: 12.9 % (ref 11.0–15.0)
Total Lymphocyte: 20.3 %
WBC: 6.3 10*3/uL (ref 3.8–10.8)

## 2020-11-02 LAB — TSH: TSH: 1.18 mIU/L (ref 0.40–4.50)

## 2020-11-02 LAB — HEMOGLOBIN A1C
Hgb A1c MFr Bld: 5.4 % of total Hgb (ref <5.7)
Mean Plasma Glucose: 108 (calc)
eAG (mmol/L): 6 (calc)

## 2020-11-02 LAB — PSA: PSA: 0.97 ng/mL (ref <=4.0)

## 2020-11-08 ENCOUNTER — Encounter: Payer: Self-pay | Admitting: Family Medicine

## 2020-11-08 ENCOUNTER — Ambulatory Visit (INDEPENDENT_AMBULATORY_CARE_PROVIDER_SITE_OTHER): Payer: BC Managed Care – PPO | Admitting: Family Medicine

## 2020-11-08 ENCOUNTER — Other Ambulatory Visit: Payer: Self-pay

## 2020-11-08 VITALS — BP 137/71 | HR 63 | Temp 97.3°F | Resp 16 | Ht 69.0 in | Wt 244.0 lb

## 2020-11-08 DIAGNOSIS — I480 Paroxysmal atrial fibrillation: Secondary | ICD-10-CM | POA: Diagnosis not present

## 2020-11-08 DIAGNOSIS — J3089 Other allergic rhinitis: Secondary | ICD-10-CM

## 2020-11-08 DIAGNOSIS — R7309 Other abnormal glucose: Secondary | ICD-10-CM

## 2020-11-08 DIAGNOSIS — D126 Benign neoplasm of colon, unspecified: Secondary | ICD-10-CM

## 2020-11-08 DIAGNOSIS — E7800 Pure hypercholesterolemia, unspecified: Secondary | ICD-10-CM

## 2020-11-08 DIAGNOSIS — Z0000 Encounter for general adult medical examination without abnormal findings: Secondary | ICD-10-CM | POA: Diagnosis not present

## 2020-11-08 DIAGNOSIS — I10 Essential (primary) hypertension: Secondary | ICD-10-CM | POA: Diagnosis not present

## 2020-11-08 MED ORDER — ROSUVASTATIN CALCIUM 10 MG PO TABS: 10.0000 mg | ORAL_TABLET | Freq: Every day | ORAL | 3 refills | Status: DC

## 2020-11-08 MED ORDER — FLUTICASONE PROPIONATE 50 MCG/ACT NA SUSP: 2.0000 | Freq: Every day | NASAL | 3 refills | Status: DC

## 2020-11-08 NOTE — Assessment & Plan Note (Addendum)
BMI >36 with morbid obesity, co morbid HTN HLD Elevated A1c, PAFib. Encourage lifestyle diet exercise weight loss

## 2020-11-08 NOTE — Patient Instructions (Addendum)
Thank you for coming to the office today.  You are at increased risk of future Cardiovascular complications such as Heart Attack or Stroke from an artery blockage due to abnormal cholesterol and/or risk factors. - As discussed, Statin Cholesterol pills both can both LOWER cholesterol and REDUCE this future risk of heart attack and stroke - Start Rosuvastatin (generic Crestor) 10mg  pill once at bedtime every night  If you develop mild aches or pains in muscle or joint that does NOT improve or go away after first 3-4 weeks then this may require [place] to adjust the dose. First I would recommend STOPPING the medication for a few weeks until your ache and pain symptoms completely RESOLVE. Then you can restart at a LOWER DOSE either HALF a pill at bedtime every night or LESS OFTEN such as one pill a week only and then gradually increase to every other day or max dose of 3 times a week  Lastly, sometimes we need to try other versions of this medicine to find one that works for you and does not cause side effects.  ----  Start nasal steroid Flonase 2 sprays in each nostril daily for 4-6 weeks, may repeat course seasonally or as needed   Call Dr Vicente Males office for scheduling repeat Colonoscopy due 02/2021  [facility]) [address], [place] [address] Phone: [phone]  Recent Labs    11/01/20 0756  HGBA1C 5.4    DUE for FASTING BLOOD WORK (no food or drink after midnight before the lab appointment, only water or coffee without cream/sugar on the morning of)  SCHEDULE "Lab Only" visit in the morning at the clinic for lab draw in 3 MONTHS   - Make sure Lab Only appointment is at about 1 week before your next appointment, so that results will be available  For Lab Results, once available within 2-3 days of blood draw, you can can log in to MyChart online to view your results and a brief explanation. Also, we can discuss results at next follow-up  visit.   Please schedule a Follow-up Appointment to: Return in about 3 months (around 02/08/2021) for 3 month fasting lab then 1 week later Follow up cholesterol, lab results statin.  If you have any other questions or concerns, please feel free to call the office or send a message through [place]. You may also schedule an earlier appointment if necessary.  Additionally, you may be receiving a survey about your experience at our office within a few days to 1 week by e-mail or mail. We value your feedback.  Nobie Putnam, DO [place]

## 2020-11-08 NOTE — Assessment & Plan Note (Signed)
Uncontrolled cholesterol LDL >150 it had improved Last lipid panel 10/2020 The 10-year ASCVD risk score Mikey Bussing DC Jr., et al., 2013) is: 10%  Plan: 1. NEW START Statin - Rosuvastatin 10mg  nightly 90 day with refills - counseling on benefit risks side effect ASCVD reduction, dosing changes 2. Continue ASA 81mg  for primary ASCVD risk reduction - discuss with Cardiology Dr Clayborn Bigness may reduce or stop 3. Encourage improved lifestyle - low carb/cholesterol, reduce portion size, continue improving regular exercise  F/u lipid CMET in 3 months for statin

## 2020-11-08 NOTE — Progress Notes (Signed)
Subjective:    Patient ID: Vincent Clements, male    DOB: 02/03/1961, 59 y.o.   MRN: [id number]  Vincent Clements is a 59 y.o. male presenting on 11/08/2020 for No chief complaint on file.   HPI   Here for Annual Physical and Lab Review.  Mild Elevated A1c without dx Pre-Diabetes Last A1c 5.4. No new concerns. CBGs:Not checking Meds:never on med Reports good compliance. Tolerating well w/o side-effects Currentlynot on ACEi/ARB- off losartan Lifestyle: Weight gain - Diet (goal to increase water intake, goal to limit red meat and improve diet, inc vegetables - limits sugar - Exercise (active but limited exercise at times with knee, good days and bad days) Denies hypoglycemia  CHRONIC HTN: Improved BP overall.  Current Meds -Carvedilol 12.5mg  BID, Losartan 25mg  daily Reports good compliance, took meds today. Tolerating well, w/o complaints. Denies CP, dyspnea, HA, edema, dizziness / lightheadedness  HYPERLIPIDEMIA: - Reports no concerns. Last lipid panel 10/2020, LDL 170 down to 150s The 10-year ASCVD risk score Mikey Bussing DC Jr., et al., 2013) is: 10% Not on statin therapy Talked to Cardiology and they recommended it  Morbid Obesity BMI >36 Weight gain back to 240 lbs Admits goal to resume lifestyle changes - Diet (goalto increase water intake, avoiding red meat and improve diet, inc vegetables) - Exercise (active but limited exercise at times with knee, good days and bad days)  Atrial Fibrillation, Paroxysmal Followed by [facility] Cardiology Dr Clayborn Bigness, followed now yearly, continues med management, remains off ARB, they continued BB He does not feel episodes of afib Recently July 2021 ECHOcardiogram. He had Stress Test as well. Episodic chest pains vs heaviness, discussed with Dr Clayborn Bigness, he kept track of episodes, had some episodes in Aug/Sept then has resolved. He attributes it to indigestion and heart burn.  Health Maintenance:  Prostate CA Screening:No priorDRE.  Last PSA0.97 (10/2020). Currently asymptomaticexcept for mild nocturia seems improved after weight loss, but he drinks frequent water. DeniesBPH LUTS.No known family history of prostate CA.  Routine Hep C and HIV screening were negative.  Colon CA Screening: Last Colonoscopy 02/2018 (done byDr [name] [facility]), results withx 3 polyp tubular adenoma negative for malignancy or dysplasia by report, good for3years. Currently asymptomatic. No known family history of colon CA. Next due 02/2021   Depression screen [facility] 2/9 11/08/2020 05/08/2020 11/08/2019  Decreased Interest 0 0 0  Down, Depressed, Hopeless 0 0 0  PHQ - 2 Score 0 0 0    Past Medical History:  Diagnosis Date  . Arthritis   . Hypertension    Past Surgical History:  Procedure Laterality Date  . CHOLECYSTECTOMY  10/06/2006  . COLONOSCOPY WITH PROPOFOL N/A 03/17/2018   Procedure: COLONOSCOPY WITH PROPOFOL;  Surgeon: [name], MD;  Location: [facility] ENDOSCOPY;  Service: Gastroenterology;  Laterality: N/A;  . KNEE ARTHROSCOPY Right 02/27/2017   Procedure: right knee arthroscopy, partial medial and lateral menisectomy;  Surgeon: Hessie Knows, MD;  Location: [facility] ORS;  Service: Orthopedics;  Laterality: Right;   Social History   Socioeconomic History  . Marital status: Married    Spouse name: Not on file  . Number of children: Not on file  . Years of education: [employer]  . Highest education level: Not on file  Occupational History  . Occupation: Animal nutritionist  Tobacco Use  . Smoking status: Never Smoker  . Smokeless tobacco: Former Systems developer    Types: Chew, Snuff  . Tobacco comment: pt quit chewing tobacco 01/11/2006  Substance and Sexual Activity  .  Alcohol use: Yes    Comment: occasionally, 1 per week  . Drug use: No  . Sexual activity: Not on file  Other Topics Concern  . Not on file  Social History Narrative  . Not on file   Social Determinants of Health   Financial Resource Strain:   . Difficulty of  Paying Living Expenses: Not on file  Food Insecurity:   . Worried About Charity fundraiser in the Last Year: Not on file  . Ran Out of Food in the Last Year: Not on file  Transportation Needs:   . Lack of Transportation (Medical): Not on file  . Lack of Transportation (Non-Medical): Not on file  Physical Activity:   . Days of Exercise per Week: Not on file  . Minutes of Exercise per Session: Not on file  Stress:   . Feeling of Stress : Not on file  Social Connections:   . Frequency of Communication with Friends and Family: Not on file  . Frequency of Social Gatherings with Friends and Family: Not on file  . Attends Religious Services: Not on file  . Active Member of Clubs or Organizations: Not on file  . Attends Archivist Meetings: Not on file  . Marital Status: Not on file  Intimate Partner Violence:   . Fear of Current or Ex-Partner: Not on file  . Emotionally Abused: Not on file  . Physically Abused: Not on file  . Sexually Abused: Not on file   Family History  Problem Relation Age of Onset  . Heart disease Mother   . Heart failure Mother   . Stroke Maternal Grandfather    Current Outpatient Medications on File Prior to Visit  Medication Sig  . aspirin EC 81 MG tablet Take 81 mg by mouth at bedtime.   . carvedilol (COREG) 12.5 MG tablet TAKE 1 TABLET TWICE A DAY WITH MEALS  . losartan (COZAAR) 25 MG tablet Take 25 mg by mouth daily.  . Multiple Vitamins-Minerals (CENTRUM SILVER 50+MEN PO) Take by mouth.  . sildenafil (REVATIO) 20 MG tablet Take 1-5 pills about 30 min prior to sex. Start with 1 and increase as needed.   No current facility-administered medications on file prior to visit.    Review of Systems  Constitutional: Negative for activity change, appetite change, chills, diaphoresis, fatigue and fever.  HENT: Negative for congestion and hearing loss.   Eyes: Negative for visual disturbance.  Respiratory: Negative for apnea, cough, chest tightness,  shortness of breath and wheezing.   Cardiovascular: Negative for chest pain, palpitations and leg swelling.  Gastrointestinal: Negative for abdominal pain, constipation, diarrhea, nausea and vomiting.  Endocrine: Negative for cold intolerance.  Genitourinary: Negative for difficulty urinating, dysuria, frequency and hematuria.  Musculoskeletal: Negative for arthralgias and neck pain.  Skin: Negative for rash.  Allergic/Immunologic: Positive for environmental allergies.  Neurological: Negative for dizziness, weakness, light-headedness, numbness and headaches.  Hematological: Negative for adenopathy.  Psychiatric/Behavioral: Negative for behavioral problems, dysphoric mood and sleep disturbance. The patient is not nervous/anxious.    Per HPI unless specifically indicated above      Objective:    BP 137/71   Pulse 63   Temp (!) 97.3 F (36.3 C) (Temporal)   Resp 16   Ht 5\' 9"  (1.753 m)   Wt 244 lb (110.7 kg)   SpO2 98%   BMI 36.03 kg/m   Wt Readings from Last 3 Encounters:  11/08/20 244 lb (110.7 kg)  05/08/20 187 lb (84.8 kg)  11/08/19 234 lb 8 oz (106.4 kg)    Physical Exam Vitals and nursing note reviewed.  Constitutional:      General: He is not in acute distress.    Appearance: He is well-developed. He is not diaphoretic.     Comments: Well-appearing, comfortable, cooperative  HENT:     Head: Normocephalic and atraumatic.     Right Ear: Tympanic membrane, ear canal and external ear normal. There is no impacted cerumen.     Left Ear: Tympanic membrane, ear canal and external ear normal. There is no impacted cerumen.  Eyes:     General:        Right eye: No discharge.        Left eye: No discharge.     Conjunctiva/sclera: Conjunctivae normal.     Pupils: Pupils are equal, round, and reactive to light.  Neck:     Thyroid: No thyromegaly.     Vascular: No carotid bruit.  Cardiovascular:     Rate and Rhythm: Normal rate and regular rhythm.     Heart sounds: Normal  heart sounds. No murmur heard.   Pulmonary:     Effort: Pulmonary effort is normal. No respiratory distress.     Breath sounds: Normal breath sounds. No wheezing or rales.  Abdominal:     General: Bowel sounds are normal. There is no distension.     Palpations: Abdomen is soft. There is no mass.     Tenderness: There is no abdominal tenderness.  Musculoskeletal:        General: No tenderness. Normal range of motion.     Cervical back: Normal range of motion and neck supple.     Right lower leg: No edema.     Left lower leg: No edema.     Comments: Upper / Lower Extremities: - Normal muscle tone, strength bilateral upper extremities 5/5, lower extremities 5/5  Lymphadenopathy:     Cervical: No cervical adenopathy.  Skin:    General: Skin is warm and dry.     Findings: No erythema or rash.  Neurological:     Mental Status: He is alert and oriented to person, place, and time.     Comments: Distal sensation intact to light touch all extremities  Psychiatric:        Behavior: Behavior normal.     Comments: Well groomed, good eye contact, normal speech and thoughts      NM myocardial perfusion SPECT multiple (stress and rest)  Impression  Mildly abnormal myocardial perfusion scan area of inferior  defect with possible redistribution equivocal for ischemia overall  ejection fraction of 51% conclusion borderline abnormal myocardial  perfusion scan consider further evaluation in symptomsor clinical picture  suggests invasive strategy Narrative  [place]  [place], [place], [place] [address]  [phone]   Procedure: Pharmacologic Myocardial Perfusion Imaging   ONE day procedure   Indication: Essential hypertension  Plan: NM myocardial perfusion SPECT multiple (stress    and rest), ECG stress test only   Ordering Physician:   Dr. Lujean Amel    Clinical History:  59 y.o. year old male recent  anginal type symptoms  Vitals: Height: 70 in Weight: 199 lb  Cardiac risk factors include:    Smoking, HTN and Obesity    Procedure:   Pharmacologic stress testing was performed with Regadenoson using a single  use 0.4mg /58ml (0.08 mg/ml) prefilled syringe intravenously infused as a  bolus dose over 10-15  seconds. Thestress test was stopped due to Infusion  completion. Blood pressure response was normal. The patient did not  develop any symptoms other than fatigue during the procedure.   Rest HR: 67bpm  Rest BP: mmHg  Max HR: 96bpm  Min BP: 120/70mmHg   StressTest Administered by: Everitt Amber CMA   ECG Interpretation:  Rest ECG: normal sinus rhythm, none  Stress ECG: normal sinus rhythm,  Recovery ECG: normal sinus rhythm  ECG Interpretation: non-diagnostic due to pharmacologic testing.    Administrations This Visit   regadenoson (LEXISCAN) 0.4 mg/5 mL inj syringe 0.4 mg   Admin Date  08/10/2020 Action  Given Dose  0.4 mg Route  Intravenous Administered By  Herbert Seta, CNMT    technetium Tc44m sestamibi (CARDIOLITE) injection 35.57 millicurie   Admin Date  08/10/2020 Action  Given Dose  32.20 millicurie Route  Intravenous Administered By  Herbert Seta, CNMT    technetium Tc53m sestamibi (CARDIOLITE) injection 25.42 millicurie   Admin Date  08/10/2020 Action  Given Dose  70.62 millicurie Route  Intravenous Administered By  Herbert Seta, CNMT       Gated post-stress perfusion imaging was performed 30 minutes after stress.  Rest images were performed 30 minutes after injection.   Gated LV Analysis:   Summary of LV Perfusion: Equivocal,   Summary of LV Function: Equivocal    TID Ratio: 1.13   LVEF= 51%   FINDINGS:  Regional wall motion: reveals normal myocardial thickening and wall  motion.  The overall quality of the study is good.   Artifacts noted: no  Left ventricular cavity: normal.   Perfusion Analysis:  SPECT images demonstrate moderate perfusion  abnormality of moderate intensity is present in the inferior region on the  stress images. Defect type : Mixed  Specimen Collected: -- Last Resulted: 08/17/20 9:55 AM  Received From: [place]  Result Received: 09/21/20 8:55 AM   ------------------  CARDIOLOGY DEPARTMENT         Jeral, Zick Dwain      [place]                  [id number]      [place] #: [id number]      [place], [place], [place] [address]    Date: 06/28/2020 08: 23 AM                                Adult  Male  Age: 32 yrs      ECHOCARDIOGRAM REPORT               Outpatient                               [facility]    STUDY:CHEST WALL        TAPE:          MD1: WHITE, DELICIA MARIA    ECHO:Yes  DOPPLER:Yes    FILE:          BP: 120/78 mmHg    COLOR:Yes  CONTRAST:Yes  MACHINE:Philips  RV BIOPSY:No     3D:No SOUND QLTY:Moderate      Height: 70 in   MEDIUM:None  Weight: 18 lb                                BSA: 0.75 m2  _________________________________________________________________________________________        HISTORY: CHF         REASON: Assess, LV function       INDICATION: I51.9 Heart disease, unspecified  _________________________________________________________________________________________  ECHOCARDIOGRAPHIC MEASUREMENTS  2D DIMENSIONS  AORTA         Values  Normal Range  MAIN PA     Values  Normal Range        Annulus: nm*     [2.3-2.9]     PA Main: nm*    [1.5-2.1]       Aorta Sin: 3.7 cm    [3.1-3.7]  RIGHT VENTRICLE      ST Junction: nm*      [2.6-3.2]     RV Base: 4.5 cm  [< 4.2]       Asc.Aorta: nm*     [2.6-3.4]     RV Mid: nm*    [<3.5]  LEFT VENTRICLE                  RV Length: nm*    [<8.6]         LVIDd: 6.0 cm    [4.2-5.9]  INFERIOR VENA CAVA         LVIDs: 4.8 cm            Max. IVC: nm*    [<=2.1]           FS: 21.2 %    [>25]      Min. IVC: nm*          SWT: 1.1 cm    [0.6-1.0]  ------------------          PWT: 0.81 cm   [0.6-1.0]  nm* - not measured  LEFT ATRIUM        LA Diam: 4.4 cm    [3.0-4.0]      LA A4C Area: nm*     [<20]       LA Volume: nm*     [18-58]  _________________________________________________________________________________________  ECHOCARDIOGRAPHIC DESCRIPTIONS  AORTIC ROOT          Size: Normal       Dissection: INDETERM FOR DISSECTION  AORTIC VALVE        Leaflets: Tricuspid          Morphology: Normal        Mobility: Fully mobile  LEFT VENTRICLE          Size: MILDLY ENLARGED        Anterior: HYPOCONTRACTILE      Contraction: MILDGLOBAL DECREASE      Lateral: HYPOCONTRACTILE       Closest EF: 45% (Estimated)         Septal: HYPOCONTRACTILE       LV Masses: No Masses            Apical: HYPOCONTRACTILE          LVH: None            Inferior: HYPOCONTRACTILE                           Posterior: HYPOCONTRACTILE      Dias.FxClass: (Grade 1) relaxation abnormal, E/A reversal  MITRAL VALVE        Leaflets: Normal  Mobility: Fully mobile       Morphology: Normal  LEFT ATRIUM          Size: MILDLY ENLARGED       LA Masses: No masses       IA Septum: Normal IAS  MAIN PA          Size: Normal  PULMONIC VALVE         Leaflets: N/A             Morphology: Normal        Mobility: Fully mobile  RIGHT VENTRICLE          Size: MILDLY ENLARGED       Free Wall: Normal      Contraction: Normal            RV Masses: No Masses  TRICUSPID VALVE        Leaflets: Normal            Mobility: Fully mobile       Morphology: Normal  RIGHT ATRIUM          Size: MILDLY ENLARGED        RA Other: None        RA Mass: No masses  PERICARDIUM        Fluid: No effusion  INFERIOR VENACAVA          Size: Not seen Not Seen  _________________________________________________________________________________________   DOPPLER ECHO and OTHER SPECIAL PROCEDURES         Aortic: TRIVIAL AR         No AS             99.3 cm/sec peak vel    3.9 mmHg peak grad             2.0 mmHg mean grad     3.7 cm^2 by DOPPLER         Mitral: MILD MR          No MS             MV Inflow E Vel = 53.3 cm/sec   MV Annulus E'Vel = nm*             E/E'Ratio = nm*       Tricuspid: TRIVIAL TR         No TS             198.0 cm/sec peak TR vel  18.7 mmHg peak RV pressure       Pulmonary: TRIVIAL PR         No PS  _________________________________________________________________________________________  INTERPRETATION  MILD LV SYSTOLIC DYSFUNCTION WITH AN ESTIMATED EF = 40-45 %  NORMAL RIGHT VENTRICULAR SYSTOLIC FUNCTION  MILD MITRAL VALVE INSUFFICIENCY  TRACE TRICUSPID AND AORTIC VALVE INSUFFICIENCY  NO VALVULAR STENOSIS  MILD LV ENLARGEMENT  MILD RV ENLARGEMENT  MILD BIATRIAL ENLARGEMENT  _________________________________________________________________________________________  Electronically signed by  Lujean Amel, MD on 06/28/2020 09: 40 AM      Performed By: Cephus Shelling, RVT   Ordering Physician: Lujean Amel, MD  _________________________________________________________________________________________ Procedure Note  Interface, Text Results In - 06/28/2020 9:41 AM EDT  Formatting of this note might be different from the original.             [place], [place]  [id number]       [place] #: [id number]       [place], [place], [place] [address]    Date: 06/28/2020 08: 29 AM                                Adult  Male  Age: 54 yrs       ECHOCARDIOGRAM REPORT               Outpatient                                [facility]    STUDY:CHEST WALL        TAPE:          MD1: WHITE, DELICIA MARIA     ECHO:Yes  DOPPLER:Yes    FILE:          BP: 120/78 mmHg    COLOR:Yes  CONTRAST:Yes  MACHINE:Philips  RV BIOPSY:No     3D:No SOUND QLTY:Moderate      Height: 70 in    MEDIUM:None                       Weight: 18 lb                                BSA: 0.75 m2  _________________________________________________________________________________________         HISTORY: CHF         REASON: Assess, LV function       INDICATION: I51.9 Heart disease, unspecified  _________________________________________________________________________________________  ECHOCARDIOGRAPHIC MEASUREMENTS  2D DIMENSIONS  AORTA         Values  Normal Range  MAIN PA    Values  Normal Range         Annulus: nm*     [2.3-2.9]     PA Main: nm*    [1.5-2.1]        Aorta Sin: 3.7 cm    [3.1-3.7]  RIGHT VENTRICLE       ST Junction: nm*     [2.6-3.2]      RV Base: 4.5 cm  [< 4.2]        Asc.Aorta: nm*     [2.6-3.4]     RV Mid: nm*    [<3.5]  LEFT VENTRICLE                   RV Length: nm*    [<8.6]          LVIDd: 6.0 cm    [4.2-5.9]  INFERIOR VENA CAVA          LVIDs: 4.8 cm            Max. IVC: nm*    [<=2.1]           FS: 21.2 %    [>25]      Min. IVC: nm*           SWT: 1.1 cm    [0.6-1.0]  ------------------           PWT: 0.81 cm   [0.6-1.0]  nm*- not measured  LEFT ATRIUM         LA Diam: 4.4 cm    [  3.0-4.0]       LA A4C Area: nm*     [<20]        LA Volume: nm*     [18-58]  _________________________________________________________________________________________  ECHOCARDIOGRAPHIC DESCRIPTIONS  AORTIC ROOT          Size: Normal       Dissection: INDETERM FOR DISSECTION  AORTIC VALVE        Leaflets: Tricuspid          Morphology: Normal        Mobility: Fully mobile  LEFT VENTRICLE          Size: MILDLY ENLARGED        Anterior: HYPOCONTRACTILE       Contraction: MILD GLOBAL DECREASE      Lateral: HYPOCONTRACTILE       Closest EF: 45% (Estimated)         Septal: HYPOCONTRACTILE       LV Masses: No Masses            Apical: HYPOCONTRACTILE           LVH: None             Inferior: HYPOCONTRACTILE                            Posterior: HYPOCONTRACTILE      Dias.FxClass: (Grade 1) relaxation abnormal, E/A reversal  MITRAL VALVE        Leaflets: Normal            Mobility: Fully mobile       Morphology: Normal  LEFT ATRIUM          Size: MILDLY ENLARGED       LA Masses: No masses        IA Septum: Normal IAS  MAIN PA          Size: Normal  PULMONIC VALVE         Leaflets: N/A             Morphology: Normal        Mobility: Fully mobile  RIGHT VENTRICLE          Size: MILDLY ENLARGED       Free Wall: Normal       Contraction: Normal            RV Masses: No Masses  TRICUSPID VALVE        Leaflets: Normal            Mobility: Fully mobile       Morphology: Normal  RIGHT ATRIUM         Size: MILDLY ENLARGED        RA Other: None         RA Mass: No masses  PERICARDIUM          Fluid: No effusion  INFERIOR VENACAVA          Size: Not seen Not Seen  _________________________________________________________________________________________   DOPPLER ECHO and OTHER SPECIAL PROCEDURES         Aortic: TRIVIAL AR         No AS             99.3 cm/sec peak vel    3.9 mmHg peak grad             2.0 mmHg mean grad     3.7 cm^2 by DOPPLER         Mitral:  MILD MR          No MS             MV Inflow E Vel = 53.3 cm/sec   MV Annulus E'Vel = nm*             E/E'Ratio = nm*        Tricuspid: TRIVIAL TR        No TS             198.0 cm/sec peak TR vel  18.7 mmHg peak RV pressure        Pulmonary: TRIVIAL PR         No PS  _________________________________________________________________________________________  INTERPRETATION  MILD LV SYSTOLIC DYSFUNCTION WITH AN ESTIMATED EF = 40-45 %  NORMAL RIGHT VENTRICULAR SYSTOLIC FUNCTION  MILD MITRAL VALVE INSUFFICIENCY  TRACE TRICUSPID AND AORTIC VALVE INSUFFICIENCY  NO VALVULAR STENOSIS  MILD LV ENLARGEMENT  MILD RV ENLARGEMENT  MILD BIATRIAL ENLARGEMENT  _________________________________________________________________________________________  Electronically signed by  Lujean Amel, MD on 06/28/2020 09: 40 AM      Performed By: Johnathan Hausen,  RDCS, RVT   Ordering Physician: Lujean Amel, MD  _________________________________________________________________________________________ All Measurements                                                                                                              Specimen Collected: 06/28/20 8:59 AM Last Resulted: 06/28/20 9:40 AM  Received From: [place]  Result Received: 09/21/20 8:55 AM     Results for orders placed or performed in visit on 11/01/20  TSH  Result Value Ref Range   TSH 1.18 0.40 - 4.50 mIU/L  PSA  Result Value Ref Range   PSA 0.97 < OR = 4.0 ng/mL  Lipid panel  Result Value Ref Range   Cholesterol 242 (H) <200 mg/dL   HDL 54 > OR = 40 mg/dL   Triglycerides 180 (H) <150 mg/dL   LDL Cholesterol (Calc) 156 (H) mg/dL (calc)   Total CHOL/HDL Ratio 4.5 <5.0 (calc)   Non-HDL Cholesterol (Calc) 188 (H) <130 mg/dL (calc)  COMPLETE METABOLIC PANEL WITH GFR  Result Value Ref Range   Glucose, Bld 110 (H) 65 - 99 mg/dL   BUN 18 7 - 25 mg/dL   Creat 0.78 0.70 - 1.33 mg/dL   GFR, Est Non African American 100 > OR = 60 mL/min/1.22m2   GFR, Est African American 116 > OR = 60 mL/min/1.46m2   BUN/Creatinine Ratio NOT APPLICABLE 6 - 22 (calc)   Sodium 141 135 - 146 mmol/L   Potassium 4.4 3.5 - 5.3 mmol/L   Chloride 105 98 - 110 mmol/L   CO2 26 20 - 32 mmol/L   Calcium 9.6 8.6 - 10.3 mg/dL   Total Protein 6.7 6.1 - 8.1 g/dL   Albumin 4.3 3.6 - 5.1 g/dL   Globulin 2.4 1.9 - 3.7 g/dL (calc)   AG Ratio 1.8 1.0 - 2.5 (calc)   Total Bilirubin 0.4 0.2 - 1.2 mg/dL   Alkaline phosphatase (APISO)  51 35 - 144 U/L   AST 23 10 - 35 U/L   ALT 38 9 - 46 U/L  CBC with Differential/Platelet  Result Value Ref Range   WBC 6.3 3.8 - 10.8 Thousand/uL   RBC 5.34 4.20 - 5.80 Million/uL   Hemoglobin 16.6 13.2 - 17.1 g/dL   HCT 48.2 38 - 50 %   MCV 90.3 80.0 - 100.0 fL   MCH 31.1 27.0 - 33.0 pg   MCHC 34.4 32.0 - 36.0 g/dL    RDW 12.9 11.0 - 15.0 %   Platelets 302 140 - 400 Thousand/uL   MPV 11.0 7.5 - 12.5 fL   Neutro Abs 3,868 1,500 - 7,800 cells/uL   Lymphs Abs 1,279 850 - 3,900 cells/uL   Absolute Monocytes 750 200 - 950 cells/uL   Eosinophils Absolute 321 15.0 - 500.0 cells/uL   Basophils Absolute 82 0.0 - 200.0 cells/uL   Neutrophils Relative % 61.4 %   Total Lymphocyte 20.3 %   Monocytes Relative 11.9 %   Eosinophils Relative 5.1 %   Basophils Relative 1.3 %  Hemoglobin A1c  Result Value Ref Range   Hgb A1c MFr Bld 5.4 <5.7 % of total Hgb   Mean Plasma Glucose 108 (calc)   eAG (mmol/L) 6.0 (calc)      Assessment & Plan:   Problem List Items Addressed This Visit    Pure hypercholesterolemia    Uncontrolled cholesterol LDL >150 it had improved Last lipid panel 10/2020 The 10-year ASCVD risk score Mikey Bussing DC Jr., et al., 2013) is: 10%  Plan: 1. NEW START Statin - Rosuvastatin 10mg  nightly 90 day with refills - counseling on benefit risks side effect ASCVD reduction, dosing changes 2. Continue ASA 81mg  for primary ASCVD risk reduction - discuss with Cardiology Dr Clayborn Bigness may reduce or stop 3. Encourage improved lifestyle - low carb/cholesterol, reduce portion size, continue improving regular exercise  F/u lipid CMET in 3 months for statin      Relevant Medications   losartan (COZAAR) 25 MG tablet   rosuvastatin (CRESTOR) 10 MG tablet   Paroxysmal atrial fibrillation (HCC)    Stable, without active AFib Followed by Dr Clayborn Bigness [place] [facility]      Relevant Medications   losartan (COZAAR) 25 MG tablet   rosuvastatin (CRESTOR) 10 MG tablet   Morbid obesity (HCC)    BMI >36 with morbid obesity, co morbid HTN HLD Elevated A1c, PAFib. Encourage lifestyle diet exercise weight loss      Essential hypertension    Controlled BP Normal readings outside office OFF ACEi (cough) Complication with Systolic CHF NICM, AFib Followed by [facility] Cardiology Dr Clayborn Bigness    Plan:  1. Continue Losartan 25mg  daily  back on ARB, Carvedilol 12.5mg  BID - Cardiology may consider dose reduction in future 2. Encourage improved lifestyle - low sodium diet, regular exercise - keep up maintaining weight loss 3. Continue monitor BP outside office, bring readings to next visit, if persistently >140/90 or new symptoms notify office sooner      Relevant Medications   losartan (COZAAR) 25 MG tablet   rosuvastatin (CRESTOR) 10 MG tablet   Elevated hemoglobin A1c    Controlled A1c 5.4 Controlled HTN.  Plan:  1. Not on any therapy currently  2. Encourage improved lifestyle - low carb, low sugar diet, reduce portion size, continue improving regular exercise  Check yearly      Colon polyps   Allergic rhinitis due to allergen   Relevant Medications   fluticasone (FLONASE)  50 MCG/ACT nasal spray    Other Visit Diagnoses    Annual physical exam    -  Primary      Updated Health Maintenance information - UTD COVID Flu vaccines - Upcoming due repeat Colonoscopy Dr Vicente Males AGI - 02/2021 - he will call to schedule Reviewed recent lab results with patient Encouraged improvement to lifestyle with diet and exercise - Goal of weight loss  #Allergies, rhinitis Chronic post nasal drainage throat clear, will retry Flonase  Meds ordered this encounter  Medications  . rosuvastatin (CRESTOR) 10 MG tablet    Sig: Take 1 tablet (10 mg total) by mouth at bedtime.    Dispense:  90 tablet    Refill:  3  . fluticasone (FLONASE) 50 MCG/ACT nasal spray    Sig: Place 2 sprays into both nostrils daily. Use for 4-6 weeks then stop and use seasonally or as needed.    Dispense:  16 g    Refill:  3      Follow up plan: Return in about 3 months (around 02/08/2021) for 3 month fasting lab then 1 week later Follow up cholesterol, lab results statin.  Nobie Putnam, DO [facility] 11/08/2020, 8:17 AM

## 2020-11-08 NOTE — Assessment & Plan Note (Signed)
Stable, without active AFib Followed by Dr Callwood On BB 

## 2020-11-08 NOTE — Assessment & Plan Note (Addendum)
Controlled A1c 5.4 Controlled HTN.  Plan:  1. Not on any therapy currently  2. Encourage improved lifestyle - low carb, low sugar diet, reduce portion size, continue improving regular exercise  Check yearly

## 2020-11-08 NOTE — Assessment & Plan Note (Signed)
Controlled BP Normal readings outside office OFF ACEi (cough) Complication with Systolic CHF NICM, AFib Followed by [facility] Cardiology Dr Clayborn Bigness    Plan:  1. Continue Losartan 25mg  daily back on ARB, Carvedilol 12.5mg  BID - Cardiology may consider dose reduction in future 2. Encourage improved lifestyle - low sodium diet, regular exercise - keep up maintaining weight loss 3. Continue monitor BP outside office, bring readings to next visit, if persistently >140/90 or new symptoms notify office sooner

## 2020-12-05 ENCOUNTER — Other Ambulatory Visit: Payer: Self-pay | Admitting: Family Medicine

## 2020-12-05 DIAGNOSIS — E7800 Pure hypercholesterolemia, unspecified: Secondary | ICD-10-CM

## 2020-12-06 MED ORDER — ROSUVASTATIN CALCIUM 10 MG PO TABS: 10.0000 mg | ORAL_TABLET | Freq: Every day | ORAL | 3 refills | Status: DC

## 2021-01-03 DIAGNOSIS — I10 Essential (primary) hypertension: Secondary | ICD-10-CM | POA: Diagnosis not present

## 2021-01-03 DIAGNOSIS — Z8639 Personal history of other endocrine, nutritional and metabolic disease: Secondary | ICD-10-CM | POA: Diagnosis not present

## 2021-01-03 DIAGNOSIS — G4733 Obstructive sleep apnea (adult) (pediatric): Secondary | ICD-10-CM | POA: Diagnosis not present

## 2021-01-03 DIAGNOSIS — R42 Dizziness and giddiness: Secondary | ICD-10-CM | POA: Diagnosis not present

## 2021-01-24 DIAGNOSIS — G4733 Obstructive sleep apnea (adult) (pediatric): Secondary | ICD-10-CM | POA: Diagnosis not present

## 2021-01-24 DIAGNOSIS — J310 Chronic rhinitis: Secondary | ICD-10-CM | POA: Diagnosis not present

## 2021-01-31 ENCOUNTER — Other Ambulatory Visit: Payer: Self-pay

## 2021-01-31 ENCOUNTER — Telehealth (INDEPENDENT_AMBULATORY_CARE_PROVIDER_SITE_OTHER): Payer: Self-pay | Admitting: Gastroenterology

## 2021-01-31 ENCOUNTER — Telehealth: Payer: Self-pay

## 2021-01-31 DIAGNOSIS — Z86010 Personal history of colonic polyps: Secondary | ICD-10-CM

## 2021-01-31 MED ORDER — PEG 3350-KCL-NA BICARB-NACL 420 G PO SOLR: 4000.0000 mL | Freq: Once | ORAL | 0 refills | Status: AC

## 2021-01-31 NOTE — Progress Notes (Signed)
Gastroenterology Pre-Procedure Review  Request Date: 03/20/21 Requesting Physician: Dr. Vicente Males  PATIENT REVIEW QUESTIONS: The patient responded to the following health history questions as indicated:    1. Are you having any GI issues? no 2. Do you have a personal history of Polyps? yes (03/20/21 performed by dr. Vicente Males) 3. Do you have a family history of Colon Cancer or Polyps? no 4. Diabetes Mellitus? no 5. Joint replacements in the past 12 months?no 6. Major health problems in the past 3 months?no 7. Any artificial heart valves, MVP, or defibrillator?no    MEDICATIONS & ALLERGIES:    Patient reports the following regarding taking any anticoagulation/antiplatelet therapy:   Plavix, Coumadin, Eliquis, Xarelto, Lovenox, Pradaxa, Brilinta, or Effient? no Aspirin? Yes 810 mg daily  Patient confirms/reports the following medications:  Current Outpatient Medications  Medication Sig Dispense Refill  . aspirin EC 81 MG tablet Take 81 mg by mouth at bedtime.     . carvedilol (COREG) 12.5 MG tablet TAKE 1 TABLET TWICE A DAY WITH MEALS 180 tablet 3  . fluticasone (FLONASE) 50 MCG/ACT nasal spray Place 2 sprays into both nostrils daily. Use for 4-6 weeks then stop and use seasonally or as needed. 16 g 3  . losartan (COZAAR) 25 MG tablet Take 25 mg by mouth daily.    . Multiple Vitamins-Minerals (CENTRUM SILVER 50+MEN PO) Take by mouth.    . polyethylene glycol-electrolytes (NULYTELY) 420 g solution Take 4,000 mLs by mouth once for 1 dose. 4000 mL 0  . rosuvastatin (CRESTOR) 10 MG tablet Take 1 tablet (10 mg total) by mouth at bedtime. 90 tablet 3   No current facility-administered medications for this visit.    Patient confirms/reports the following allergies:  Allergies  Allergen Reactions  . Lisinopril Cough    Orders Placed This Encounter  Procedures  . Procedural/ Surgical Case Request: COLONOSCOPY WITH PROPOFOL    Standing Status:   Standing    Number of Occurrences:   1    Order  Specific Question:   Pre-op diagnosis    Answer:   personal history of colon polyps    Order Specific Question:   CPT Code    Answer:   96759  . Ambulatory referral to Gastroenterology    Referral Priority:   Routine    Referral Type:   Consultation    Referral Reason:   Specialty Services Required    Referred to Provider:   [name], MD    Number of Visits Requested:   1    AUTHORIZATION INFORMATION Primary Insurance: 1D#: Group #:  Secondary Insurance: 1D#: Group #:  SCHEDULE INFORMATION: Date: 03/20/21 Time: Location:[facility]

## 2021-01-31 NOTE — Telephone Encounter (Signed)
Opened in error.  Thanks,  Donnica Jarnagin, CMA 

## 2021-02-01 ENCOUNTER — Other Ambulatory Visit: Payer: Self-pay

## 2021-02-01 ENCOUNTER — Other Ambulatory Visit: Payer: BC Managed Care – PPO

## 2021-02-01 DIAGNOSIS — E7800 Pure hypercholesterolemia, unspecified: Secondary | ICD-10-CM | POA: Diagnosis not present

## 2021-02-02 LAB — LIPID PANEL
Cholesterol: 157 mg/dL (ref <200)
HDL: 37 mg/dL — ABNORMAL LOW (ref >=40)
LDL Cholesterol (Calc): 88 mg/dL (calc)
Non-HDL Cholesterol (Calc): 120 mg/dL (calc) (ref <130)
Total CHOL/HDL Ratio: 4.2 (calc) (ref <5.0)
Triglycerides: 227 mg/dL — ABNORMAL HIGH (ref <150)

## 2021-02-08 ENCOUNTER — Ambulatory Visit (INDEPENDENT_AMBULATORY_CARE_PROVIDER_SITE_OTHER): Payer: BC Managed Care – PPO | Admitting: Family Medicine

## 2021-02-08 ENCOUNTER — Other Ambulatory Visit: Payer: Self-pay

## 2021-02-08 ENCOUNTER — Encounter: Payer: Self-pay | Admitting: Family Medicine

## 2021-02-08 ENCOUNTER — Other Ambulatory Visit: Payer: Self-pay | Admitting: Family Medicine

## 2021-02-08 VITALS — BP 120/79 | HR 61 | Temp 96.9°F | Ht 70.0 in | Wt 239.8 lb

## 2021-02-08 DIAGNOSIS — Z0000 Encounter for general adult medical examination without abnormal findings: Secondary | ICD-10-CM

## 2021-02-08 DIAGNOSIS — E7800 Pure hypercholesterolemia, unspecified: Secondary | ICD-10-CM

## 2021-02-08 DIAGNOSIS — R7309 Other abnormal glucose: Secondary | ICD-10-CM

## 2021-02-08 DIAGNOSIS — I480 Paroxysmal atrial fibrillation: Secondary | ICD-10-CM

## 2021-02-08 DIAGNOSIS — R351 Nocturia: Secondary | ICD-10-CM

## 2021-02-08 DIAGNOSIS — I10 Essential (primary) hypertension: Secondary | ICD-10-CM

## 2021-02-08 NOTE — Progress Notes (Signed)
Subjective:    Patient ID: Vincent Clements, male    DOB: January 31, 1961, 60 y.o.   MRN: [id number]  Vincent Clements is a 60 y.o. male presenting on 02/08/2021 for Follow-up and Hyperlipidemia   HPI   HYPERLIPIDEMIA: - Reports no concerns. Last lipid panel 01/2021 improved LDL from 150s down to 88, mild elevated TG >220 Currently on Rosuvastatin 10mg  nightly, tolerating well without muscle ache myalgia Followed by Cardiology He admits occasional mild headaches for few weeks - these have now resolved  Health Maintenance: Upcoming Colonoscopy 03/20/21 scheduled.  Upcoming Sleep Study (in home) for OSA test - 02/2021  Depression screen [facility] 2/9 11/08/2020 05/08/2020 11/08/2019  Decreased Interest 0 0 0  Down, Depressed, Hopeless 0 0 0  PHQ - 2 Score 0 0 0    Social History   Tobacco Use  . Smoking status: Never Smoker  . Smokeless tobacco: Former Systems developer    Types: Chew, Snuff  . Tobacco comment: pt quit chewing tobacco 01/11/2006  Substance Use Topics  . Alcohol use: Yes    Comment: occasionally, 1 per week  . Drug use: No    Review of Systems Per HPI unless specifically indicated above     Objective:    BP 120/79   Pulse 61   Temp (!) 96.9 F (36.1 C) (Temporal)   Ht 5\' 10"  (1.778 m)   Wt 239 lb 12.8 oz (108.8 kg)   SpO2 97%   BMI 34.41 kg/m   Wt Readings from Last 3 Encounters:  02/08/21 239 lb 12.8 oz (108.8 kg)  11/08/20 244 lb (110.7 kg)  05/08/20 187 lb (84.8 kg)    Physical Exam Vitals and nursing note reviewed.  Constitutional:      General: He is not in acute distress.    Appearance: He is well-developed and well-nourished. He is not diaphoretic.     Comments: Well-appearing, comfortable, cooperative  HENT:     Head: Normocephalic and atraumatic.     Mouth/Throat:     Mouth: Oropharynx is clear and moist.  Eyes:     General:        Right eye: No discharge.        Left eye: No discharge.     Conjunctiva/sclera: Conjunctivae normal.  Cardiovascular:     Rate  and Rhythm: Normal rate.  Pulmonary:     Effort: Pulmonary effort is normal.  Musculoskeletal:        General: No edema.  Skin:    General: Skin is warm and dry.     Findings: No erythema or rash.  Neurological:     Mental Status: He is alert and oriented to person, place, and time.  Psychiatric:        Mood and Affect: Mood and affect normal.        Behavior: Behavior normal.     Comments: Well groomed, good eye contact, normal speech and thoughts       Results for orders placed or performed in visit on 02/01/21  Lipid panel  Result Value Ref Range   Cholesterol 157 <200 mg/dL   HDL 37 (L) > OR = 40 mg/dL   Triglycerides 227 (H) <150 mg/dL   LDL Cholesterol (Calc) 88 mg/dL (calc)   Total CHOL/HDL Ratio 4.2 <5.0 (calc)   Non-HDL Cholesterol (Calc) 120 <130 mg/dL (calc)      Assessment & Plan:   Problem List Items Addressed This Visit    Pure hypercholesterolemia - Primary  Dramatic improved LDL to 88, prior >150 Last lipid panel 01/2021 The 10-year ASCVD risk score Vincent Clements DC Jr., et al., 2013) is: 7.6%  Plan: 1. Continue Rosuvastatin 10mg  nightly 90 day with refills - counseling on benefit risks side effect ASCVD reduction, dosing changes 2. Continue ASA 81mg  for primary ASCVD risk reduction - discuss with Cardiology Dr Clayborn Bigness may reduce or stop 3. Encourage improved lifestyle - low carb/cholesterol, reduce portion size, continue improving regular exercise         No orders of the defined types were placed in this encounter.    Follow up plan: Return in about 9 months (around 11/08/2021) for 9 month fasting lab only then 1 week later Annual Physical.  Future labs ordered for 10/2021   Vincent Clements, [facility] 02/08/2021, 8:18 AM

## 2021-02-08 NOTE — Patient Instructions (Addendum)
Thank you for coming to the office today.  Keep on current dosage of Rosuvastatin 10mg  nightly.  Excellent LDL reduction.  F/u with Sleep Study and Colonoscopy  DUE for FASTING BLOOD WORK (no food or drink after midnight before the lab appointment, only water or coffee without cream/sugar on the morning of)  SCHEDULE "Lab Only" visit in the morning at the clinic for lab draw in 9 MONTHS   - Make sure Lab Only appointment is at about 1 week before your next appointment, so that results will be available  For Lab Results, once available within 2-3 days of blood draw, you can can log in to MyChart online to view your results and a brief explanation. Also, we can discuss results at next follow-up visit.   Please schedule a Follow-up Appointment to: Return in about 9 months (around 11/08/2021) for 9 month fasting lab only then 1 week later Annual Physical.  If you have any other questions or concerns, please feel free to call the office or send a message through [place]. You may also schedule an earlier appointment if necessary.  Additionally, you may be receiving a survey about your experience at our office within a few days to 1 week by e-mail or mail. We value your feedback.  Nobie Putnam, DO [place]

## 2021-02-08 NOTE — Assessment & Plan Note (Signed)
Dramatic improved LDL to 88, prior >150 Last lipid panel 01/2021 The 10-year ASCVD risk score Mikey Bussing DC Jr., et al., 2013) is: 7.6%  Plan: 1. Continue Rosuvastatin 10mg  nightly 90 day with refills - counseling on benefit risks side effect ASCVD reduction, dosing changes 2. Continue ASA 81mg  for primary ASCVD risk reduction - discuss with Cardiology Dr Clayborn Bigness may reduce or stop 3. Encourage improved lifestyle - low carb/cholesterol, reduce portion size, continue improving regular exercise

## 2021-03-16 ENCOUNTER — Other Ambulatory Visit: Payer: Self-pay

## 2021-03-16 ENCOUNTER — Ambulatory Visit
Admission: RE | Admit: 2021-03-16 | Discharge: 2021-03-16 | Disposition: A | Discharge status: Home / Self Care | Payer: BC Managed Care – PPO | Source: Ambulatory Visit | Attending: Gastroenterology | Admitting: Gastroenterology

## 2021-03-16 DIAGNOSIS — Z01812 Encounter for preprocedural laboratory examination: Secondary | ICD-10-CM | POA: Insufficient documentation

## 2021-03-16 DIAGNOSIS — Z20822 Contact with and (suspected) exposure to covid-19: Secondary | ICD-10-CM | POA: Diagnosis not present

## 2021-03-16 LAB — SARS CORONAVIRUS 2 (TAT 6-24 HRS): SARS Coronavirus 2: NEGATIVE

## 2021-03-18 DIAGNOSIS — G4733 Obstructive sleep apnea (adult) (pediatric): Secondary | ICD-10-CM | POA: Diagnosis not present

## 2021-03-20 ENCOUNTER — Encounter
Admission: RE | Disposition: A | Discharge status: Home / Self Care | Payer: Self-pay | Source: Home / Self Care | Attending: Gastroenterology

## 2021-03-20 ENCOUNTER — Encounter: Payer: Self-pay | Admitting: Gastroenterology

## 2021-03-20 ENCOUNTER — Ambulatory Visit: Payer: BC Managed Care – PPO | Admitting: Anesthesiology

## 2021-03-20 ENCOUNTER — Ambulatory Visit
Admission: RE | Admit: 2021-03-20 | Discharge: 2021-03-20 | Disposition: A | Discharge status: Home / Self Care | Payer: BC Managed Care – PPO | Attending: Gastroenterology | Admitting: Gastroenterology

## 2021-03-20 ENCOUNTER — Other Ambulatory Visit: Payer: Self-pay

## 2021-03-20 DIAGNOSIS — Z86010 Personal history of colonic polyps: Secondary | ICD-10-CM | POA: Diagnosis not present

## 2021-03-20 DIAGNOSIS — K635 Polyp of colon: Secondary | ICD-10-CM

## 2021-03-20 DIAGNOSIS — Z1211 Encounter for screening for malignant neoplasm of colon: Secondary | ICD-10-CM | POA: Diagnosis not present

## 2021-03-20 DIAGNOSIS — Z7982 Long term (current) use of aspirin: Secondary | ICD-10-CM | POA: Insufficient documentation

## 2021-03-20 DIAGNOSIS — Z87891 Personal history of nicotine dependence: Secondary | ICD-10-CM | POA: Diagnosis not present

## 2021-03-20 DIAGNOSIS — Z823 Family history of stroke: Secondary | ICD-10-CM | POA: Diagnosis not present

## 2021-03-20 DIAGNOSIS — Z8249 Family history of ischemic heart disease and other diseases of the circulatory system: Secondary | ICD-10-CM | POA: Insufficient documentation

## 2021-03-20 DIAGNOSIS — D123 Benign neoplasm of transverse colon: Secondary | ICD-10-CM | POA: Diagnosis not present

## 2021-03-20 DIAGNOSIS — K5730 Diverticulosis of large intestine without perforation or abscess without bleeding: Secondary | ICD-10-CM | POA: Diagnosis not present

## 2021-03-20 DIAGNOSIS — K5790 Diverticulosis of intestine, part unspecified, without perforation or abscess without bleeding: Secondary | ICD-10-CM | POA: Diagnosis not present

## 2021-03-20 HISTORY — DX: Hyperlipidemia, unspecified: E78.5

## 2021-03-20 HISTORY — PX: COLONOSCOPY WITH PROPOFOL: SHX5780

## 2021-03-20 SURGERY — COLONOSCOPY WITH PROPOFOL
Anesthesia: General

## 2021-03-20 MED ORDER — LIDOCAINE HCL (CARDIAC) PF 100 MG/5ML IV SOSY
PREFILLED_SYRINGE | INTRAVENOUS | Status: DC | PRN
  Administered 2021-03-20: 100 mg via INTRAVENOUS

## 2021-03-20 MED ORDER — PROPOFOL 500 MG/50ML IV EMUL
INTRAVENOUS | Status: DC | PRN
  Administered 2021-03-20: 170 ug/kg/min via INTRAVENOUS

## 2021-03-20 MED ORDER — SODIUM CHLORIDE 0.9 % IV SOLN: INTRAVENOUS | Status: DC

## 2021-03-20 MED ORDER — GLYCOPYRROLATE 0.2 MG/ML IJ SOLN
INTRAMUSCULAR | Status: DC | PRN
  Administered 2021-03-20: .2 mg via INTRAVENOUS

## 2021-03-20 MED ORDER — PROPOFOL 10 MG/ML IV BOLUS
INTRAVENOUS | Status: DC | PRN
  Administered 2021-03-20: 60 mg via INTRAVENOUS
  Administered 2021-03-20 (×2): 20 mg via INTRAVENOUS

## 2021-03-20 NOTE — Transfer of Care (Signed)
Immediate Anesthesia Transfer of Care Note  Patient: Vincent Clements  Procedure(s) Performed: COLONOSCOPY WITH PROPOFOL (N/A )  Patient Location: PACU and Endoscopy Unit  Anesthesia Type:General  Level of Consciousness: drowsy  Airway & Oxygen Therapy: Patient Spontanous Breathing  Post-op Assessment: Report given to RN  Post vital signs: stable  Last Vitals:  Vitals Value Taken Time  BP    Temp    Pulse 96 03/20/21 0855  Resp 13 03/20/21 0855  SpO2 98 % 03/20/21 0855  Vitals shown include unvalidated device data.  Last Pain:  Vitals:   03/20/21 0736  TempSrc: Temporal  PainSc: 0-No pain         Complications: No complications documented.

## 2021-03-20 NOTE — Anesthesia Procedure Notes (Signed)
Procedure Name: General with mask airway Performed by: Fletcher-Harrison, Keriann Rankin, CRNA Pre-anesthesia Checklist: Patient identified, Emergency Drugs available, Suction available and Patient being monitored Patient Re-evaluated:Patient Re-evaluated prior to induction Oxygen Delivery Method: Simple face mask Induction Type: IV induction Placement Confirmation: positive ETCO2 and CO2 detector Dental Injury: Teeth and Oropharynx as per pre-operative assessment        

## 2021-03-20 NOTE — Anesthesia Postprocedure Evaluation (Signed)
Anesthesia Post Note  Patient: Vincent Clements  Procedure(s) Performed: COLONOSCOPY WITH PROPOFOL (N/A )  Patient location during evaluation: PACU Anesthesia Type: General Level of consciousness: awake and alert Pain management: pain level controlled Vital Signs Assessment: post-procedure vital signs reviewed and stable Respiratory status: spontaneous breathing, nonlabored ventilation and respiratory function stable Cardiovascular status: blood pressure returned to baseline and stable Postop Assessment: no apparent nausea or vomiting Anesthetic complications: no   No complications documented.   Last Vitals:  Vitals:   03/20/21 0910 03/20/21 0920  BP: 104/72 110/72  Pulse: (!) 47 67  Resp: 11 15  Temp:    SpO2: 98% 100%    Last Pain:  Vitals:   03/20/21 0850  TempSrc: Temporal  PainSc:                  Tera Mater

## 2021-03-20 NOTE — H&P (Signed)
[name], MD [address] [place], [place], [place], [address] [address], [place], [place], [place], [address] Phone: [phone]  Fax: [phone]  Primary Care Physician:  Olin Hauser, DO   Pre-Procedure History & Physical: HPI:  Vincent SHOULTZ is a 60 y.o. male is here for an colonoscopy.   Past Medical History:  Diagnosis Date  . Arthritis   . Hyperlipemia   . Hypertension     Past Surgical History:  Procedure Laterality Date  . CHOLECYSTECTOMY  10/06/2006  . COLONOSCOPY WITH PROPOFOL N/A 03/17/2018   Procedure: COLONOSCOPY WITH PROPOFOL;  Surgeon: [name], MD;  Location: [facility] ENDOSCOPY;  Service: Gastroenterology;  Laterality: N/A;  . KNEE ARTHROSCOPY Right 02/27/2017   Procedure: right knee arthroscopy, partial medial and lateral menisectomy;  Surgeon: Hessie Knows, MD;  Location: [facility] ORS;  Service: Orthopedics;  Laterality: Right;    Prior to Admission medications   Medication Sig Start Date End Date Taking? Authorizing Provider  aspirin EC 81 MG tablet Take 81 mg by mouth at bedtime.    Yes [provider]  carvedilol (COREG) 12.5 MG tablet TAKE 1 TABLET TWICE A DAY WITH MEALS 09/03/20  Yes Karamalegos, Alexander J, DO  fluticasone (FLONASE) 50 MCG/ACT nasal spray Place 2 sprays into both nostrils daily. Use for 4-6 weeks then stop and use seasonally or as needed. 11/08/20  Yes Karamalegos, Devonne Doughty, DO  losartan (COZAAR) 25 MG tablet Take 25 mg by mouth daily. 08/07/20  Yes [provider]  Multiple Vitamins-Minerals (CENTRUM SILVER 50+MEN PO) Take by mouth.   Yes [provider]  rosuvastatin (CRESTOR) 10 MG tablet Take 1 tablet (10 mg total) by mouth at bedtime. 12/06/20  Yes Olin Hauser, DO    Allergies as of 01/31/2021 - Review Complete 01/31/2021  Allergen Reaction Noted  . Lisinopril Cough 01/21/2017    Family History  Problem Relation Age of Onset  . Heart disease Mother   . Heart failure  Mother   . Stroke Maternal Grandfather     Social History   Socioeconomic History  . Marital status: Married    Spouse name: Not on file  . Number of children: Not on file  . Years of education: [employer]  . Highest education level: Not on file  Occupational History  . Occupation: Animal nutritionist  Tobacco Use  . Smoking status: Never Smoker  . Smokeless tobacco: Former Systems developer    Types: Chew, Snuff  . Tobacco comment: pt quit chewing tobacco 01/11/2006  Vaping Use  . Vaping Use: Never used  Substance and Sexual Activity  . Alcohol use: Not Currently    Comment: occasionally, 1 per week  . Drug use: No  . Sexual activity: Not on file  Other Topics Concern  . Not on file  Social History Narrative  . Not on file   Social Determinants of Health   Financial Resource Strain: Not on file  Food Insecurity: Not on file  Transportation Needs: Not on file  Physical Activity: Not on file  Stress: Not on file  Social Connections: Not on file  Intimate Partner Violence: Not on file    Review of Systems: See HPI, otherwise negative ROS  Physical Exam: BP (!) 143/87   Pulse 72   Temp 97.8 F (36.6 C) (Temporal)   Resp 16   Ht 5\' 6"  (1.676 m)   Wt 99.8 kg   SpO2 99%   BMI 35.51 kg/m  General:   Alert,  pleasant and cooperative in NAD Head:  Normocephalic and atraumatic. Neck:  Supple; no masses or thyromegaly. Lungs:  Clear throughout to auscultation, normal respiratory effort.    Heart:  +S1, +S2, Regular rate and rhythm, No edema. Abdomen:  Soft, nontender and nondistended. Normal bowel sounds, without guarding, and without rebound.   Neurologic:  Alert and  oriented x4;  grossly normal neurologically.  Impression/Plan: Vincent Clements is here for an colonoscopy to be performed for surveillance due to prior history of colon polyps   Risks, benefits, limitations, and alternatives regarding  colonoscopy have been reviewed with the patient.  Questions have been  answered.  All parties agreeable.   [name], MD  03/20/2021, 8:17 AM

## 2021-03-20 NOTE — Anesthesia Preprocedure Evaluation (Addendum)
Anesthesia Evaluation  Patient identified by MRN, date of birth, ID band Patient awake    Reviewed: Allergy & Precautions, H&P , NPO status , Patient's Chart, lab work & pertinent test results  History of Anesthesia Complications Negative for: history of anesthetic complications  Airway Mallampati: III  TM Distance: >3 FB     Dental  (+) Teeth Intact   Pulmonary neg pulmonary ROS, neg sleep apnea, neg COPD,    breath sounds clear to auscultation       Cardiovascular hypertension, (-) angina(-) Past MI and (-) Cardiac Stents + dysrhythmias Atrial Fibrillation  Rhythm:regular Rate:Normal     Neuro/Psych negative neurological ROS  negative psych ROS   GI/Hepatic negative GI ROS, Neg liver ROS,   Endo/Other  negative endocrine ROS  Renal/GU negative Renal ROS  negative genitourinary   Musculoskeletal   Abdominal   Peds  Hematology negative hematology ROS (+)   Anesthesia Other Findings Past Medical History: No date: Arthritis No date: Hyperlipemia No date: Hypertension  Past Surgical History: 10/06/2006: CHOLECYSTECTOMY 03/17/2018: COLONOSCOPY WITH PROPOFOL; N/A     Comment:  Procedure: COLONOSCOPY WITH PROPOFOL;  Surgeon: [name], MD;  Location[facility] ENDOSCOPY;  Service:               Gastroenterology;  Laterality: N/A; 02/27/2017: KNEE ARTHROSCOPY; Right     Comment:  Procedure: right knee arthroscopy, partial medial and               lateral menisectomy;  Surgeon: Hessie Knows, MD;                Location: [facility] ORS;  Service: Orthopedics;  Laterality:               Right;  BMI    Body Mass Index: 35.51 kg/m      Reproductive/Obstetrics negative OB ROS                            Anesthesia Physical Anesthesia Plan  ASA: II  Anesthesia Plan: General   Post-op Pain Management:    Induction:   PONV Risk Score and Plan: Propofol infusion and TIVA  Airway  Management Planned: Nasal Cannula  Additional Equipment:   Intra-op Plan:   Post-operative Plan:   Informed Consent: I have reviewed the patients History and Physical, chart, labs and discussed the procedure including the risks, benefits and alternatives for the proposed anesthesia with the patient or authorized representative who has indicated his/her understanding and acceptance.     Dental Advisory Given  Plan Discussed with: Anesthesiologist, CRNA and Surgeon  Anesthesia Plan Comments:         Anesthesia Quick Evaluation

## 2021-03-20 NOTE — Op Note (Signed)
[facility] Patient Name: Vincent Clements Procedure Date: 03/20/2021 8:20 AM MRN: [id number] Account #: [id number] Date of Birth: 03-30-1961 Admit Type: Outpatient Age: 60 Room: [facility] ENDO ROOM 2 Gender: Male Note Status: Finalized Procedure:             Colonoscopy Indications:           Surveillance: Personal history of colonic polyps                         (unknown histology) on last colonoscopy 3 years ago,                         Last colonoscopy: March 2019 Providers:             [name] MD, MD Medicines:             Monitored Anesthesia Care Complications:         No immediate complications. Procedure:             Pre-Anesthesia Assessment:                        - Prior to the procedure, a History and Physical was                         performed, and patient medications, allergies and                         sensitivities were reviewed. The patient's tolerance                         of previous anesthesia was reviewed.                        - The risks and benefits of the procedure and the                         sedation options and risks were discussed with the                         patient. All questions were answered and informed                         consent was obtained.                        - ASA Grade Assessment: II - A patient with mild                         systemic disease.                        After obtaining informed consent, the colonoscope was                         passed under direct vision. Throughout the procedure,                         the patient's blood pressure, pulse, and oxygen  saturations were monitored continuously. The                         Colonoscope was introduced through the anus and                         advanced to the the cecum, identified by the                         appendiceal orifice. The colonoscopy was performed                         with ease. The  patient tolerated the procedure well.                         The quality of the bowel preparation was excellent. Findings:      The perianal and digital rectal examinations were normal.      A 5 mm polyp was found in the transverse colon. The polyp was sessile.       The polyp was removed with a cold snare. Resection and retrieval were       complete.      The entire examined colon appeared normal.      Multiple small-mouthed diverticula were found in the sigmoid colon.      The exam was otherwise without abnormality on direct and retroflexion       views. Impression:            - One 5 mm polyp in the transverse colon, removed with                         a cold snare. Resected and retrieved.                        - The entire examined colon is normal.                        - Diverticulosis in the sigmoid colon.                        - The examination was otherwise normal on direct and                         retroflexion views. Recommendation:        - Discharge patient to home (with escort).                        - Resume previous diet.                        - Continue present medications.                        - Await pathology results.                        - Repeat colonoscopy for surveillance based on                         pathology results. Procedure Code(s):     --- Professional ---  45385, Colonoscopy, flexible; with removal of                         tumor(s), polyp(s), or other lesion(s) by snare                         technique Diagnosis Code(s):     --- Professional ---                        Z86.010, Personal history of colonic polyps                        K63.5, Polyp of colon                        K57.30, Diverticulosis of large intestine without                         perforation or abscess without bleeding CPT copyright 2019 American Medical Association. All rights reserved. The codes documented in this report are preliminary and  upon coder review may  be revised to meet current compliance requirements. [name], MD [name] MD, MD 03/20/2021 8:53:02 AM This report has been signed electronically. Number of Addenda: 0 Note Initiated On: 03/20/2021 8:20 AM Scope Withdrawal Time: 0 hours 18 minutes 27 seconds  Total Procedure Duration: 0 hours 21 minutes 22 seconds  Estimated Blood Loss:  Estimated blood loss: none.      [facility]

## 2021-03-21 ENCOUNTER — Encounter: Payer: Self-pay | Admitting: Gastroenterology

## 2021-03-21 LAB — SURGICAL PATHOLOGY

## 2021-04-05 DIAGNOSIS — G4733 Obstructive sleep apnea (adult) (pediatric): Secondary | ICD-10-CM | POA: Diagnosis not present

## 2021-04-05 DIAGNOSIS — J310 Chronic rhinitis: Secondary | ICD-10-CM | POA: Diagnosis not present

## 2021-08-01 DIAGNOSIS — G4733 Obstructive sleep apnea (adult) (pediatric): Secondary | ICD-10-CM | POA: Diagnosis not present

## 2021-08-01 DIAGNOSIS — I10 Essential (primary) hypertension: Secondary | ICD-10-CM | POA: Diagnosis not present

## 2021-08-01 DIAGNOSIS — I2510 Atherosclerotic heart disease of native coronary artery without angina pectoris: Secondary | ICD-10-CM | POA: Diagnosis not present

## 2021-08-01 DIAGNOSIS — Z8639 Personal history of other endocrine, nutritional and metabolic disease: Secondary | ICD-10-CM | POA: Diagnosis not present

## 2021-09-18 ENCOUNTER — Other Ambulatory Visit: Payer: Self-pay

## 2021-09-18 ENCOUNTER — Ambulatory Visit (INDEPENDENT_AMBULATORY_CARE_PROVIDER_SITE_OTHER): Payer: BC Managed Care – PPO | Admitting: Family Medicine

## 2021-09-18 VITALS — Wt 220.0 lb

## 2021-09-18 DIAGNOSIS — Z23 Encounter for immunization: Secondary | ICD-10-CM

## 2021-09-20 DIAGNOSIS — G4733 Obstructive sleep apnea (adult) (pediatric): Secondary | ICD-10-CM | POA: Diagnosis not present

## 2021-10-15 ENCOUNTER — Other Ambulatory Visit: Payer: Self-pay | Admitting: Family Medicine

## 2021-10-16 NOTE — Telephone Encounter (Signed)
Requested medications are due for refill today yes  Requested medications are on the active medication list yes  Last refill 07/23/21  Last visit 02/08/21  Future visit scheduled 11/12/21  Notes to clinic Failed protocol due to no valid visit within 6  months, please assess.  Requested Prescriptions  Pending Prescriptions Disp Refills   carvedilol (COREG) 12.5 MG tablet [Pharmacy Med Name: CARVEDILOL (IR) TABS 12.5MG ] 180 tablet 3    Sig: TAKE 1 TABLET TWICE A DAY WITH MEALS     Cardiovascular:  Beta Blockers Failed - 10/15/2021  8:38 PM      Failed - Valid encounter within last 6 months    Recent Outpatient Visits           4 weeks ago Needs flu shot   [place], DO   8 months ago Pure hypercholesterolemia   [place], DO   11 months ago Annual physical exam   [facility] Olin Hauser, DO   1 year ago Essential hypertension   [place], Devonne Doughty, DO   1 year ago Annual physical exam   [place], DO       Future Appointments             In 3 weeks [place], Devonne Doughty, DO [facility] [place] BP in normal range    BP Readings from Last 1 Encounters:  03/20/21 110/72          Passed - Last Heart Rate in normal range    Pulse Readings from Last 1 Encounters:  03/20/21 67

## 2021-10-20 DIAGNOSIS — G4733 Obstructive sleep apnea (adult) (pediatric): Secondary | ICD-10-CM | POA: Diagnosis not present

## 2021-10-26 DIAGNOSIS — G4733 Obstructive sleep apnea (adult) (pediatric): Secondary | ICD-10-CM | POA: Diagnosis not present

## 2021-11-02 ENCOUNTER — Other Ambulatory Visit: Payer: Self-pay

## 2021-11-02 DIAGNOSIS — I10 Essential (primary) hypertension: Secondary | ICD-10-CM

## 2021-11-02 DIAGNOSIS — Z0000 Encounter for general adult medical examination without abnormal findings: Secondary | ICD-10-CM

## 2021-11-02 DIAGNOSIS — E7800 Pure hypercholesterolemia, unspecified: Secondary | ICD-10-CM

## 2021-11-02 DIAGNOSIS — I480 Paroxysmal atrial fibrillation: Secondary | ICD-10-CM

## 2021-11-02 DIAGNOSIS — R7309 Other abnormal glucose: Secondary | ICD-10-CM

## 2021-11-02 DIAGNOSIS — R351 Nocturia: Secondary | ICD-10-CM

## 2021-11-05 ENCOUNTER — Other Ambulatory Visit: Payer: BC Managed Care – PPO

## 2021-11-05 DIAGNOSIS — R351 Nocturia: Secondary | ICD-10-CM | POA: Diagnosis not present

## 2021-11-05 DIAGNOSIS — I480 Paroxysmal atrial fibrillation: Secondary | ICD-10-CM | POA: Diagnosis not present

## 2021-11-05 DIAGNOSIS — I10 Essential (primary) hypertension: Secondary | ICD-10-CM | POA: Diagnosis not present

## 2021-11-05 DIAGNOSIS — R7309 Other abnormal glucose: Secondary | ICD-10-CM | POA: Diagnosis not present

## 2021-11-05 DIAGNOSIS — E7800 Pure hypercholesterolemia, unspecified: Secondary | ICD-10-CM | POA: Diagnosis not present

## 2021-11-06 LAB — COMPLETE METABOLIC PANEL WITH GFR
AG Ratio: 2 (calc) (ref 1.0–2.5)
ALT: 29 U/L (ref 9–46)
AST: 19 U/L (ref 10–35)
Albumin: 4.5 g/dL (ref 3.6–5.1)
Alkaline phosphatase (APISO): 56 U/L (ref 35–144)
BUN: 20 mg/dL (ref 7–25)
CO2: 30 mmol/L (ref 20–32)
Calcium: 9.8 mg/dL (ref 8.6–10.3)
Chloride: 104 mmol/L (ref 98–110)
Creat: 0.76 mg/dL (ref 0.70–1.30)
Globulin: 2.3 g/dL (calc) (ref 1.9–3.7)
Glucose, Bld: 107 mg/dL — ABNORMAL HIGH (ref 65–99)
Potassium: 4.8 mmol/L (ref 3.5–5.3)
Sodium: 140 mmol/L (ref 135–146)
Total Bilirubin: 0.4 mg/dL (ref 0.2–1.2)
Total Protein: 6.8 g/dL (ref 6.1–8.1)
eGFR: 104 mL/min/{1.73_m2} (ref >=60)

## 2021-11-06 LAB — CBC WITH DIFFERENTIAL/PLATELET
Absolute Monocytes: 614 cells/uL (ref 200–950)
Basophils Absolute: 62 cells/uL (ref 0–200)
Basophils Relative: 1 %
Eosinophils Absolute: 229 cells/uL (ref 15–500)
Eosinophils Relative: 3.7 %
HCT: 47.2 % (ref 38.5–50.0)
Hemoglobin: 15.2 g/dL (ref 13.2–17.1)
Lymphs Abs: 1197 cells/uL (ref 850–3900)
MCH: 30.5 pg (ref 27.0–33.0)
MCHC: 32.2 g/dL (ref 32.0–36.0)
MCV: 94.6 fL (ref 80.0–100.0)
MPV: 10.8 fL (ref 7.5–12.5)
Monocytes Relative: 9.9 %
Neutro Abs: 4098 cells/uL (ref 1500–7800)
Neutrophils Relative %: 66.1 %
Platelets: 307 10*3/uL (ref 140–400)
RBC: 4.99 10*6/uL (ref 4.20–5.80)
RDW: 13 % (ref 11.0–15.0)
Total Lymphocyte: 19.3 %
WBC: 6.2 10*3/uL (ref 3.8–10.8)

## 2021-11-06 LAB — LIPID PANEL
Cholesterol: 165 mg/dL (ref <200)
HDL: 56 mg/dL (ref >=40)
LDL Cholesterol (Calc): 84 mg/dL (calc)
Non-HDL Cholesterol (Calc): 109 mg/dL (calc) (ref <130)
Total CHOL/HDL Ratio: 2.9 (calc) (ref <5.0)
Triglycerides: 150 mg/dL — ABNORMAL HIGH (ref <150)

## 2021-11-06 LAB — HEMOGLOBIN A1C
Hgb A1c MFr Bld: 5.1 % of total Hgb (ref <5.7)
Mean Plasma Glucose: 100 mg/dL
eAG (mmol/L): 5.5 mmol/L

## 2021-11-06 LAB — PSA: PSA: 0.89 ng/mL (ref <=4.00)

## 2021-11-06 LAB — TSH: TSH: 1.17 mIU/L (ref 0.40–4.50)

## 2021-11-12 ENCOUNTER — Other Ambulatory Visit: Payer: Self-pay

## 2021-11-12 ENCOUNTER — Ambulatory Visit (INDEPENDENT_AMBULATORY_CARE_PROVIDER_SITE_OTHER): Payer: BC Managed Care – PPO | Admitting: Family Medicine

## 2021-11-12 ENCOUNTER — Encounter: Payer: Self-pay | Admitting: Family Medicine

## 2021-11-12 VITALS — BP 125/63 | HR 61 | Ht 66.0 in | Wt 204.0 lb

## 2021-11-12 DIAGNOSIS — Z9989 Dependence on other enabling machines and devices: Secondary | ICD-10-CM

## 2021-11-12 DIAGNOSIS — G4733 Obstructive sleep apnea (adult) (pediatric): Secondary | ICD-10-CM | POA: Diagnosis not present

## 2021-11-12 DIAGNOSIS — E669 Obesity, unspecified: Secondary | ICD-10-CM

## 2021-11-12 DIAGNOSIS — E7800 Pure hypercholesterolemia, unspecified: Secondary | ICD-10-CM

## 2021-11-12 DIAGNOSIS — I10 Essential (primary) hypertension: Secondary | ICD-10-CM

## 2021-11-12 DIAGNOSIS — I480 Paroxysmal atrial fibrillation: Secondary | ICD-10-CM

## 2021-11-12 DIAGNOSIS — R7309 Other abnormal glucose: Secondary | ICD-10-CM | POA: Diagnosis not present

## 2021-11-12 NOTE — Progress Notes (Signed)
Subjective:    Patient ID: Vincent Clements, male    DOB: 16-Jun-1961, 60 y.o.   MRN: [id number]  Vincent Clements is a 60 y.o. male presenting on 11/12/2021 for Annual Exam   HPI  Here for Annual Physical and Lab Review.  HYPERLIPIDEMIA: - Reports no concerns. Last lipid panel improved LDL 84 and total cholesterol Currently on Rosuvastatin 50m nightly, tolerating well without muscle ache myalgia Followed by Cardiology He admits occasional mild headaches for few weeks - these have now resolved  Mild Elevated A1c without dx Pre-Diabetes  Last A1c 5.1 major improvement. CBGs: Not checking Meds: never on med Reports good compliance. Tolerating well w/o side-effects Currently not on ACEi/ARB - off losartan Lifestyle: Weight gain - Diet (goal to increase water intake, goal to limit red meat and improve diet, inc vegetables - limits sugar - Exercise (active but limited exercise at times with knee, good days and bad days) Denies hypoglycemia  OSA, on CPAP - Patient reports prior history of dx OSA and on CPAP since Sept 2022, he had initial sleep study 02/2021, [place] Mostly doing well with it but occasional nights can wake up early due to mask - Overall doing well more rested during daytime, less daytime sleepiness and fatigue. - Today reports that sleep apnea is well controlled. He uses the CPAP machine every night. Tolerates the machine well, and thinks that sleeps better with it and feels good. No new concerns or symptoms.    CHRONIC HTN: Improved BP overall.  Current Meds - Carvedilol 12.570mBID, Losartan 2580maily Reports good compliance, took meds today. Tolerating well, w/o complaints. Denies CP, dyspnea, HA, edema, dizziness / lightheadedness    Morbid Obesity BMI >32 Weight down 16 lbs. Admits goal to resume lifestyle changes - Diet (goal to increase water intake, avoiding red meat and improve diet, inc vegetables) - Exercise (active but limited exercise at times  with knee, good days and bad days)   Atrial Fibrillation, Paroxysmal [facility] CalClayborn Bignessollowed now yearly, continues med management, remains off ARB, they continued BB He does not feel episodes of afib He had July 2021 ECHOcardiogram. He had Stress Test as well. Episodic chest pains vs heaviness, discussed with Dr CalClayborn Bignesse kept track of episodes, had some episodes in Aug/Sept then has resolved. He attributes it to indigestion and heart burn.  Additional concern  R neck mole Right side with abnormal pigmented mole unsure how long it has been there. No pain or other concerns.  He has seen Dr KowNehemiah Massed past. He would return.    Health Maintenance:  Declines COVID19 booster.   Prostate CA Screening: No prior DRE. Last PSA 0.89 (10/2021). Currently asymptomatic except for mild nocturia seems improved after weight loss, but he drinks frequent water. Denies BPH LUTS. No known family history of prostate CA.    Routine Hep C and HIV screening were negative.   Colon CA Screening: Last Colonoscopy 03/20/21 (done by Dr KirJonathon BellowsI), results with x 1 polyp repeat in 7 years. Currently asymptomatic. No known family history of colon CA.      [facility] 11/12/2021 11/12/2021 11/08/2020  Decreased Interest 0 0 0  Down, Depressed, Hopeless 0 0 0  PHQ - 2 Score 0 0 0  Altered sleeping - 0 -  Tired, decreased energy - 0 -  Change in appetite - 0 -  Feeling bad or failure about yourself  - 0 -  Trouble concentrating - 0 -  Moving slowly or fidgety/restless - 0 -  Suicidal thoughts - 0 -  PHQ-9 Score - 0 -  Difficult doing work/chores - Not difficult at all -    Past Medical History:  Diagnosis Date   Arthritis    Hyperlipemia    Hypertension    Past Surgical History:  Procedure Laterality Date   CHOLECYSTECTOMY  10/06/2006   COLONOSCOPY WITH PROPOFOL N/A 03/17/2018   Procedure: COLONOSCOPY WITH PROPOFOL;  Surgeon: [name], MD;  Location:  [facility] ENDOSCOPY;  Service: Gastroenterology;  Laterality: N/A;   COLONOSCOPY WITH PROPOFOL N/A 03/20/2021   Procedure: COLONOSCOPY WITH PROPOFOL;  Surgeon: [name], MD;  Location: [facility] ENDOSCOPY;  Service: Gastroenterology;  Laterality: N/A;   KNEE ARTHROSCOPY Right 02/27/2017   Procedure: right knee arthroscopy, partial medial and lateral menisectomy;  Surgeon: Hessie Knows, MD;  Location: [facility] ORS;  Service: Orthopedics;  Laterality: Right;   Social History   Socioeconomic History   Marital status: Married    Spouse name: Not on file   Number of children: Not on file   Years of education: High School   Highest education level: Not on file  Occupational History   Occupation: Animal nutritionist  Tobacco Use   Smoking status: Never   Smokeless tobacco: Former    Types: Chew, Snuff    Quit date: 12/31/1991   Tobacco comments:    pt quit chewing tobacco 01/11/2006  Vaping Use   Vaping Use: Never used  Substance and Sexual Activity   Alcohol use: Not Currently    Comment: occasionally, 1 per week   Drug use: No   Sexual activity: Not on file  Other Topics Concern   Not on file  Social History Narrative   Not on file   Social Determinants of Health   Financial Resource Strain: Not on file  Food Insecurity: Not on file  Transportation Needs: Not on file  Physical Activity: Not on file  Stress: Not on file  Social Connections: Not on file  Intimate Partner Violence: Not on file   Family History  Problem Relation Age of Onset   Heart disease Mother    Heart failure Mother    Stroke Maternal Grandfather    Current Outpatient Medications on File Prior to Visit  Medication Sig   aspirin EC 81 MG tablet Take 81 mg by mouth at bedtime.    carvedilol (COREG) 12.5 MG tablet TAKE 1 TABLET TWICE A DAY WITH MEALS   fluticasone (FLONASE) 50 MCG/ACT nasal spray Place 2 sprays into both nostrils daily. Use for 4-6 weeks then stop and use seasonally or as needed.   losartan (COZAAR)  25 MG tablet Take 25 mg by mouth daily.   Multiple Vitamins-Minerals (CENTRUM SILVER 50+MEN PO) Take by mouth.   rosuvastatin (CRESTOR) 10 MG tablet Take 1 tablet (10 mg total) by mouth at bedtime.   No current facility-administered medications on file prior to visit.    Review of Systems  Constitutional:  Negative for activity change, appetite change, chills, diaphoresis, fatigue and fever.  HENT:  Negative for congestion and hearing loss.   Eyes:  Negative for visual disturbance.  Respiratory:  Negative for cough, chest tightness, shortness of breath and wheezing.   Cardiovascular:  Negative for chest pain, palpitations and leg swelling.  Gastrointestinal:  Negative for abdominal pain, constipation, diarrhea, nausea and vomiting.  Genitourinary:  Negative for dysuria, frequency and hematuria.  Musculoskeletal:  Negative for arthralgias and neck pain.  Skin:  Negative for rash.  Neurological:  Negative for dizziness, weakness, light-headedness, numbness and headaches.  Hematological:  Negative for adenopathy.  Psychiatric/Behavioral:  Negative for behavioral problems, dysphoric mood and sleep disturbance.   Per HPI unless specifically indicated above      Objective:    BP 125/63   Pulse 61   Ht 5' 6" (1.676 m)   Wt 204 lb (92.5 kg)   SpO2 100%   BMI 32.93 kg/m   Wt Readings from Last 3 Encounters:  11/12/21 204 lb (92.5 kg)  09/18/21 220 lb (99.8 kg)  03/20/21 220 lb (99.8 kg)    Physical Exam Vitals and nursing note reviewed.  Constitutional:      General: He is not in acute distress.    Appearance: He is well-developed. He is not diaphoretic.     Comments: Well-appearing, comfortable, cooperative  HENT:     Head: Normocephalic and atraumatic.  Eyes:     General:        Right eye: No discharge.        Left eye: No discharge.     Conjunctiva/sclera: Conjunctivae normal.     Pupils: Pupils are equal, round, and reactive to light.  Neck:     Thyroid: No  thyromegaly.  Cardiovascular:     Rate and Rhythm: Normal rate and regular rhythm.     Pulses: Normal pulses.     Heart sounds: Normal heart sounds. No murmur heard. Pulmonary:     Effort: Pulmonary effort is normal. No respiratory distress.     Breath sounds: Normal breath sounds. No wheezing or rales.  Abdominal:     General: Bowel sounds are normal. There is no distension.     Palpations: Abdomen is soft. There is no mass.     Tenderness: There is no abdominal tenderness.  Musculoskeletal:        General: No tenderness. Normal range of motion.     Cervical back: Normal range of motion and neck supple.     Comments: Upper / Lower Extremities: - Normal muscle tone, strength bilateral upper extremities 5/5, lower extremities 5/5  Lymphadenopathy:     Cervical: No cervical adenopathy.  Skin:    General: Skin is warm and dry.     Findings: No erythema or rash.  Neurological:     Mental Status: He is alert and oriented to person, place, and time.     Comments: Distal sensation intact to light touch all extremities  Psychiatric:        Mood and Affect: Mood normal.        Behavior: Behavior normal.        Thought Content: Thought content normal.     Comments: Well groomed, good eye contact, normal speech and thoughts     Results for orders placed or performed in visit on 11/02/21  TSH  Result Value Ref Range   TSH 1.17 0.40 - 4.50 mIU/L  PSA  Result Value Ref Range   PSA 0.89 < OR = 4.00 ng/mL  Lipid panel  Result Value Ref Range   Cholesterol 165 <200 mg/dL   HDL 56 > OR = 40 mg/dL   Triglycerides 150 (H) <150 mg/dL   LDL Cholesterol (Calc) 84 mg/dL (calc)   Total CHOL/HDL Ratio 2.9 <5.0 (calc)   Non-HDL Cholesterol (Calc) 109 <130 mg/dL (calc)  COMPLETE METABOLIC PANEL WITH GFR  Result Value Ref Range   Glucose, Bld 107 (H) 65 - 99 mg/dL   BUN 20  7 - 25 mg/dL   Creat 0.76 0.70 - 1.30 mg/dL   eGFR 104 > OR = 60 mL/min/1.58m   BUN/Creatinine Ratio NOT APPLICABLE 6 -  22 (calc)   Sodium 140 135 - 146 mmol/L   Potassium 4.8 3.5 - 5.3 mmol/L   Chloride 104 98 - 110 mmol/L   CO2 30 20 - 32 mmol/L   Calcium 9.8 8.6 - 10.3 mg/dL   Total Protein 6.8 6.1 - 8.1 g/dL   Albumin 4.5 3.6 - 5.1 g/dL   Globulin 2.3 1.9 - 3.7 g/dL (calc)   AG Ratio 2.0 1.0 - 2.5 (calc)   Total Bilirubin 0.4 0.2 - 1.2 mg/dL   Alkaline phosphatase (APISO) 56 35 - 144 U/L   AST 19 10 - 35 U/L   ALT 29 9 - 46 U/L  CBC with Differential/Platelet  Result Value Ref Range   WBC 6.2 3.8 - 10.8 Thousand/uL   RBC 4.99 4.20 - 5.80 Million/uL   Hemoglobin 15.2 13.2 - 17.1 g/dL   HCT 47.2 38.5 - 50.0 %   MCV 94.6 80.0 - 100.0 fL   MCH 30.5 27.0 - 33.0 pg   MCHC 32.2 32.0 - 36.0 g/dL   RDW 13.0 11.0 - 15.0 %   Platelets 307 140 - 400 Thousand/uL   MPV 10.8 7.5 - 12.5 fL   Neutro Abs 4,098 1,500 - 7,800 cells/uL   Lymphs Abs 1,197 850 - 3,900 cells/uL   Absolute Monocytes 614 200 - 950 cells/uL   Eosinophils Absolute 229 15 - 500 cells/uL   Basophils Absolute 62 0 - 200 cells/uL   Neutrophils Relative % 66.1 %   Total Lymphocyte 19.3 %   Monocytes Relative 9.9 %   Eosinophils Relative 3.7 %   Basophils Relative 1.0 %  Hemoglobin A1c  Result Value Ref Range   Hgb A1c MFr Bld 5.1 <5.7 % of total Hgb   Mean Plasma Glucose 100 mg/dL   eAG (mmol/L) 5.5 mmol/L      Assessment & Plan:   Problem List Items Addressed This Visit     Pure hypercholesterolemia    Dramatic improved LDL to 84 Last lipid panel 11./2022 The 10-year ASCVD risk score (Arnett DK, et al., 2019) is: 6.2%  Plan: 1. Continue Rosuvastatin 126mnightly 2. Continue ASA 812mor primary ASCVD risk reduction - discuss with Cardiology Dr CalClayborn Bignessy reduce or stop 3. Encourage improved lifestyle - low carb/cholesterol, reduce portion size, continue improving regular exercise      Paroxysmal atrial fibrillation (HCC)    Stable, without active AFib Followed by Dr CalClayborn Bigness BB      OSA on CPAP    Well  controlled, chronic OSA on CPAP, now 2 months - Good adherence to CPAP nightly - Continue current CPAP therapy, patient seems to be benefiting from therapy       Obesity (BMI 30.0-34.9) - Primary    BMI >32 with morbid obesity, co morbid HTN HLD Elevated A1c, PAFib. Encourage lifestyle diet exercise weight loss      Essential hypertension    Controlled BP Normal readings outside office OFF ACEi (cough) Complication with Systolic CHF NICM, AFib [facility] CalClayborn Bigness Plan:  1. Continue Losartan 16m28mily back on ARB, Carvedilol 12.5mg 62m - Cardiology may consider dose reduction in future 2. Encourage improved lifestyle - low sodium diet, regular exercise - keep up maintaining weight loss 3. Continue monitor BP outside office, bring readings to next  visit, if persistently >140/90 or new symptoms notify office sooner      Elevated hemoglobin A1c    Controlled A1c 5.1 Controlled HTN.  Plan:  1. Not on any therapy currently  2. Encourage improved lifestyle - low carb, low sugar diet, reduce portion size, continue improving regular exercise  Check yearly       Updated Health Maintenance information UTD Flu Shot Declines COVID booster Colonoscopy completed 02/2021 - AGI - next due in 7 years. Reviewed recent lab results with patient Encouraged improvement to lifestyle with diet and exercise Goal of weight loss   No orders of the defined types were placed in this encounter.     Follow up plan: Return in about 1 year (around 11/12/2022) for 1 year fasting lab only then 1 week later Annual Physical.  Nobie Putnam, DO [facility] 11/12/2021, 8:15 AM

## 2021-11-12 NOTE — Assessment & Plan Note (Signed)
Controlled A1c 5.1 Controlled HTN.  Plan:  1. Not on any therapy currently  2. Encourage improved lifestyle - low carb, low sugar diet, reduce portion size, continue improving regular exercise  Check yearly

## 2021-11-12 NOTE — Assessment & Plan Note (Signed)
Stable, without active AFib Followed by Dr Callwood On BB 

## 2021-11-12 NOTE — Assessment & Plan Note (Signed)
Controlled BP Normal readings outside office OFF ACEi (cough) Complication with Systolic CHF NICM, AFib Followed by [facility] Cardiology Dr Clayborn Bigness    Plan:  1. Continue Losartan 25mg  daily back on ARB, Carvedilol 12.5mg  BID - Cardiology may consider dose reduction in future 2. Encourage improved lifestyle - low sodium diet, regular exercise - keep up maintaining weight loss 3. Continue monitor BP outside office, bring readings to next visit, if persistently >140/90 or new symptoms notify office sooner

## 2021-11-12 NOTE — Assessment & Plan Note (Signed)
Well controlled, chronic OSA on CPAP, now 2 months - Good adherence to CPAP nightly - Continue current CPAP therapy, patient seems to be benefiting from therapy

## 2021-11-12 NOTE — Patient Instructions (Addendum)
Thank you for coming to the office today.  Excellent lab results overall!  Let me know on med refills.  Call to schedule w/ Dr Nehemiah Massed [facility] for abnormal new mole R side of neck.  You most likely have Plantar Fasciitis of heel / foot. - This is inflammation of the fibrous connection on the bottom of the foot, and can have small micro tears over time that become painful. It usually will have flare ups lasting days to weeks, and may come back after it heals if it is re-aggravated again. - Often there is a bone spur or arthritis of the heel bone that causes this - Also it may be caused by abnormal footwear, walking pattern or other problems  If you are experiencing an acute flare with pain, this is usually worst first thing in the morning when the plantar fascia is tight and stiff. First step out of bed is painful usually, and it may gradually improve with stretching and walking.  Recommend: - Rest / relative rest with activity modification avoid overuse / prolonged stand - Ice packs (make sure you use a towel or sock / something to protect skin)  May also try topical muscle rub, icy hot, tiger balm  Start the exercises listed below, gradually increase them as instructed. May be sore at first and hopefully will stretch out and help reduce pain later.  If not improving after 1-2 weeks on medicine, and stretching exercises and some rest - then can contact our office again for re-evaluation may consider other causes or can refer you to Orthopedic specialist to have second opinion, and consider a steroid injection.    Please schedule a Follow-up Appointment to: Return in about 1 year (around 11/12/2022) for 1 year fasting lab only then 1 week later Annual Physical.  If you have any other questions or concerns, please feel free to call the office or send a message through [place]. You may also schedule an earlier appointment if necessary.  Additionally, you may be receiving a  survey about your experience at our office within a few days to 1 week by e-mail or mail. We value your feedback.  Nobie Putnam, DO [place]

## 2021-11-12 NOTE — Assessment & Plan Note (Signed)
BMI >32 with morbid obesity, co morbid HTN HLD Elevated A1c, PAFib. Encourage lifestyle diet exercise weight loss

## 2021-11-12 NOTE — Assessment & Plan Note (Signed)
Dramatic improved LDL to 84 Last lipid panel 11./2022 The 10-year ASCVD risk score (Arnett DK, et al., 2019) is: 6.2%  Plan: 1. Continue Rosuvastatin 10mg  nightly 2. Continue ASA 81mg  for primary ASCVD risk reduction - discuss with Cardiology Dr Clayborn Bigness may reduce or stop 3. Encourage improved lifestyle - low carb/cholesterol, reduce portion size, continue improving regular exercise

## 2021-11-20 DIAGNOSIS — G4733 Obstructive sleep apnea (adult) (pediatric): Secondary | ICD-10-CM | POA: Diagnosis not present

## 2021-11-26 DIAGNOSIS — G4733 Obstructive sleep apnea (adult) (pediatric): Secondary | ICD-10-CM | POA: Diagnosis not present

## 2021-12-20 DIAGNOSIS — G4733 Obstructive sleep apnea (adult) (pediatric): Secondary | ICD-10-CM | POA: Diagnosis not present

## 2021-12-26 DIAGNOSIS — G4733 Obstructive sleep apnea (adult) (pediatric): Secondary | ICD-10-CM | POA: Diagnosis not present

## 2022-01-20 DIAGNOSIS — G4733 Obstructive sleep apnea (adult) (pediatric): Secondary | ICD-10-CM | POA: Diagnosis not present

## 2022-01-24 ENCOUNTER — Ambulatory Visit: Payer: BC Managed Care – PPO | Admitting: Dermatology

## 2022-01-24 ENCOUNTER — Other Ambulatory Visit: Payer: Self-pay

## 2022-01-24 DIAGNOSIS — D229 Melanocytic nevi, unspecified: Secondary | ICD-10-CM

## 2022-01-24 DIAGNOSIS — L578 Other skin changes due to chronic exposure to nonionizing radiation: Secondary | ICD-10-CM | POA: Diagnosis not present

## 2022-01-24 DIAGNOSIS — L821 Other seborrheic keratosis: Secondary | ICD-10-CM

## 2022-01-24 DIAGNOSIS — L820 Inflamed seborrheic keratosis: Secondary | ICD-10-CM | POA: Diagnosis not present

## 2022-01-24 DIAGNOSIS — D039 Melanoma in situ, unspecified: Secondary | ICD-10-CM

## 2022-01-24 DIAGNOSIS — D034 Melanoma in situ of scalp and neck: Secondary | ICD-10-CM

## 2022-01-24 DIAGNOSIS — D492 Neoplasm of unspecified behavior of bone, soft tissue, and skin: Secondary | ICD-10-CM

## 2022-01-24 HISTORY — DX: Melanoma in situ, unspecified: D03.9

## 2022-01-24 NOTE — Patient Instructions (Addendum)

## 2022-01-24 NOTE — Progress Notes (Signed)
° °  New Patient Visit  Subjective  Vincent Clements is a 61 y.o. male who presents for the following: check brown spot (R neck, noticed 11/22, no symptoms) and tags (Neck, no symptoms). The patient has spots, moles and lesions to be evaluated, some may be new or changing and the patient has concerns that these could be cancer.  New patient referral from Nobie Putnam, DO.  The following portions of the chart were reviewed this encounter and updated as appropriate:   Tobacco   Allergies   Meds   Problems   Med Hx   Surg Hx   Fam Hx      Review of Systems:  No other skin or systemic complaints except as noted in HPI or Assessment and Plan.  Objective  Well appearing patient in no apparent distress; mood and affect are within normal limits.  A focused examination was performed including neck. Relevant physical exam findings are noted in the Assessment and Plan.  R lat neck 0.6cm irregular brown macule       neck Small pedunculated waxy paps with erythema   Assessment & Plan  Neoplasm of skin R lat neck Epidermal / dermal shaving  Lesion diameter (cm):  0.6 Informed consent: discussed and consent obtained   Timeout: patient name, date of birth, surgical site, and procedure verified   Procedure prep:  Patient was prepped and draped in usual sterile fashion Prep type:  Isopropyl alcohol Anesthesia: the lesion was anesthetized in a standard fashion   Anesthetic:  1% lidocaine w/ epinephrine 1-100,000 buffered w/ 8.4% NaHCO3 Instrument used: flexible razor blade   Hemostasis achieved with: pressure, aluminum chloride and electrodesiccation   Outcome: patient tolerated procedure well   Post-procedure details: sterile dressing applied and wound care instructions given   Dressing type: bandage and bacitracin    Specimen 1 - Surgical pathology Differential Diagnosis: D48.5 Nevus vs Dysplastic nevus  Check Margins: yes 0.6cm irregular brown macule  Inflamed seborrheic  keratosis neck Pt declines treatment today  Melanocytic Nevi - Tan-brown and/or pink-flesh-colored symmetric macules and papules - Benign appearing on exam today - Observation - Call clinic for new or changing moles - Recommend daily use of broad spectrum spf 30+ sunscreen to sun-exposed areas.   Seborrheic Keratoses - Stuck-on, waxy, tan-brown papules and/or plaques  - Benign-appearing - Discussed benign etiology and prognosis. - Observe - Call for any changes  Actinic Damage - chronic, secondary to cumulative UV radiation exposure/sun exposure over time - diffuse scaly erythematous macules with underlying dyspigmentation - Recommend daily broad spectrum sunscreen SPF 30+ to sun-exposed areas, reapply every 2 hours as needed.  - Recommend staying in the shade or wearing long sleeves, sun glasses (UVA+UVB protection) and wide brim hats (4-inch brim around the entire circumference of the hat). - Call for new or changing lesions.  Return for PRN pending bx results.  I, Othelia Pulling, RMA, am acting as scribe for Sarina Ser, MD . Documentation: I have reviewed the above documentation for accuracy and completeness, and I agree with the above.  Sarina Ser, MD

## 2022-01-27 ENCOUNTER — Other Ambulatory Visit: Payer: Self-pay | Admitting: Family Medicine

## 2022-01-27 DIAGNOSIS — E7800 Pure hypercholesterolemia, unspecified: Secondary | ICD-10-CM

## 2022-01-28 NOTE — Telephone Encounter (Signed)
Requested Prescriptions  Pending Prescriptions Disp Refills   rosuvastatin (CRESTOR) 10 MG tablet [Pharmacy Med Name: ROSUVASTATIN TABS 10MG ] 90 tablet 3    Sig: TAKE 1 TABLET AT BEDTIME     Cardiovascular:  Antilipid - Statins Failed - 01/27/2022  3:06 PM      Failed - Triglycerides in normal range and within 360 days    Triglycerides  Date Value Ref Range Status  11/05/2021 150 (H) <150 mg/dL Final         Passed - Total Cholesterol in normal range and within 360 days    Cholesterol  Date Value Ref Range Status  11/05/2021 165 <200 mg/dL Final         Passed - LDL in normal range and within 360 days    LDL Cholesterol (Calc)  Date Value Ref Range Status  11/05/2021 84 mg/dL (calc) Final    Comment:    Reference range: <100 . Desirable range <100 mg/dL for primary prevention;   <70 mg/dL for patients with CHD or diabetic patients  with > or = 2 CHD risk factors. Marland Kitchen LDL-C is now calculated using the Martin-Hopkins  calculation, which is a validated novel method providing  better accuracy than the Friedewald equation in the  estimation of LDL-C.  Cresenciano Genre et al. Annamaria Helling. 4970;263(78): 2061-2068  (http://education.[facility].com/faq/FAQ164)          Passed - HDL in normal range and within 360 days    HDL  Date Value Ref Range Status  11/05/2021 56 > OR = 40 mg/dL Final         Passed - Patient is not pregnant      Passed - Valid encounter within last 12 months    Recent Outpatient Visits          2 months ago Obesity (BMI 30.0-34.9)   [place], [place]   4 months ago Needs flu shot   [place], [place]   11 months ago Pure hypercholesterolemia   [place], Devonne Doughty, DO   1 year ago Annual physical exam   [facility] Olin Hauser, DO   1 year ago Essential hypertension   [place], [place]      Future Appointments            In 9 months [place] Ranger, Devonne Doughty, DO [facility], [facility]

## 2022-01-29 ENCOUNTER — Telehealth: Payer: Self-pay

## 2022-01-29 DIAGNOSIS — D034 Melanoma in situ of scalp and neck: Secondary | ICD-10-CM

## 2022-01-29 NOTE — Addendum Note (Signed)
Addended by: Harriett Sine on: 01/29/2022 02:00 PM   Modules accepted: Orders

## 2022-01-29 NOTE — Telephone Encounter (Signed)
Called pt scheduled him for TBSE and melanoma matrix 01/30/2022 at 8:15 Referral sent to Dr Lacinda Axon

## 2022-01-30 ENCOUNTER — Other Ambulatory Visit: Payer: Self-pay

## 2022-01-30 ENCOUNTER — Ambulatory Visit: Payer: BC Managed Care – PPO | Admitting: Dermatology

## 2022-01-30 ENCOUNTER — Encounter: Payer: Self-pay | Admitting: Dermatology

## 2022-01-30 DIAGNOSIS — D229 Melanocytic nevi, unspecified: Secondary | ICD-10-CM

## 2022-01-30 DIAGNOSIS — D225 Melanocytic nevi of trunk: Secondary | ICD-10-CM

## 2022-01-30 DIAGNOSIS — L821 Other seborrheic keratosis: Secondary | ICD-10-CM

## 2022-01-30 DIAGNOSIS — L720 Epidermal cyst: Secondary | ICD-10-CM

## 2022-01-30 DIAGNOSIS — D034 Melanoma in situ of scalp and neck: Secondary | ICD-10-CM | POA: Diagnosis not present

## 2022-01-30 DIAGNOSIS — L814 Other melanin hyperpigmentation: Secondary | ICD-10-CM

## 2022-01-30 DIAGNOSIS — Z1283 Encounter for screening for malignant neoplasm of skin: Secondary | ICD-10-CM

## 2022-01-30 DIAGNOSIS — D492 Neoplasm of unspecified behavior of bone, soft tissue, and skin: Secondary | ICD-10-CM

## 2022-01-30 DIAGNOSIS — D239 Other benign neoplasm of skin, unspecified: Secondary | ICD-10-CM

## 2022-01-30 DIAGNOSIS — D2362 Other benign neoplasm of skin of left upper limb, including shoulder: Secondary | ICD-10-CM

## 2022-01-30 DIAGNOSIS — L578 Other skin changes due to chronic exposure to nonionizing radiation: Secondary | ICD-10-CM

## 2022-01-30 DIAGNOSIS — L905 Scar conditions and fibrosis of skin: Secondary | ICD-10-CM | POA: Diagnosis not present

## 2022-01-30 DIAGNOSIS — D1800 Hemangioma unspecified site: Secondary | ICD-10-CM

## 2022-01-30 HISTORY — DX: Other benign neoplasm of skin, unspecified: D23.9

## 2022-01-30 NOTE — Patient Instructions (Addendum)
If You Need Anything After Your Visit ° °If you have any questions or concerns for your doctor, please call our main line at [phone] and press option 4 to reach your doctor's medical assistant. If no one answers, please leave a voicemail as directed and we will return your call as soon as possible. Messages left after 4 pm will be answered the following business day.  ° °You may also send us a message via MyChart. We typically respond to MyChart messages within 1-2 business days. ° °For prescription refills, please ask your pharmacy to contact our office. Our fax number is [phone]. ° °If you have an urgent issue when the clinic is closed that cannot wait until the next business day, you can page your doctor at the number below.   ° °Please note that while we do our best to be available for urgent issues outside of office hours, we are not available 24/7.  ° °If you have an urgent issue and are unable to reach us, you may choose to seek medical care at your doctor's office, retail clinic, urgent care center, or emergency room. ° °If you have a medical emergency, please immediately call 911 or go to the emergency department. ° °Pager Numbers ° °- Dr. Kowalski: [phone] ° °- Dr. Moye: [phone] ° °- Dr. Stewart: [phone] ° °In the event of inclement weather, please call our main line at [phone] for an update on the status of any delays or closures. ° °Dermatology Medication Tips: °Please keep the boxes that topical medications come in in order to help keep track of the instructions about where and how to use these. Pharmacies typically print the medication instructions only on the boxes and not directly on the medication tubes.  ° °If your medication is too expensive, please contact our office at [phone] option 4 or send us a message through MyChart.  ° °We are unable to tell what your co-pay for medications will be in advance as this is different depending on your insurance coverage.  However, we may be able to find a substitute medication at lower cost or fill out paperwork to get insurance to cover a needed medication.  ° °If a prior authorization is required to get your medication covered by your insurance company, please allow us 1-2 business days to complete this process. ° °Drug prices often vary depending on where the prescription is filled and some pharmacies may offer cheaper prices. ° °The website www.goodrx.com contains coupons for medications through different pharmacies. The prices here do not account for what the cost may be with help from insurance (it may be cheaper with your insurance), but the website can give you the price if you did not use any insurance.  °- You can print the associated coupon and take it with your prescription to the pharmacy.  °- You may also stop by our office during regular business hours and pick up a GoodRx coupon card.  °- If you need your prescription sent electronically to a different pharmacy, notify our office through [facility] MyChart or by phone at [phone] option 4. ° ° ° ° °Si Usted Necesita Algo Después de Su Visita ° °También puede enviarnos un mensaje a través de MyChart. Por lo general respondemos a los mensajes de MyChart en el transcurso de 1 a 2 días hábiles. ° °Para renovar recetas, por favor pida a su farmacia que se ponga en contacto con nuestra oficina. Nuestro número de fax es el [phone]. ° °Si tiene   un asunto urgente cuando la clnica est cerrada y que no puede esperar hasta el siguiente da hbil, puede llamar/localizar a su doctor(a) al nmero que aparece a continuacin.   Por favor, tenga en cuenta que aunque hacemos todo lo posible para estar disponibles para asuntos urgentes fuera del horario de [place], no estamos disponibles las 24 horas del da, los 7 das de la [place].   Si tiene un problema urgente y no puede comunicarse con nosotros, puede optar por buscar atencin mdica  en el consultorio de su  doctor(a), en una clnica privada, en un centro de atencin urgente o en una sala de emergencias.  Si tiene Engineering geologist, por favor llame inmediatamente al 911 o vaya a la sala de emergencias.  Nmeros de bper  - Dr. Nehemiah Massed: [phone]  - Dra. Moye: [phone]  - Dra. Nicole Kindred: [phone]  En caso de inclemencias del [place], por favor llame a Johnsie Kindred principal al [phone] para una actualizacin sobre el [place] de cualquier retraso o cierre.  Consejos para la medicacin en dermatologa: Por favor, guarde las cajas en las que vienen los medicamentos de uso tpico para ayudarle a seguir las instrucciones sobre dnde y cmo usarlos. Las farmacias generalmente imprimen las instrucciones del medicamento slo en las cajas y no directamente en los tubos del [place].   Si su medicamento es muy caro, por favor, pngase en contacto con Zigmund Daniel llamando al [phone] y presione la opcin 4 o envenos un mensaje a travs de Pharmacist, community.   No podemos decirle cul ser su copago por los medicamentos por adelantado ya que esto es diferente dependiendo de la cobertura de su seguro. Sin embargo, es posible que podamos encontrar un medicamento sustituto a Electrical engineer un formulario para que el seguro cubra el medicamento que se considera necesario.   Si se requiere una autorizacin previa para que su compaa de seguros [place] su medicamento, por favor permtanos de 1 a 2 das hbiles para completar este proceso.  Los precios de los medicamentos varan con frecuencia dependiendo del Environmental consultant de dnde se surte la receta y alguna farmacias pueden ofrecer precios ms baratos.  El sitio web www.goodrx.com tiene cupones para medicamentos de Airline pilot. Los precios aqu no tienen en cuenta lo que podra costar con la ayuda del seguro (puede ser ms barato con su seguro), pero el sitio web puede darle el precio si no utiliz Research scientist (physical sciences).  - Puede imprimir el cupn  correspondiente y llevarlo con su receta a la farmacia.  - Tambin puede pasar por nuestra oficina durante el horario de atencin regular y Charity fundraiser una tarjeta de cupones de GoodRx.  - Si necesita que su receta se enve electrnicamente a una farmacia diferente, informe a nuestra oficina a travs de MyChart de [place] o por telfono llamando al [phone] y presione la opcin 4.   Wound Care Instructions  Cleanse wound gently with soap and water once a day then pat dry with clean gauze. Apply a thing coat of Petrolatum (petroleum jelly, "Vaseline") over the wound (unless you have an allergy to this). We recommend that you use a new, sterile tube of Vaseline. Do not pick or remove scabs. Do not remove the yellow or white "healing tissue" from the base of the wound.  Cover the wound with fresh, clean, nonstick gauze and secure with paper tape. You may use Band-Aids in place of gauze and tape if the would is small enough, but would recommend trimming much of  the tape off as there is often too much. Sometimes Band-Aids can irritate the skin.  You should call the office for your biopsy report after 1 week if you have not already been contacted.  If you experience any problems, such as abnormal amounts of bleeding, swelling, significant bruising, significant pain, or evidence of infection, please call the office immediately.  FOR ADULT SURGERY PATIENTS: If you need something for pain relief you may take 1 extra strength Tylenol (acetaminophen) AND 2 Ibuprofen (200mg  each) together every 4 hours as needed for pain. (do not take these if you are allergic to them or if you have a reason you should not take them.) Typically, you may only need pain medication for 1 to 3 days.     Melanoma ABCDEs  Melanoma is the most dangerous type of skin cancer, and is the leading cause of death from skin disease.  You are more likely to develop melanoma if you: Have light-colored skin, light-colored eyes, or red  or blond hair Spend a lot of time in the sun Tan regularly, either outdoors or in a tanning bed Have had blistering sunburns, especially during childhood Have a close family member who has had a melanoma Have atypical moles or large birthmarks  Early detection of melanoma is key since treatment is typically straightforward and cure rates are extremely high if we catch it early.   The first sign of melanoma is often a change in a mole or a new dark spot.  The ABCDE system is a way of remembering the signs of melanoma.  A for asymmetry:  The two halves do not match. B for border:  The edges of the growth are irregular. C for color:  A mixture of colors are present instead of an even brown color. D for diameter:  Melanomas are usually (but not always) greater than 45mm - the size of a pencil eraser. E for evolution:  The spot keeps changing in size, shape, and color.  Please check your skin once per month between visits. You can use a small mirror in front and a large mirror behind you to keep an eye on the back side or your body.   If you see any new or changing lesions before your next follow-up, please call to schedule a visit.  Please continue daily skin protection including broad spectrum sunscreen SPF 30+ to sun-exposed areas, reapplying every 2 hours as needed when you're outdoors.   Staying in the shade or wearing long sleeves, sun glasses (UVA+UVB protection) and wide brim hats (4-inch brim around the entire circumference of the hat) are also recommended for sun protection.

## 2022-01-30 NOTE — Progress Notes (Signed)
Follow-Up Visit   Subjective  Vincent Clements is a 61 y.o. male who presents for the following: Total body skin exam and Melanoma IS - [place] Maligna type - biopsy proven (R lat neck, pt is scheduled for [facility] with Dr. Lacinda Axon 05/06/2022). The patient presents for Total-Body Skin Exam (TBSE) for skin cancer screening and mole check.  The patient has spots, moles and lesions to be evaluated, some may be new or changing and the patient has concerns that these could be cancer.  Patient accompanied by wife who contributes to history.  The following portions of the chart were reviewed this encounter and updated as appropriate:   Tobacco   Allergies   Meds   Problems   Med Hx   Surg Hx   Fam Hx      Review of Systems:  No other skin or systemic complaints except as noted in HPI or Assessment and Plan.  Objective  Well appearing patient in no apparent distress; mood and affect are within normal limits.  A full examination was performed including scalp, head, eyes, ears, nose, lips, neck, chest, axillae, abdomen, back, buttocks, bilateral upper extremities, bilateral lower extremities, hands, feet, fingers, toes, fingernails, and toenails. All findings within normal limits unless otherwise noted below.  R lat neck Pink bx site, no lyphadenopathy       L index middle finger web space Firm pink/brown papulenodule with dimple sign.      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 Lacinda Axon for [facility] 05/06/22  Discussed diagnosis in detail including significance of melanoma diagnosis which can be potentially lethal.  Discussed treatment recommendations in detail advising that treatment recommendations are based on longitudinal studies and retrospective  studies and are nationwide protocols.  Advised there is always potential for recurrence even after definitive treatment.  After definitive treatment, we recommend total-body skin exams every 3 months for a year; then every 4 months for a year; then every 6 months for 3 years.  At 5 years post treatment, if all looks good we would recommend at least yearly total-body skin exams for the rest of your life.  The patient was given time for questions and these were answered.  We recommend frequent self skin examinations; photoprotection with sunscreen, sun protective clothing, hats, sunglasses and sun avoidance.  If the patient notices any new or changing skin lesions the patient should return to the office immediately for evaluation.    Dermatofibroma L index middle finger web space Benign-appearing.  Observation.  Call clinic for new or changing lesions.  Recommend daily use of broad spectrum spf 30+ sunscreen to sun-exposed areas.   Neoplasm of skin (2) R sup lat scpula superior Epidermal / dermal shaving  Lesion diameter (cm):  0.6 Informed consent: discussed and consent obtained   Timeout: patient name, date of birth, surgical site, and procedure verified   Procedure prep:  Patient was prepped and draped in usual sterile fashion Prep type:  Isopropyl alcohol Anesthesia: the lesion was anesthetized in a standard fashion   Anesthetic:  1% lidocaine w/ epinephrine 1-100,000 buffered w/ 8.4% NaHCO3 Instrument used: flexible razor blade   Hemostasis achieved with: pressure, aluminum chloride and  electrodesiccation   Outcome: patient tolerated procedure well   Post-procedure details: sterile dressing applied and wound care instructions given   Dressing type: bandage and bacitracin    Specimen 1 - Surgical pathology Differential Diagnosis: D48.5 Nevus vs Dysplastic Nevus Check Margins: yes Irregular brown macule 0.6cm  R sup lat scapula inferior Epidermal / dermal shaving  Lesion diameter  (cm):  0.6 Informed consent: discussed and consent obtained   Timeout: patient name, date of birth, surgical site, and procedure verified   Procedure prep:  Patient was prepped and draped in usual sterile fashion Prep type:  Isopropyl alcohol Anesthesia: the lesion was anesthetized in a standard fashion   Anesthetic:  1% lidocaine w/ epinephrine 1-100,000 buffered w/ 8.4% NaHCO3 Instrument used: flexible razor blade   Hemostasis achieved with: pressure, aluminum chloride and electrodesiccation   Outcome: patient tolerated procedure well   Post-procedure details: sterile dressing applied and wound care instructions given   Dressing type: bandage and bacitracin    Specimen 2 - Surgical pathology Differential Diagnosis: D48.5 Nevus vs Dysplastic Nevus Check Margins: yes Irregular brown macule 0.6cm  Epidermal cyst R low back sacral area Benign-appearing. Exam most consistent with an epidermal inclusion cyst. Discussed that a cyst is a benign growth that can grow over time and sometimes get irritated or inflamed. Recommend observation if it is not bothersome. Discussed option of surgical excision to remove it if it is growing, symptomatic, or other changes noted. Please call for new or changing lesions so they can be evaluated.  Lentigines - Scattered tan macules - Due to sun exposure - Benign-appearing, observe - Recommend daily broad spectrum sunscreen SPF 30+ to sun-exposed areas, reapply every 2 hours as needed. - Call for any changes  Seborrheic Keratoses - Stuck-on, waxy, tan-brown papules and/or plaques  - Benign-appearing - Discussed benign etiology and prognosis. - Observe - Call for any changes - back  Melanocytic Nevi - Tan-brown and/or pink-flesh-colored symmetric macules and papules - Benign appearing on exam today - Observation - Call clinic for new or changing moles - Recommend daily use of broad spectrum spf 30+ sunscreen to sun-exposed areas.  - face, arms,  back  Hemangiomas - Red papules - Discussed benign nature - Observe - Call for any changes  Actinic Damage - Chronic condition, secondary to cumulative UV/sun exposure - diffuse scaly erythematous macules with underlying dyspigmentation - Recommend daily broad spectrum sunscreen SPF 30+ to sun-exposed areas, reapply every 2 hours as needed.  - Staying in the shade or wearing long sleeves, sun glasses (UVA+UVB protection) and wide brim hats (4-inch brim around the entire circumference of the hat) are also recommended for sun protection.  - Call for new or changing lesions.  Skin cancer screening performed today.  Return in about 3 months (around 04/29/2022) for TBSE, Hx of Melanoma.  I, Othelia Pulling, RMA, am acting as scribe for Sarina Ser, MD . Documentation: I have reviewed the above documentation for accuracy and completeness, and I agree with the above.  Sarina Ser, MD

## 2022-02-04 ENCOUNTER — Telehealth: Payer: Self-pay

## 2022-02-04 NOTE — Telephone Encounter (Signed)
Discussed biopsy results with pt  °

## 2022-02-04 NOTE — Telephone Encounter (Signed)
-----   Message from Ralene Bathe, MD sent at 02/02/2022  3:28 PM EST ----- Diagnosis 1. Skin , R sup lat scapula superior DYSPLASTIC NEVUS WITH MODERATE TO SEVERE ATYPIA, CLOSE TO MARGIN, SEE DESCRIPTION 2. Skin , R sup lat scapula inferior DYSPLASTIC NEVUS WITH MODERATE TO SEVERE ATYPIA, WITH SCAR, CLOSE TO MARGIN, SEE DESCRIPTION  1&2 - both Moderate to Severe Dysplastic Both margins clear but "close to margin" May need additional procedure Will recheck and discuss further at next visit in 3 mos.

## 2022-02-20 DIAGNOSIS — G4733 Obstructive sleep apnea (adult) (pediatric): Secondary | ICD-10-CM | POA: Diagnosis not present

## 2022-03-12 ENCOUNTER — Ambulatory Visit: Payer: BC Managed Care – PPO | Admitting: Dermatology

## 2022-03-13 DIAGNOSIS — R001 Bradycardia, unspecified: Secondary | ICD-10-CM | POA: Diagnosis not present

## 2022-03-13 DIAGNOSIS — R079 Chest pain, unspecified: Secondary | ICD-10-CM | POA: Diagnosis not present

## 2022-03-13 DIAGNOSIS — R9439 Abnormal result of other cardiovascular function study: Secondary | ICD-10-CM | POA: Diagnosis not present

## 2022-03-13 DIAGNOSIS — I480 Paroxysmal atrial fibrillation: Secondary | ICD-10-CM | POA: Diagnosis not present

## 2022-03-13 DIAGNOSIS — R931 Abnormal findings on diagnostic imaging of heart and coronary circulation: Secondary | ICD-10-CM | POA: Diagnosis not present

## 2022-03-15 ENCOUNTER — Other Ambulatory Visit: Payer: Self-pay | Admitting: Internal Medicine

## 2022-03-15 DIAGNOSIS — R079 Chest pain, unspecified: Secondary | ICD-10-CM

## 2022-03-19 ENCOUNTER — Encounter (HOSPITAL_COMMUNITY): Payer: Self-pay

## 2022-03-19 ENCOUNTER — Other Ambulatory Visit (HOSPITAL_COMMUNITY): Payer: Self-pay | Admitting: *Deleted

## 2022-03-19 ENCOUNTER — Telehealth (HOSPITAL_COMMUNITY): Payer: Self-pay | Admitting: *Deleted

## 2022-03-19 MED ORDER — METOPROLOL TARTRATE 100 MG PO TABS: ORAL_TABLET | ORAL | 0 refills | Status: DC

## 2022-03-19 NOTE — Telephone Encounter (Signed)
Attempted to call patient regarding upcoming cardiac CT appointment. ?Left message on voicemail with name and callback number ? ?[name] RN Navigator Cardiac Imaging ?[facility] ?[phone] Office ?[phone] Cell ? ?After chart review by Dr. Garen Lah, sending a one-time dose of '100mg'$  metoprolol tartrate for patient to take 2 hours prior to his cardiac CT scan. ?

## 2022-03-19 NOTE — Telephone Encounter (Signed)
Patient returning call regarding upcoming cardiac imaging study; pt verbalizes understanding of appt date/time, parking situation and where to check in, pre-test NPO status and medications ordered, and verified current allergies; name and call back number provided for further questions should they arise ? ?[name] RN Navigator Cardiac Imaging ?[facility] ?[phone] office ?[phone] cell ? ?Patient to take '100mg'$  metoprolol tartrate two hours prior to his cardiac CT scan.  He also has MyChart instructions. ?

## 2022-03-20 DIAGNOSIS — G4733 Obstructive sleep apnea (adult) (pediatric): Secondary | ICD-10-CM | POA: Diagnosis not present

## 2022-03-21 ENCOUNTER — Other Ambulatory Visit: Payer: Self-pay

## 2022-03-21 ENCOUNTER — Ambulatory Visit
Admission: RE | Admit: 2022-03-21 | Discharge: 2022-03-21 | Disposition: A | Discharge status: Home / Self Care | Payer: PRIVATE HEALTH INSURANCE | Source: Ambulatory Visit | Attending: Internal Medicine | Admitting: Internal Medicine

## 2022-03-21 DIAGNOSIS — I2510 Atherosclerotic heart disease of native coronary artery without angina pectoris: Secondary | ICD-10-CM | POA: Diagnosis not present

## 2022-03-21 DIAGNOSIS — R079 Chest pain, unspecified: Secondary | ICD-10-CM | POA: Insufficient documentation

## 2022-03-21 LAB — POCT I-STAT CREATININE: Creatinine, Ser: 0.8 mg/dL (ref 0.61–1.24)

## 2022-03-21 MED ORDER — IOHEXOL 350 MG/ML SOLN
100.0000 mL | Freq: Once | INTRAVENOUS | Status: AC | PRN
  Administered 2022-03-21: 100 mL via INTRAVENOUS

## 2022-03-21 MED ORDER — METOPROLOL TARTRATE 5 MG/5ML IV SOLN
5.0000 mg | Freq: Once | INTRAVENOUS | Status: AC
  Administered 2022-03-21: 5 mg via INTRAVENOUS

## 2022-03-21 MED ORDER — NITROGLYCERIN 0.4 MG SL SUBL
0.8000 mg | SUBLINGUAL_TABLET | Freq: Once | SUBLINGUAL | Status: AC
  Administered 2022-03-21: 0.8 mg via SUBLINGUAL

## 2022-03-21 NOTE — Progress Notes (Signed)
Patient tolerated CT well. Drank water after. Vital signs stable encourage to drink water throughout day.Reasons explained and verbalized understanding. Ambulated steady gait.  

## 2022-04-05 DIAGNOSIS — D034 Melanoma in situ of scalp and neck: Secondary | ICD-10-CM | POA: Diagnosis not present

## 2022-04-15 DIAGNOSIS — R001 Bradycardia, unspecified: Secondary | ICD-10-CM | POA: Diagnosis not present

## 2022-04-15 DIAGNOSIS — I2510 Atherosclerotic heart disease of native coronary artery without angina pectoris: Secondary | ICD-10-CM | POA: Diagnosis not present

## 2022-04-15 DIAGNOSIS — R931 Abnormal findings on diagnostic imaging of heart and coronary circulation: Secondary | ICD-10-CM | POA: Diagnosis not present

## 2022-04-15 DIAGNOSIS — R079 Chest pain, unspecified: Secondary | ICD-10-CM | POA: Diagnosis not present

## 2022-04-17 ENCOUNTER — Telehealth: Payer: Self-pay

## 2022-04-17 NOTE — Telephone Encounter (Signed)
Updated history and specimen tracking from [facility] progress notes/photos.  ?

## 2022-04-20 DIAGNOSIS — G4733 Obstructive sleep apnea (adult) (pediatric): Secondary | ICD-10-CM | POA: Diagnosis not present

## 2022-05-01 ENCOUNTER — Ambulatory Visit: Payer: BC Managed Care – PPO | Admitting: Dermatology

## 2022-05-01 DIAGNOSIS — D492 Neoplasm of unspecified behavior of bone, soft tissue, and skin: Secondary | ICD-10-CM

## 2022-05-01 DIAGNOSIS — L814 Other melanin hyperpigmentation: Secondary | ICD-10-CM | POA: Diagnosis not present

## 2022-05-01 DIAGNOSIS — L821 Other seborrheic keratosis: Secondary | ICD-10-CM

## 2022-05-01 DIAGNOSIS — D225 Melanocytic nevi of trunk: Secondary | ICD-10-CM

## 2022-05-01 DIAGNOSIS — Z1283 Encounter for screening for malignant neoplasm of skin: Secondary | ICD-10-CM

## 2022-05-01 DIAGNOSIS — D229 Melanocytic nevi, unspecified: Secondary | ICD-10-CM

## 2022-05-01 DIAGNOSIS — D2272 Melanocytic nevi of left lower limb, including hip: Secondary | ICD-10-CM

## 2022-05-01 DIAGNOSIS — D1800 Hemangioma unspecified site: Secondary | ICD-10-CM

## 2022-05-01 DIAGNOSIS — L578 Other skin changes due to chronic exposure to nonionizing radiation: Secondary | ICD-10-CM | POA: Diagnosis not present

## 2022-05-01 DIAGNOSIS — Z86006 Personal history of melanoma in-situ: Secondary | ICD-10-CM | POA: Diagnosis not present

## 2022-05-01 DIAGNOSIS — Z86018 Personal history of other benign neoplasm: Secondary | ICD-10-CM

## 2022-05-01 NOTE — Progress Notes (Signed)
? ?Follow-Up Visit ?  ?Subjective  ?Vincent Clements is a 61 y.o. male who presents for the following: Annual Exam (Mole check ). 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 Hx of Dysplastic nevus  ?The patient presents for Total-Body Skin Exam (TBSE) for skin cancer screening and mole check.  The patient has spots, moles and lesions to be evaluated, some may be new or changing and the patient has concerns that these could be cancer.  ? ?The following portions of the chart were reviewed this encounter and updated as appropriate:  ? Tobacco  Allergies  Meds  Problems  Med Hx  Surg Hx  Fam Hx   ?  ?Review of Systems:  No other skin or systemic complaints except as noted in HPI or Assessment and Plan. ? ?Objective  ?Well appearing patient in no apparent distress; mood and affect are within normal limits. ? ?A full examination was performed including scalp, head, eyes, ears, nose, lips, neck, chest, axillae, abdomen, back, buttocks, bilateral upper extremities, bilateral lower extremities, hands, feet, fingers, toes, fingernails, and toenails. All findings within normal limits unless otherwise noted below. ? ?Right pectoral above areola ?0.6 cm irregular brown macule  ? ? ? ? ?Left ant thigh ?0.6 cm irregular brown macule  ? ? ? ? ? ? ?Right sup lat scapula inferior ?Recurrent brown macule 0.2 cm ? ? ? ? ? ? ? ? ? ?Assessment & Plan  ?Neoplasm of skin (3) ?Right pectoral above areola ?Epidermal / dermal shaving ? ?Lesion diameter (cm):  0.6 ?Informed consent: discussed and consent obtained   ?Timeout: patient name, date of birth, surgical site, and procedure verified   ?Procedure prep:  Patient was prepped and draped in usual sterile fashion ?Prep type:  Isopropyl alcohol ?Anesthesia: the lesion was anesthetized in a standard fashion   ?Anesthetic:  1% lidocaine w/ epinephrine 1-100,000 buffered w/ 8.4% NaHCO3 ?Hemostasis achieved with: pressure, aluminum  chloride and electrodesiccation   ?Outcome: patient tolerated procedure well   ?Post-procedure details: sterile dressing applied and wound care instructions given   ?Dressing type: bandage and petrolatum   ? ?Specimen 1 - Surgical pathology ?Differential Diagnosis: R/O Dysplastic nevus ?Check Margins: No ? ?Left ant thigh ?Epidermal / dermal shaving ? ?Lesion diameter (cm):  0.6 ?Informed consent: discussed and consent obtained   ?Timeout: patient name, date of birth, surgical site, and procedure verified   ?Procedure prep:  Patient was prepped and draped in usual sterile fashion ?Prep type:  Isopropyl alcohol ?Anesthesia: the lesion was anesthetized in a standard fashion   ?Anesthetic:  1% lidocaine w/ epinephrine 1-100,000 buffered w/ 8.4% NaHCO3 ?Hemostasis achieved with: pressure, aluminum chloride and electrodesiccation   ?Outcome: patient tolerated procedure well   ?Post-procedure details: sterile dressing applied and wound care instructions given   ?Dressing type: bandage and petrolatum   ? ?Specimen 2 - Surgical pathology ?Differential Diagnosis: R/O Dysplastic nevus  ?Check Margins: No ? ?Right sup lat scapula inferior ?Epidermal / dermal shaving ? ?Lesion diameter (cm):  0.2 ?Informed consent: discussed and consent obtained   ?Timeout: patient name, date of birth, surgical site, and procedure verified   ?Procedure prep:  Patient was prepped and draped in usual sterile fashion ?Prep type:  Isopropyl alcohol ?Anesthesia: the lesion was anesthetized in a standard fashion   ?Anesthetic:  1% lidocaine w/ epinephrine 1-100,000 buffered w/ 8.4% NaHCO3 ?Instrument used: flexible razor blade   ?Hemostasis achieved with: pressure, aluminum  chloride and electrodesiccation   ?Outcome: patient tolerated procedure well   ?Post-procedure details: sterile dressing applied and wound care instructions given   ?Dressing type: bandage and petrolatum   ? ?Specimen 3 - Surgical pathology ?Differential Diagnosis: Recurrent  Dysplastic nevus  ?Check Margins: Yes ?[id number] ? ?Skin cancer screening ? ?Lentigines ?- Scattered tan macules ?- Due to sun exposure ?- Benign-appearing, observe ?- Recommend daily broad spectrum sunscreen SPF 30+ to sun-exposed areas, reapply every 2 hours as needed. ?- Call for any changes ? ?Seborrheic Keratoses ?- Stuck-on, waxy, tan-brown papules and/or plaques  ?- Benign-appearing ?- Discussed benign etiology and prognosis. ?- Observe ?- Call for any changes ? ?Melanocytic Nevi ?- Tan-brown and/or pink-flesh-colored symmetric macules and papules ?- Benign appearing on exam today ?- Observation ?- Call clinic for new or changing moles ?- Recommend daily use of broad spectrum spf 30+ sunscreen to sun-exposed areas.  ? ?Hemangiomas ?- Red papules ?- Discussed benign nature ?- Observe ?- Call for any changes ? ?Actinic Damage ?- Chronic condition, secondary to cumulative UV/sun exposure ?- diffuse scaly erythematous macules with underlying dyspigmentation ?- Recommend daily broad spectrum sunscreen SPF 30+ to sun-exposed areas, reapply every 2 hours as needed.  ?- Staying in the shade or wearing long sleeves, sun glasses (UVA+UVB protection) and wide brim hats (4-inch brim around the entire circumference of the hat) are also recommended for sun protection.  ?- Call for new or changing lesions. ? ?History of Dysplastic Nevi ?Right sup lat scapula superior  ?Right sup lat scapula inferior  ?- No evidence of recurrence today ?- Recommend regular full body skin exams ?- Recommend daily broad spectrum sunscreen SPF 30+ to sun-exposed areas, reapply every 2 hours as needed.  ?- Call if any new or changing lesions are noted between office visits  ? ?History of Melanoma in Situ ?Right lateral neck- Mohs surgery 04/05/2022 ?- No evidence of recurrence today ?- Recommend regular full body skin exams ?- Recommend daily broad spectrum sunscreen SPF 30+ to sun-exposed areas, reapply every 2 hours as needed.  ?- Call if any  new or changing lesions are noted between office visits  ? ?Skin cancer screening performed today.  ? ?Return in about 3 months (around 08/01/2022) for TBSE, hx of Melanoma. ? ?I, [name], CMA, am acting as scribe for Sarina Ser, MD .  ?Documentation: I have reviewed the above documentation for accuracy and completeness, and I agree with the above. ? ?Sarina Ser, MD ? ?

## 2022-05-01 NOTE — Patient Instructions (Addendum)

## 2022-05-06 ENCOUNTER — Telehealth: Payer: Self-pay

## 2022-05-06 NOTE — Telephone Encounter (Signed)
-----   Message from Ralene Bathe, MD sent at 05/02/2022  5:29 PM EDT ----- ?Diagnosis ?1. Skin , right pectoral above areola ?DYSPLASTIC JUNCTIONAL NEVUS WITH MODERATE ATYPIA, CLOSE TO MARGIN ?2. Skin , left ant thigh ?DYSPLASTIC JUNCTIONAL NEVUS WITH MODERATE ATYPIA, INFLAMED, CLOSE TO MARGIN ?3. Skin , right sup lat scapula inferior ?RECURRENT MELANOCYTIC NEVUS, LIMITED MARGINS FREE ? ?1&2 -both Moderate dysplastic ?Recheck next visit ?3 -recurrent dysplastic nevus  ?margins free ?Recheck next visit ?

## 2022-05-06 NOTE — Telephone Encounter (Signed)
Advised pt of bx results/sh ?

## 2022-05-14 ENCOUNTER — Encounter: Payer: Self-pay | Admitting: Dermatology

## 2022-05-20 DIAGNOSIS — G4733 Obstructive sleep apnea (adult) (pediatric): Secondary | ICD-10-CM | POA: Diagnosis not present

## 2022-06-20 DIAGNOSIS — G4733 Obstructive sleep apnea (adult) (pediatric): Secondary | ICD-10-CM | POA: Diagnosis not present

## 2022-08-01 ENCOUNTER — Ambulatory Visit: Payer: BC Managed Care – PPO | Admitting: Dermatology

## 2022-08-01 DIAGNOSIS — L578 Other skin changes due to chronic exposure to nonionizing radiation: Secondary | ICD-10-CM | POA: Diagnosis not present

## 2022-08-01 DIAGNOSIS — Z86018 Personal history of other benign neoplasm: Secondary | ICD-10-CM

## 2022-08-01 DIAGNOSIS — D229 Melanocytic nevi, unspecified: Secondary | ICD-10-CM

## 2022-08-01 DIAGNOSIS — Z86006 Personal history of melanoma in-situ: Secondary | ICD-10-CM

## 2022-08-01 DIAGNOSIS — Z1283 Encounter for screening for malignant neoplasm of skin: Secondary | ICD-10-CM

## 2022-08-01 DIAGNOSIS — L720 Epidermal cyst: Secondary | ICD-10-CM | POA: Diagnosis not present

## 2022-08-01 DIAGNOSIS — D1800 Hemangioma unspecified site: Secondary | ICD-10-CM

## 2022-08-01 DIAGNOSIS — D2271 Melanocytic nevi of right lower limb, including hip: Secondary | ICD-10-CM

## 2022-08-01 DIAGNOSIS — L821 Other seborrheic keratosis: Secondary | ICD-10-CM

## 2022-08-01 DIAGNOSIS — L814 Other melanin hyperpigmentation: Secondary | ICD-10-CM

## 2022-08-01 NOTE — Patient Instructions (Signed)
Due to recent changes in healthcare laws, you may see results of your pathology and/or laboratory studies on MyChart before the doctors have had a chance to review them. We understand that in some cases there may be results that are confusing or concerning to you. Please understand that not all results are received at the same time and often the doctors may need to interpret multiple results in order to provide you with the best plan of care or course of treatment. Therefore, we ask that you please give us 2 business days to thoroughly review all your results before contacting the office for clarification. Should we see a critical lab result, you will be contacted sooner.   If You Need Anything After Your Visit  If you have any questions or concerns for your doctor, please call our main line at [phone] and press option 4 to reach your doctor's medical assistant. If no one answers, please leave a voicemail as directed and we will return your call as soon as possible. Messages left after 4 pm will be answered the following business day.   You may also send us a message via MyChart. We typically respond to MyChart messages within 1-2 business days.  For prescription refills, please ask your pharmacy to contact our office. Our fax number is [phone].  If you have an urgent issue when the clinic is closed that cannot wait until the next business day, you can page your doctor at the number below.    Please note that while we do our best to be available for urgent issues outside of office hours, we are not available 24/7.   If you have an urgent issue and are unable to reach us, you may choose to seek medical care at your doctor's office, retail clinic, urgent care center, or emergency room.  If you have a medical emergency, please immediately call 911 or go to the emergency department.  Pager Numbers  - Dr. Kowalski: [phone]  - Dr. Moye: [phone]  - Dr. Stewart:  [phone]  In the event of inclement weather, please call our main line at [phone] for an update on the status of any delays or closures.  Dermatology Medication Tips: Please keep the boxes that topical medications come in in order to help keep track of the instructions about where and how to use these. Pharmacies typically print the medication instructions only on the boxes and not directly on the medication tubes.   If your medication is too expensive, please contact our office at [phone] option 4 or send us a message through MyChart.   We are unable to tell what your co-pay for medications will be in advance as this is different depending on your insurance coverage. However, we may be able to find a substitute medication at lower cost or fill out paperwork to get insurance to cover a needed medication.   If a prior authorization is required to get your medication covered by your insurance company, please allow us 1-2 business days to complete this process.  Drug prices often vary depending on where the prescription is filled and some pharmacies may offer cheaper prices.  The website www.goodrx.com contains coupons for medications through different pharmacies. The prices here do not account for what the cost may be with help from insurance (it may be cheaper with your insurance), but the website can give you the price if you did not use any insurance.  - You can print the associated coupon and take it with   your prescription to the pharmacy.  - You may also stop by our office during regular business hours and pick up a GoodRx coupon card.  - If you need your prescription sent electronically to a different pharmacy, notify our office through [facility] MyChart or by phone at [phone] option 4.     Si Usted Necesita Algo Despus de Su Visita  Tambin puede enviarnos un mensaje a travs de MyChart. Por lo general respondemos a los mensajes de MyChart en el transcurso de 1 a 2  das hbiles.  Para renovar recetas, por favor pida a su farmacia que se ponga en contacto con nuestra oficina. Nuestro nmero de fax es el [phone].  Si tiene un asunto urgente cuando la clnica est cerrada y que no puede esperar hasta el siguiente da hbil, puede llamar/localizar a su doctor(a) al nmero que aparece a continuacin.   Por favor, tenga en cuenta que aunque hacemos todo lo posible para estar disponibles para asuntos urgentes fuera del horario de oficina, no estamos disponibles las 24 horas del da, los 7 das de la semana.   Si tiene un problema urgente y no puede comunicarse con nosotros, puede optar por buscar atencin mdica  en el consultorio de su doctor(a), en una clnica privada, en un centro de atencin urgente o en una sala de emergencias.  Si tiene una emergencia mdica, por favor llame inmediatamente al 911 o vaya a la sala de emergencias.  Nmeros de bper  - Dr. Kowalski: [phone]  - Dra. Moye: [phone]  - Dra. Stewart: [phone]  En caso de inclemencias del tiempo, por favor llame a nuestra lnea principal al [phone] para una actualizacin sobre el estado de cualquier retraso o cierre.  Consejos para la medicacin en dermatologa: Por favor, guarde las cajas en las que vienen los medicamentos de uso tpico para ayudarle a seguir las instrucciones sobre dnde y cmo usarlos. Las farmacias generalmente imprimen las instrucciones del medicamento slo en las cajas y no directamente en los tubos del medicamento.   Si su medicamento es muy caro, por favor, pngase en contacto con nuestra oficina llamando al [phone] y presione la opcin 4 o envenos un mensaje a travs de MyChart.   No podemos decirle cul ser su copago por los medicamentos por adelantado ya que esto es diferente dependiendo de la cobertura de su seguro. Sin embargo, es posible que podamos encontrar un medicamento sustituto a menor costo o llenar un formulario para que el  seguro cubra el medicamento que se considera necesario.   Si se requiere una autorizacin previa para que su compaa de seguros cubra su medicamento, por favor permtanos de 1 a 2 das hbiles para completar este proceso.  Los precios de los medicamentos varan con frecuencia dependiendo del lugar de dnde se surte la receta y alguna farmacias pueden ofrecer precios ms baratos.  El sitio web www.goodrx.com tiene cupones para medicamentos de diferentes farmacias. Los precios aqu no tienen en cuenta lo que podra costar con la ayuda del seguro (puede ser ms barato con su seguro), pero el sitio web puede darle el precio si no utiliz ningn seguro.  - Puede imprimir el cupn correspondiente y llevarlo con su receta a la farmacia.  - Tambin puede pasar por nuestra oficina durante el horario de atencin regular y recoger una tarjeta de cupones de GoodRx.  - Si necesita que su receta se enve electrnicamente a una farmacia diferente, informe a nuestra oficina a travs de MyChart de [facility]   o por telfono llamando al [phone] y presione la opcin 4.  

## 2022-08-01 NOTE — Progress Notes (Signed)
Follow-Up Visit   Subjective  Vincent Clements is a 61 y.o. male who presents for the following: Annual Exam (Mole check). 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  The patient presents for Total-Body Skin Exam (TBSE) for skin cancer screening and mole check.  The patient has spots, moles and lesions to be evaluated, some may be new or changing and the patient has concerns that these could be cancer.   The following portions of the chart were reviewed this encounter and updated as appropriate:   Tobacco  Allergies  Meds  Problems  Med Hx  Surg Hx  Fam Hx     Review of Systems:  No other skin or systemic complaints except as noted in HPI or Assessment and Plan.  Objective  Well appearing patient in no apparent distress; mood and affect are within normal limits.  A full examination was performed including scalp, head, eyes, ears, nose, lips, neck, chest, axillae, abdomen, back, buttocks, bilateral upper extremities, bilateral lower extremities, hands, feet, fingers, toes, fingernails, and toenails. All findings within normal limits unless otherwise noted below.  right low back 1.1 cm Subcutaneous nodule.   Right Foot - Posterior Tan-brown and/or pink-flesh-colored symmetric macules and papules.         Assessment & Plan  Epidermal inclusion cyst right low back  Cyst with symptoms and/or recent change.  Discussed surgical excision to remove, including resulting scar and possible recurrence.  Patient will schedule for surgery. Pre-op information given.   Nevus Right Foot - Posterior  Benign-appearing.  Observation.  Call clinic for new or changing lesions.  Recommend daily use of broad spectrum spf 30+ sunscreen to sun-exposed areas.     Lentigines - Scattered tan macules - Due to sun exposure - Benign-appearing, observe - Recommend daily broad spectrum sunscreen SPF 30+ to sun-exposed areas, reapply every 2 hours as  needed. - Call for any changes  Seborrheic Keratoses - Stuck-on, waxy, tan-brown papules and/or plaques  - Benign-appearing - Discussed benign etiology and prognosis. - Observe - Call for any changes  Melanocytic Nevi - Tan-brown and/or pink-flesh-colored symmetric macules and papules - Benign appearing on exam today - Observation - Call clinic for new or changing moles - Recommend daily use of broad spectrum spf 30+ sunscreen to sun-exposed areas.   Hemangiomas - Red papules - Discussed benign nature - Observe - Call for any changes  Actinic Damage - Chronic condition, secondary to cumulative UV/sun exposure - diffuse scaly erythematous macules with underlying dyspigmentation - Recommend daily broad spectrum sunscreen SPF 30+ to sun-exposed areas, reapply every 2 hours as needed.  - Staying in the shade or wearing long sleeves, sun glasses (UVA+UVB protection) and wide brim hats (4-inch brim around the entire circumference of the hat) are also recommended for sun protection.  - Call for new or changing lesions.  History of Dysplastic Nevi Multiple see history  - No evidence of recurrence today - Recommend regular full body skin exams - Recommend daily broad spectrum sunscreen SPF 30+ to sun-exposed areas, reapply every 2 hours as needed.  - Call if any new or changing lesions are noted between office visits   History of Melanoma in Situ Right lateral neck, Mohs surgery 04/05/2022 - No evidence of recurrence today - Recommend regular full body skin exams - Recommend daily broad spectrum sunscreen SPF 30+ to sun-exposed areas, reapply every 2 hours as needed.  - Call if any  new or changing lesions are noted between office visits   Skin cancer screening performed today.   Return in about 3 months (around 11/01/2022) for TBSE, hx of Melanoma .  IMarye Clements, CMA, am acting as scribe for Vincent Ser, MD .  Documentation: I have reviewed the above documentation for  accuracy and completeness, and I agree with the above.  Vincent Ser, MD

## 2022-08-04 ENCOUNTER — Encounter: Payer: Self-pay | Admitting: Dermatology

## 2022-09-09 DIAGNOSIS — R931 Abnormal findings on diagnostic imaging of heart and coronary circulation: Secondary | ICD-10-CM | POA: Diagnosis not present

## 2022-09-09 DIAGNOSIS — R079 Chest pain, unspecified: Secondary | ICD-10-CM | POA: Diagnosis not present

## 2022-09-09 DIAGNOSIS — I2510 Atherosclerotic heart disease of native coronary artery without angina pectoris: Secondary | ICD-10-CM | POA: Diagnosis not present

## 2022-09-09 DIAGNOSIS — G4733 Obstructive sleep apnea (adult) (pediatric): Secondary | ICD-10-CM | POA: Diagnosis not present

## 2022-09-09 DIAGNOSIS — R001 Bradycardia, unspecified: Secondary | ICD-10-CM | POA: Diagnosis not present

## 2022-09-17 DIAGNOSIS — D034 Melanoma in situ of scalp and neck: Secondary | ICD-10-CM | POA: Diagnosis not present

## 2022-10-09 DIAGNOSIS — G4733 Obstructive sleep apnea (adult) (pediatric): Secondary | ICD-10-CM | POA: Diagnosis not present

## 2022-11-04 ENCOUNTER — Ambulatory Visit: Payer: BC Managed Care – PPO | Admitting: Dermatology

## 2022-11-08 ENCOUNTER — Other Ambulatory Visit: Payer: Self-pay

## 2022-11-08 DIAGNOSIS — R7309 Other abnormal glucose: Secondary | ICD-10-CM

## 2022-11-08 DIAGNOSIS — E669 Obesity, unspecified: Secondary | ICD-10-CM

## 2022-11-08 DIAGNOSIS — E7800 Pure hypercholesterolemia, unspecified: Secondary | ICD-10-CM

## 2022-11-08 DIAGNOSIS — Z125 Encounter for screening for malignant neoplasm of prostate: Secondary | ICD-10-CM

## 2022-11-08 DIAGNOSIS — Z0000 Encounter for general adult medical examination without abnormal findings: Secondary | ICD-10-CM

## 2022-11-08 DIAGNOSIS — I10 Essential (primary) hypertension: Secondary | ICD-10-CM

## 2022-11-09 DIAGNOSIS — G4733 Obstructive sleep apnea (adult) (pediatric): Secondary | ICD-10-CM | POA: Diagnosis not present

## 2022-11-11 ENCOUNTER — Other Ambulatory Visit: Payer: BC Managed Care – PPO

## 2022-11-11 DIAGNOSIS — I10 Essential (primary) hypertension: Secondary | ICD-10-CM | POA: Diagnosis not present

## 2022-11-11 DIAGNOSIS — E7800 Pure hypercholesterolemia, unspecified: Secondary | ICD-10-CM | POA: Diagnosis not present

## 2022-11-11 DIAGNOSIS — R7309 Other abnormal glucose: Secondary | ICD-10-CM | POA: Diagnosis not present

## 2022-11-11 DIAGNOSIS — Z125 Encounter for screening for malignant neoplasm of prostate: Secondary | ICD-10-CM | POA: Diagnosis not present

## 2022-11-12 LAB — CBC WITH DIFFERENTIAL/PLATELET
Absolute Monocytes: 755 cells/uL (ref 200–950)
Basophils Absolute: 70 cells/uL (ref 0–200)
Basophils Relative: 1.1 %
Eosinophils Absolute: 301 cells/uL (ref 15–500)
Eosinophils Relative: 4.7 %
HCT: 45.5 % (ref 38.5–50.0)
Hemoglobin: 15.6 g/dL (ref 13.2–17.1)
Lymphs Abs: 1094 cells/uL (ref 850–3900)
MCH: 30.5 pg (ref 27.0–33.0)
MCHC: 34.3 g/dL (ref 32.0–36.0)
MCV: 88.9 fL (ref 80.0–100.0)
MPV: 10.8 fL (ref 7.5–12.5)
Monocytes Relative: 11.8 %
Neutro Abs: 4179 cells/uL (ref 1500–7800)
Neutrophils Relative %: 65.3 %
Platelets: 301 10*3/uL (ref 140–400)
RBC: 5.12 10*6/uL (ref 4.20–5.80)
RDW: 12.7 % (ref 11.0–15.0)
Total Lymphocyte: 17.1 %
WBC: 6.4 10*3/uL (ref 3.8–10.8)

## 2022-11-12 LAB — HEMOGLOBIN A1C
Hgb A1c MFr Bld: 5.9 % of total Hgb — ABNORMAL HIGH (ref <5.7)
Mean Plasma Glucose: 123 mg/dL
eAG (mmol/L): 6.8 mmol/L

## 2022-11-12 LAB — COMPREHENSIVE METABOLIC PANEL
AG Ratio: 1.8 (calc) (ref 1.0–2.5)
ALT: 48 U/L — ABNORMAL HIGH (ref 9–46)
AST: 24 U/L (ref 10–35)
Albumin: 4.3 g/dL (ref 3.6–5.1)
Alkaline phosphatase (APISO): 51 U/L (ref 35–144)
BUN: 18 mg/dL (ref 7–25)
CO2: 27 mmol/L (ref 20–32)
Calcium: 9.4 mg/dL (ref 8.6–10.3)
Chloride: 106 mmol/L (ref 98–110)
Creat: 0.7 mg/dL (ref 0.70–1.30)
Globulin: 2.4 g/dL (calc) (ref 1.9–3.7)
Glucose, Bld: 114 mg/dL — ABNORMAL HIGH (ref 65–99)
Potassium: 4.2 mmol/L (ref 3.5–5.3)
Sodium: 141 mmol/L (ref 135–146)
Total Bilirubin: 0.4 mg/dL (ref 0.2–1.2)
Total Protein: 6.7 g/dL (ref 6.1–8.1)

## 2022-11-12 LAB — PSA: PSA: 1.32 ng/mL (ref <=4.00)

## 2022-11-12 LAB — TSH: TSH: 1.17 mIU/L (ref 0.40–4.50)

## 2022-11-15 ENCOUNTER — Ambulatory Visit (INDEPENDENT_AMBULATORY_CARE_PROVIDER_SITE_OTHER): Payer: BC Managed Care – PPO | Admitting: Family Medicine

## 2022-11-15 ENCOUNTER — Other Ambulatory Visit: Payer: Self-pay | Admitting: Family Medicine

## 2022-11-15 ENCOUNTER — Encounter: Payer: Self-pay | Admitting: Family Medicine

## 2022-11-15 VITALS — BP 126/77 | HR 69 | Ht 70.0 in | Wt 248.0 lb

## 2022-11-15 DIAGNOSIS — K219 Gastro-esophageal reflux disease without esophagitis: Secondary | ICD-10-CM

## 2022-11-15 DIAGNOSIS — E559 Vitamin D deficiency, unspecified: Secondary | ICD-10-CM

## 2022-11-15 DIAGNOSIS — E7800 Pure hypercholesterolemia, unspecified: Secondary | ICD-10-CM

## 2022-11-15 DIAGNOSIS — E291 Testicular hypofunction: Secondary | ICD-10-CM

## 2022-11-15 DIAGNOSIS — Z23 Encounter for immunization: Secondary | ICD-10-CM | POA: Diagnosis not present

## 2022-11-15 DIAGNOSIS — I10 Essential (primary) hypertension: Secondary | ICD-10-CM | POA: Diagnosis not present

## 2022-11-15 DIAGNOSIS — R7309 Other abnormal glucose: Secondary | ICD-10-CM

## 2022-11-15 DIAGNOSIS — Z85820 Personal history of malignant melanoma of skin: Secondary | ICD-10-CM

## 2022-11-15 DIAGNOSIS — Z0000 Encounter for general adult medical examination without abnormal findings: Secondary | ICD-10-CM | POA: Diagnosis not present

## 2022-11-15 DIAGNOSIS — R053 Chronic cough: Secondary | ICD-10-CM

## 2022-11-15 DIAGNOSIS — E669 Obesity, unspecified: Secondary | ICD-10-CM

## 2022-11-15 DIAGNOSIS — G4733 Obstructive sleep apnea (adult) (pediatric): Secondary | ICD-10-CM | POA: Diagnosis not present

## 2022-11-15 DIAGNOSIS — E538 Deficiency of other specified B group vitamins: Secondary | ICD-10-CM

## 2022-11-15 DIAGNOSIS — R5383 Other fatigue: Secondary | ICD-10-CM

## 2022-11-15 DIAGNOSIS — I480 Paroxysmal atrial fibrillation: Secondary | ICD-10-CM

## 2022-11-15 DIAGNOSIS — J3089 Other allergic rhinitis: Secondary | ICD-10-CM

## 2022-11-15 MED ORDER — OMEPRAZOLE 40 MG PO CPDR
40.0000 mg | DELAYED_RELEASE_CAPSULE | Freq: Every day | ORAL | 1 refills | Status: DC
Start: 2022-11-15 — End: 2023-06-02

## 2022-11-15 NOTE — Assessment & Plan Note (Signed)
Controlled BP Normal readings outside office OFF ACEi (cough) Complication with Systolic CHF NICM, AFib Followed by [facility] Dr Clayborn Bigness    Plan:  1. Continue Losartan '25mg'$  daily back on ARB, Carvedilol 3.'125mg'$  BID  2. Encourage improved lifestyle - low sodium diet, regular exercise - keep up maintaining weight loss 3. Continue monitor BP outside office, bring readings to next visit, if persistently >140/90 or new symptoms notify office sooner

## 2022-11-15 NOTE — Assessment & Plan Note (Signed)
Well controlled, chronic OSA on CPAP - Good adherence to CPAP nightly - Continue current CPAP therapy, patient seems to be benefiting from therapy  

## 2022-11-15 NOTE — Assessment & Plan Note (Signed)
Stable, without active AFib Followed by Dr Clayborn Bigness On BB

## 2022-11-15 NOTE — Patient Instructions (Addendum)
Thank you for coming to the office today.  Recent Labs    11/11/22 0803  HGBA1C 5.9*   Keep improving lifestyle diet  Start the Omeprazole '40mg'$  daily before breakfast everyday, will adjust at next visit. Hopefully cough is improved.  Future can try Azelastine allergy spray as well  Testosterone, Vitamin B12, Vitamin D, Lipid cholesterol  DUE for FASTING BLOOD WORK (no food or drink after midnight before the lab appointment, only water or coffee without cream/sugar on the morning of)  SCHEDULE "Lab Only" visit in the morning at the clinic for lab draw in 4-6 WEEKS   - Make sure Lab Only appointment is at about 1 week before your next appointment, so that results will be available  For Lab Results, once available within 2-3 days of blood draw, you can can log in to MyChart online to view your results and a brief explanation. Also, we can discuss results at next follow-up visit.   Please schedule a Follow-up Appointment to: Return in about 6 weeks (around 12/27/2022) for 4-6 weeks fasting lab only then 1 week later follow-up Low T / Fatigue results, GERD/Cough.  If you have any other questions or concerns, please feel free to call the office or send a message through [facility] [place]. You may also schedule an earlier appointment if necessary.  Additionally, you may be receiving a survey about your experience at our office within a few days to 1 week by e-mail or mail. We value your feedback.  Nobie Putnam, DO [place]

## 2022-11-15 NOTE — Progress Notes (Signed)
Subjective:    Patient ID: Vincent Clements, male    DOB: [birth year], 61 y.o.   MRN: [id number]  Vincent Clements is a 60 y.o. male presenting on 11/15/2022 for Annual Exam   HPI  Here for Annual Physical and Lab Review.  HYPERLIPIDEMIA: - Reports no concerns. Last lipid panel improved LDL 84 and total cholesterol Currently on Rosuvastatin '10mg'$  nightly, tolerating well without muscle ache myalgia Followed by Cardiology He admits occasional mild headaches for few weeks - these have now resolved   Mild Elevated A1c without dx Pre-Diabetes  Last A1c 5.9 elevation. CBGs: Not checking Meds: never on med Reports good compliance. Tolerating well w/o side-effects Currently not on ACEi/ARB - off losartan Lifestyle: Weight gain 40 lbs  - Diet (goal to increase water intake, goal to limit red meat and improve diet, inc vegetables - limits sugar - Exercise (active but limited exercise at times with knee, good days and bad days) Denies hypoglycemia   OSA, on CPAP - Patient reports prior history of dx OSA and on CPAP since Sept 2022, he had initial sleep study 02/2021, followed by [place]. Mostly doing well with it but occasional nights can wake up early due to mask - Overall doing well more rested during daytime, less daytime sleepiness and fatigue. - Today reports that sleep apnea is well controlled. He uses the CPAP machine every night. Tolerates the machine well, and thinks that sleeps better with it and feels good. No new concerns or symptoms.    CHRONIC HTN: Improved BP overall.  Current Meds - Carvedilol 3.'125mg'$  BID, Losartan '25mg'$  daily Reports good compliance, took meds today. Tolerating well, w/o complaints. Denies CP, dyspnea, HA, edema, dizziness / lightheadedness    Morbid Obesity BMI >35 Weight up 40 lbs, weight gain Admits goal to resume lifestyle changes - Diet (goal to increase water intake, avoiding red meat and improve diet, inc vegetables) - Exercise (active but limited  exercise at times with knee, good days and bad days)   Atrial Fibrillation, Paroxysmal Followed by [facility] Cardiology Dr Clayborn Bigness, followed now yearly, continues med management, remains off ARB, they continued BB He does not feel episodes of afib He had July 2021 ECHOcardiogram. He had Stress Test as well. Episodic chest pains vs heaviness, discussed with Dr Clayborn Bigness, he kept track of episodes, had some episodes in Aug/Sept then has resolved. He attributes it to indigestion and heart burn.  GERD Persistent Cough Dry cough intermittent >1-2 years He says worse at night, non productive He admits heartburn, only taking TUMS AS NEEDED  Fatigue / Low Energy Future check Vitamin D, Vitamin B12, Testosterone    Additional concern   Melanoma, history of R neck mole Followed by [facility] - Dr Nehemiah Massed    Health Maintenance:  Updated Colonoscopy 03/20/21, repeat 7 years, 2029.  PSA 1.32 (previously 0.89-0.97) negative     11/15/2022    8:04 AM 11/12/2021    8:25 AM 11/12/2021    8:22 AM  Depression screen PHQ 2/9  Decreased Interest 1 0 0  Down, Depressed, Hopeless 1 0 0  PHQ - 2 Score 2 0 0  Altered sleeping 1  0  Tired, decreased energy 1  0  Change in appetite 0  0  Feeling bad or failure about yourself  0  0  Trouble concentrating 0  0  Moving slowly or fidgety/restless 0  0  Suicidal thoughts 0  0  PHQ-9 Score 4  0  Difficult doing  work/chores Not difficult at all  Not difficult at all    Past Medical History:  Diagnosis Date   Arthritis    Dysplastic nevus 01/30/2022   R sup lat scapula superior, mod to severe   Dysplastic nevus 01/30/2022   R sup lat scapula inferior, mod to severe, shave removal 05/01/2022   Dysplastic nevus 05/01/2022   Right pectoral above areola, moderate atypia   Dysplastic nevus 05/01/2022   Left ant thigh, moderate atypia   Hyperlipemia    Hypertension    Melanoma in situ ([place]) 01/24/2022   right lateral neck, mohs completed  04/05/2022   Past Surgical History:  Procedure Laterality Date   CHOLECYSTECTOMY  10/06/2006   COLONOSCOPY WITH PROPOFOL N/A 03/17/2018   Procedure: COLONOSCOPY WITH PROPOFOL;  Surgeon: [name], MD;  Location: [facility] ENDOSCOPY;  Service: Gastroenterology;  Laterality: N/A;   COLONOSCOPY WITH PROPOFOL N/A 03/20/2021   Procedure: COLONOSCOPY WITH PROPOFOL;  Surgeon: [name], MD;  Location: [facility] ENDOSCOPY;  Service: Gastroenterology;  Laterality: N/A;   KNEE ARTHROSCOPY Right 02/27/2017   Procedure: right knee arthroscopy, partial medial and lateral menisectomy;  Surgeon: Hessie Knows, MD;  Location: [facility] ORS;  Service: Orthopedics;  Laterality: Right;   Social History   Socioeconomic History   Marital status: Married    Spouse name: Not on file   Number of children: Not on file   Years of education: High School   Highest education level: Not on file  Occupational History   Occupation: Animal nutritionist  Tobacco Use   Smoking status: Never   Smokeless tobacco: Former    Types: Chew, Snuff    Quit date: 12/31/1991   Tobacco comments:    pt quit chewing tobacco 01/11/2006  Vaping Use   Vaping Use: Never used  Substance and Sexual Activity   Alcohol use: Not Currently    Comment: occasionally, 1 per week   Drug use: No   Sexual activity: Not on file  Other Topics Concern   Not on file  Social History Narrative   Not on file   Social Determinants of Health   Financial Resource Strain: Not on file  Food Insecurity: Not on file  Transportation Needs: Not on file  Physical Activity: Not on file  Stress: Not on file  Social Connections: Not on file  Intimate Partner Violence: Not on file   Family History  Problem Relation Age of Onset   Heart disease Mother    Heart failure Mother    Stroke Maternal Grandfather    Current Outpatient Medications on File Prior to Visit  Medication Sig   aspirin EC 81 MG tablet Take 81 mg by mouth at bedtime.    carvedilol (COREG) 3.125  MG tablet Take 3.125 mg by mouth 2 (two) times daily with a meal.   fluticasone (FLONASE) 50 MCG/ACT nasal spray Place 2 sprays into both nostrils daily. Use for 4-6 weeks then stop and use seasonally or as needed.   losartan (COZAAR) 25 MG tablet Take 25 mg by mouth daily.   Multiple Vitamins-Minerals (CENTRUM SILVER 50+MEN PO) Take by mouth.   rosuvastatin (CRESTOR) 10 MG tablet TAKE 1 TABLET AT BEDTIME   No current facility-administered medications on file prior to visit.    Review of Systems  Constitutional:  Negative for activity change, appetite change, chills, diaphoresis, fatigue and fever.  HENT:  Negative for congestion and hearing loss.   Eyes:  Negative for visual disturbance.  Respiratory:  Negative for cough, chest tightness, shortness  of breath and wheezing.   Cardiovascular:  Negative for chest pain, palpitations and leg swelling.  Gastrointestinal:  Negative for abdominal pain, constipation, diarrhea, nausea and vomiting.  Genitourinary:  Negative for dysuria, frequency and hematuria.  Musculoskeletal:  Negative for arthralgias and neck pain.  Skin:  Negative for rash.  Neurological:  Negative for dizziness, weakness, light-headedness, numbness and headaches.  Hematological:  Negative for adenopathy.  Psychiatric/Behavioral:  Negative for behavioral problems, dysphoric mood and sleep disturbance.    Per HPI unless specifically indicated above      Objective:    BP 126/77   Pulse 69   Ht '5\' 10"'$  (1.778 m)   Wt 248 lb (112.5 kg)   SpO2 97%   BMI 35.58 kg/m   Wt Readings from Last 3 Encounters:  11/15/22 248 lb (112.5 kg)  11/12/21 204 lb (92.5 kg)  09/18/21 220 lb (99.8 kg)    Physical Exam Vitals and nursing note reviewed.  Constitutional:      General: He is not in acute distress.    Appearance: He is well-developed. He is not diaphoretic.     Comments: Well-appearing, comfortable, cooperative  HENT:     Head: Normocephalic and atraumatic.  Eyes:      General:        Right eye: No discharge.        Left eye: No discharge.     Conjunctiva/sclera: Conjunctivae normal.     Pupils: Pupils are equal, round, and reactive to light.  Neck:     Thyroid: No thyromegaly.  Cardiovascular:     Rate and Rhythm: Normal rate and regular rhythm.     Pulses: Normal pulses.     Heart sounds: Normal heart sounds. No murmur heard. Pulmonary:     Effort: Pulmonary effort is normal. No respiratory distress.     Breath sounds: Normal breath sounds. No wheezing or rales.  Abdominal:     General: Bowel sounds are normal. There is no distension.     Palpations: Abdomen is soft. There is no mass.     Tenderness: There is no abdominal tenderness.  Musculoskeletal:        General: No tenderness. Normal range of motion.     Cervical back: Normal range of motion and neck supple.     Right lower leg: No edema.     Left lower leg: No edema.     Comments: Upper / Lower Extremities: - Normal muscle tone, strength bilateral upper extremities 5/5, lower extremities 5/5  Lymphadenopathy:     Cervical: No cervical adenopathy.  Skin:    General: Skin is warm and dry.     Findings: No erythema or rash.  Neurological:     Mental Status: He is alert and oriented to person, place, and time.     Comments: Distal sensation intact to light touch all extremities  Psychiatric:        Mood and Affect: Mood normal.        Behavior: Behavior normal.        Thought Content: Thought content normal.     Comments: Well groomed, good eye contact, normal speech and thoughts       Results for orders placed or performed in visit on 11/08/22  PSA  Result Value Ref Range   PSA 1.32 < OR = 4.00 ng/mL  Comprehensive Metabolic Panel (CMET)  Result Value Ref Range   Glucose, Bld 114 (H) 65 - 99 mg/dL   BUN 18 7 - 25  mg/dL   Creat 0.70 0.70 - 1.30 mg/dL   BUN/Creatinine Ratio SEE NOTE: 6 - 22 (calc)   Sodium 141 135 - 146 mmol/L   Potassium 4.2 3.5 - 5.3 mmol/L   Chloride 106  98 - 110 mmol/L   CO2 27 20 - 32 mmol/L   Calcium 9.4 8.6 - 10.3 mg/dL   Total Protein 6.7 6.1 - 8.1 g/dL   Albumin 4.3 3.6 - 5.1 g/dL   Globulin 2.4 1.9 - 3.7 g/dL (calc)   AG Ratio 1.8 1.0 - 2.5 (calc)   Total Bilirubin 0.4 0.2 - 1.2 mg/dL   Alkaline phosphatase (APISO) 51 35 - 144 U/L   AST 24 10 - 35 U/L   ALT 48 (H) 9 - 46 U/L  TSH  Result Value Ref Range   TSH 1.17 0.40 - 4.50 mIU/L  CBC with Differential  Result Value Ref Range   WBC 6.4 3.8 - 10.8 Thousand/uL   RBC 5.12 4.20 - 5.80 Million/uL   Hemoglobin 15.6 13.2 - 17.1 g/dL   HCT 45.5 38.5 - 50.0 %   MCV 88.9 80.0 - 100.0 fL   MCH 30.5 27.0 - 33.0 pg   MCHC 34.3 32.0 - 36.0 g/dL   RDW 12.7 11.0 - 15.0 %   Platelets 301 140 - 400 Thousand/uL   MPV 10.8 7.5 - 12.5 fL   Neutro Abs 4,179 1,500 - 7,800 cells/uL   Lymphs Abs 1,094 850 - 3,900 cells/uL   Absolute Monocytes 755 200 - 950 cells/uL   Eosinophils Absolute 301 15 - 500 cells/uL   Basophils Absolute 70 0 - 200 cells/uL   Neutrophils Relative % 65.3 %   Total Lymphocyte 17.1 %   Monocytes Relative 11.8 %   Eosinophils Relative 4.7 %   Basophils Relative 1.1 %  HgB A1c  Result Value Ref Range   Hgb A1c MFr Bld 5.9 (H) <5.7 % of total Hgb   Mean Plasma Glucose 123 mg/dL   eAG (mmol/L) 6.8 mmol/L      Assessment & Plan:   Problem List Items Addressed This Visit     Allergic rhinitis due to allergen   Elevated hemoglobin A1c    Elevated A1c 5.9, prior low 5 range Controlled HTN.  Plan:  1. Not on any therapy currently  2. Encourage improved lifestyle - low carb, low sugar diet, reduce portion size, continue improving regular exercise      Essential hypertension    Controlled BP Normal readings outside office OFF ACEi (cough) Complication with Systolic CHF NICM, AFib Followed by [facility] Cardiology Dr Clayborn Bigness    Plan:  1. Continue Losartan '25mg'$  daily back on ARB, Carvedilol 3.'125mg'$  BID  2. Encourage improved lifestyle - low sodium diet, regular  exercise - keep up maintaining weight loss 3. Continue monitor BP outside office, bring readings to next visit, if persistently >140/90 or new symptoms notify office sooner      Relevant Medications   carvedilol (COREG) 3.125 MG tablet   Obesity (BMI 30.0-34.9)   OSA on CPAP    Well controlled, chronic OSA on CPAP - Good adherence to CPAP nightly - Continue current CPAP therapy, patient seems to be benefiting from therapy       Paroxysmal atrial fibrillation (HCC)    Stable, without active AFib Followed by Dr Clayborn Bigness On BB      Relevant Medications   carvedilol (COREG) 3.125 MG tablet   Pure hypercholesterolemia    Controlled The 10-year ASCVD risk score (  Arnett DK, et al., 2019) is: 6.9%  Plan: 1. Continue Rosuvastatin '10mg'$  nightly 2. Continue ASA '81mg'$  for primary ASCVD risk reduction - discuss with Cardiology Dr Clayborn Bigness may reduce or stop 3. Encourage improved lifestyle - low carb/cholesterol, reduce portion size, continue improving regular exercise      Relevant Medications   carvedilol (COREG) 3.125 MG tablet   Other Visit Diagnoses     Annual physical exam    -  Primary   Needs flu shot       Relevant Orders   Flu Vaccine QUAD 6+ mos PF IM (Fluarix Quad PF) (Completed)   Persistent cough       Gastroesophageal reflux disease without esophagitis       Relevant Medications   omeprazole (PRILOSEC) 40 MG capsule   History of melanoma           Updated Health Maintenance information Reviewed recent lab results with patient Encouraged improvement to lifestyle with diet and exercise Goal of weight loss  Keep improving lifestyle diet  Persistent cough Consider GERD as etiology Uncontrolled Start the Omeprazole '40mg'$  daily before breakfast everyday, will adjust at next visit. Consider Azelastine allergy spray for post nasal if need. Failed Flonase Future reconsider CXR if indicated  Fatigue Future testing - Testosterone, Vitamin B12, Vitamin D, Lipid  cholesterol   Orders Placed This Encounter  Procedures   Flu Vaccine QUAD 6+ mos PF IM (Fluarix Quad PF)      Meds ordered this encounter  Medications   omeprazole (PRILOSEC) 40 MG capsule    Sig: Take 1 capsule (40 mg total) by mouth daily before breakfast.    Dispense:  90 capsule    Refill:  1     Follow up plan: Return in about 6 weeks (around 12/27/2022) for 4-6 weeks fasting lab only then 1 week later follow-up Low T / Fatigue results, GERD/Cough.   Fatigue / Low Energy Future check Vitamin D, Vitamin B12, Testosterone, Lipid   Nobie Putnam, DO [facility] 11/15/2022, 8:15 AM

## 2022-11-15 NOTE — Assessment & Plan Note (Signed)
Controlled The 10-year ASCVD risk score (Arnett DK, et al., 2019) is: 6.9%  Plan: 1. Continue Rosuvastatin '10mg'$  nightly 2. Continue ASA '81mg'$  for primary ASCVD risk reduction - discuss with Cardiology Dr Clayborn Bigness may reduce or stop 3. Encourage improved lifestyle - low carb/cholesterol, reduce portion size, continue improving regular exercise

## 2022-11-15 NOTE — Assessment & Plan Note (Signed)
Elevated A1c 5.9, prior low 5 range Controlled HTN.  Plan:  1. Not on any therapy currently  2. Encourage improved lifestyle - low carb, low sugar diet, reduce portion size, continue improving regular exercise

## 2022-12-05 ENCOUNTER — Ambulatory Visit: Payer: BC Managed Care – PPO | Admitting: Dermatology

## 2022-12-05 DIAGNOSIS — Z86006 Personal history of melanoma in-situ: Secondary | ICD-10-CM | POA: Diagnosis not present

## 2022-12-05 DIAGNOSIS — Z86018 Personal history of other benign neoplasm: Secondary | ICD-10-CM

## 2022-12-05 DIAGNOSIS — D225 Melanocytic nevi of trunk: Secondary | ICD-10-CM | POA: Diagnosis not present

## 2022-12-05 DIAGNOSIS — D229 Melanocytic nevi, unspecified: Secondary | ICD-10-CM

## 2022-12-05 DIAGNOSIS — L578 Other skin changes due to chronic exposure to nonionizing radiation: Secondary | ICD-10-CM | POA: Diagnosis not present

## 2022-12-05 DIAGNOSIS — Z85820 Personal history of malignant melanoma of skin: Secondary | ICD-10-CM

## 2022-12-05 DIAGNOSIS — L814 Other melanin hyperpigmentation: Secondary | ICD-10-CM | POA: Diagnosis not present

## 2022-12-05 DIAGNOSIS — D492 Neoplasm of unspecified behavior of bone, soft tissue, and skin: Secondary | ICD-10-CM

## 2022-12-05 DIAGNOSIS — Z1283 Encounter for screening for malignant neoplasm of skin: Secondary | ICD-10-CM | POA: Diagnosis not present

## 2022-12-05 DIAGNOSIS — L821 Other seborrheic keratosis: Secondary | ICD-10-CM

## 2022-12-05 NOTE — Patient Instructions (Signed)
Due to recent changes in healthcare laws, you may see results of your pathology and/or laboratory studies on MyChart before the doctors have had a chance to review them. We understand that in some cases there may be results that are confusing or concerning to you. Please understand that not all results are received at the same time and often the doctors may need to interpret multiple results in order to provide you with the best plan of care or course of treatment. Therefore, we ask that you please give us 2 business days to thoroughly review all your results before contacting the office for clarification. Should we see a critical lab result, you will be contacted sooner.   If You Need Anything After Your Visit  If you have any questions or concerns for your doctor, please call our main line at [phone] and press option 4 to reach your doctor's medical assistant. If no one answers, please leave a voicemail as directed and we will return your call as soon as possible. Messages left after 4 pm will be answered the following business day.   You may also send us a message via MyChart. We typically respond to MyChart messages within 1-2 business days.  For prescription refills, please ask your pharmacy to contact our office. Our fax number is [phone].  If you have an urgent issue when the clinic is closed that cannot wait until the next business day, you can page your doctor at the number below.    Please note that while we do our best to be available for urgent issues outside of office hours, we are not available 24/7.   If you have an urgent issue and are unable to reach us, you may choose to seek medical care at your doctor's office, retail clinic, urgent care center, or emergency room.  If you have a medical emergency, please immediately call 911 or go to the emergency department.  Pager Numbers  - Dr. Kowalski: [phone]  - Dr. Moye: [phone]  - Dr. Stewart:  [phone]  In the event of inclement weather, please call our main line at [phone] for an update on the status of any delays or closures.  Dermatology Medication Tips: Please keep the boxes that topical medications come in in order to help keep track of the instructions about where and how to use these. Pharmacies typically print the medication instructions only on the boxes and not directly on the medication tubes.   If your medication is too expensive, please contact our office at [phone] option 4 or send us a message through MyChart.   We are unable to tell what your co-pay for medications will be in advance as this is different depending on your insurance coverage. However, we may be able to find a substitute medication at lower cost or fill out paperwork to get insurance to cover a needed medication.   If a prior authorization is required to get your medication covered by your insurance company, please allow us 1-2 business days to complete this process.  Drug prices often vary depending on where the prescription is filled and some pharmacies may offer cheaper prices.  The website www.goodrx.com contains coupons for medications through different pharmacies. The prices here do not account for what the cost may be with help from insurance (it may be cheaper with your insurance), but the website can give you the price if you did not use any insurance.  - You can print the associated coupon and take it with   your prescription to the pharmacy.  - You may also stop by our office during regular business hours and pick up a GoodRx coupon card.  - If you need your prescription sent electronically to a different pharmacy, notify our office through [facility] MyChart or by phone at [phone] option 4.     Si Usted Necesita Algo Despus de Su Visita  Tambin puede enviarnos un mensaje a travs de MyChart. Por lo general respondemos a los mensajes de MyChart en el transcurso de 1 a 2  das hbiles.  Para renovar recetas, por favor pida a su farmacia que se ponga en contacto con nuestra oficina. Nuestro nmero de fax es el [phone].  Si tiene un asunto urgente cuando la clnica est cerrada y que no puede esperar hasta el siguiente da hbil, puede llamar/localizar a su doctor(a) al nmero que aparece a continuacin.   Por favor, tenga en cuenta que aunque hacemos todo lo posible para estar disponibles para asuntos urgentes fuera del horario de oficina, no estamos disponibles las 24 horas del da, los 7 das de la semana.   Si tiene un problema urgente y no puede comunicarse con nosotros, puede optar por buscar atencin mdica  en el consultorio de su doctor(a), en una clnica privada, en un centro de atencin urgente o en una sala de emergencias.  Si tiene una emergencia mdica, por favor llame inmediatamente al 911 o vaya a la sala de emergencias.  Nmeros de bper  - Dr. Kowalski: [phone]  - Dra. Moye: [phone]  - Dra. Stewart: [phone]  En caso de inclemencias del tiempo, por favor llame a nuestra lnea principal al [phone] para una actualizacin sobre el estado de cualquier retraso o cierre.  Consejos para la medicacin en dermatologa: Por favor, guarde las cajas en las que vienen los medicamentos de uso tpico para ayudarle a seguir las instrucciones sobre dnde y cmo usarlos. Las farmacias generalmente imprimen las instrucciones del medicamento slo en las cajas y no directamente en los tubos del medicamento.   Si su medicamento es muy caro, por favor, pngase en contacto con nuestra oficina llamando al [phone] y presione la opcin 4 o envenos un mensaje a travs de MyChart.   No podemos decirle cul ser su copago por los medicamentos por adelantado ya que esto es diferente dependiendo de la cobertura de su seguro. Sin embargo, es posible que podamos encontrar un medicamento sustituto a menor costo o llenar un formulario para que el  seguro cubra el medicamento que se considera necesario.   Si se requiere una autorizacin previa para que su compaa de seguros cubra su medicamento, por favor permtanos de 1 a 2 das hbiles para completar este proceso.  Los precios de los medicamentos varan con frecuencia dependiendo del lugar de dnde se surte la receta y alguna farmacias pueden ofrecer precios ms baratos.  El sitio web www.goodrx.com tiene cupones para medicamentos de diferentes farmacias. Los precios aqu no tienen en cuenta lo que podra costar con la ayuda del seguro (puede ser ms barato con su seguro), pero el sitio web puede darle el precio si no utiliz ningn seguro.  - Puede imprimir el cupn correspondiente y llevarlo con su receta a la farmacia.  - Tambin puede pasar por nuestra oficina durante el horario de atencin regular y recoger una tarjeta de cupones de GoodRx.  - Si necesita que su receta se enve electrnicamente a una farmacia diferente, informe a nuestra oficina a travs de MyChart de [facility]   o por telfono llamando al [phone] y presione la opcin 4.  

## 2022-12-05 NOTE — Progress Notes (Signed)
Follow-Up Visit   Subjective  Vincent Clements is a 61 y.o. male who presents for the following: Annual Exam (The patient presents for Total-Body Skin Exam (TBSE) for skin cancer screening and mole check.  The patient has spots, moles and lesions to be evaluated, some may be new or changing and the patient has concerns that these could be cancer. 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  The following portions of the chart were reviewed this encounter and updated as appropriate:   Tobacco  Allergies  Meds  Problems  Med Hx  Surg Hx  Fam Hx     Review of Systems:  No other skin or systemic complaints except as noted in HPI or Assessment and Plan.  Objective  Well appearing patient in no apparent distress; mood and affect are within normal limits.  A full examination was performed including scalp, head, eyes, ears, nose, lips, neck, chest, axillae, abdomen, back, buttocks, bilateral upper extremities, bilateral lower extremities, hands, feet, fingers, toes, fingernails, and toenails. All findings within normal limits unless otherwise noted below.  left sup scapula medial 0.3 cm dark brown irregular macule        Left sup scapula lateral 0.6 cm dark brown irregular macule       Assessment & Plan  Neoplasm of skin (2) left sup scapula medial Epidermal / dermal shaving  Lesion diameter (cm):  0.3 Informed consent: discussed and consent obtained   Timeout: patient name, date of birth, surgical site, and procedure verified   Patient was prepped and draped in usual sterile fashion: area prepped with alcohol. Anesthesia: the lesion was anesthetized in a standard fashion   Anesthetic:  1% lidocaine w/ epinephrine 1-100,000 local infiltration Instrument used: flexible razor blade   Hemostasis achieved with: pressure, aluminum chloride and electrodesiccation   Outcome: patient tolerated procedure well   Post-procedure details: wound care  instructions given   Post-procedure details comment:  Ointment and a small bandage applied  Specimen 1 - Surgical pathology Differential Diagnosis: R/O Dysplastic nevus vs other Check Margins: No  Left sup scapula lateral Epidermal / dermal shaving  Lesion diameter (cm):  0.6 Informed consent: discussed and consent obtained   Patient was prepped and draped in usual sterile fashion: area prepped with alcohol. Anesthesia: the lesion was anesthetized in a standard fashion   Anesthetic:  1% lidocaine w/ epinephrine 1-100,000 buffered w/ 8.4% NaHCO3 Instrument used: flexible razor blade   Hemostasis achieved with: pressure, aluminum chloride and electrodesiccation   Outcome: patient tolerated procedure well   Post-procedure details: wound care instructions given   Post-procedure details comment:  Ointment and small bandage applied  Specimen 2 - Surgical pathology Differential Diagnosis: R/O Dysplastic nevus vs other Check Margins: No  Lentigines - Scattered tan macules - Due to sun exposure - Benign-appearing, observe - Recommend daily broad spectrum sunscreen SPF 30+ to sun-exposed areas, reapply every 2 hours as needed. - Call for any changes  Seborrheic Keratoses - Stuck-on, waxy, tan-brown papules and/or plaques  - Benign-appearing - Discussed benign etiology and prognosis. - Observe - Call for any changes  Melanocytic Nevi - Tan-brown and/or pink-flesh-colored symmetric macules and papules - Benign appearing on exam today - Observation - Call clinic for new or changing moles - Recommend daily use of broad spectrum spf 30+ sunscreen to sun-exposed areas.   Hemangiomas - Red papules - Discussed benign nature - Observe - Call for any changes  Actinic Damage - Chronic condition, secondary  to cumulative UV/sun exposure - diffuse scaly erythematous macules with underlying dyspigmentation - Recommend daily broad spectrum sunscreen SPF 30+ to sun-exposed areas, reapply  every 2 hours as needed.  - Staying in the shade or wearing long sleeves, sun glasses (UVA+UVB protection) and wide brim hats (4-inch brim around the entire circumference of the hat) are also recommended for sun protection.  - Call for new or changing lesions.  History of Dysplastic Nevi Multiple see history  - No evidence of recurrence today - Recommend regular full body skin exams - Recommend daily broad spectrum sunscreen SPF 30+ to sun-exposed areas, reapply every 2 hours as needed.  - Call if any new or changing lesions are noted between office visits   History of Melanoma in Situ Right lateral neck- mohs surgery 04/05/2022 - No evidence of recurrence today - Recommend regular full body skin exams - Recommend daily broad spectrum sunscreen SPF 30+ to sun-exposed areas, reapply every 2 hours as needed.  - Call if any new or changing lesions are noted between office visits   Skin cancer screening performed today.   Return in about 4 months (around 04/06/2023) for TBSE, hx of Melanoma in situ .  IMarye Round, CMA, am acting as scribe for Sarina Ser, MD .  Documentation: I have reviewed the above documentation for accuracy and completeness, and I agree with the above.  Sarina Ser, MD

## 2022-12-13 DIAGNOSIS — G4733 Obstructive sleep apnea (adult) (pediatric): Secondary | ICD-10-CM | POA: Diagnosis not present

## 2022-12-15 ENCOUNTER — Encounter: Payer: Self-pay | Admitting: Dermatology

## 2022-12-17 ENCOUNTER — Telehealth: Payer: Self-pay

## 2022-12-17 NOTE — Telephone Encounter (Signed)
Patient informed of pathology results 

## 2022-12-17 NOTE — Telephone Encounter (Signed)
-----   Message from Ralene Bathe, MD sent at 12/14/2022  2:00 PM EST ----- Diagnosis 1. Skin , left sup scapula medial DYSPLASTIC JUNCTIONAL LENTIGINOUS NEVUS WITH MODERATE ATYPIA, CLOSE TO MARGIN 2. Skin , left sup scapula lateral DYSPLASTIC JUNCTIONAL NEVUS WITH MODERATE ATYPIA, CLOSE TO MARGIN  1&2 - both Moderate Dysplastic Recheck next visit

## 2022-12-20 ENCOUNTER — Other Ambulatory Visit: Payer: Self-pay

## 2022-12-20 DIAGNOSIS — E559 Vitamin D deficiency, unspecified: Secondary | ICD-10-CM

## 2022-12-20 DIAGNOSIS — E7800 Pure hypercholesterolemia, unspecified: Secondary | ICD-10-CM

## 2022-12-20 DIAGNOSIS — R5383 Other fatigue: Secondary | ICD-10-CM

## 2022-12-20 DIAGNOSIS — E291 Testicular hypofunction: Secondary | ICD-10-CM

## 2022-12-20 DIAGNOSIS — E538 Deficiency of other specified B group vitamins: Secondary | ICD-10-CM

## 2022-12-24 ENCOUNTER — Other Ambulatory Visit: Payer: BC Managed Care – PPO

## 2022-12-24 DIAGNOSIS — E291 Testicular hypofunction: Secondary | ICD-10-CM | POA: Diagnosis not present

## 2022-12-24 DIAGNOSIS — E559 Vitamin D deficiency, unspecified: Secondary | ICD-10-CM | POA: Diagnosis not present

## 2022-12-24 DIAGNOSIS — E7800 Pure hypercholesterolemia, unspecified: Secondary | ICD-10-CM | POA: Diagnosis not present

## 2022-12-24 DIAGNOSIS — R5383 Other fatigue: Secondary | ICD-10-CM | POA: Diagnosis not present

## 2022-12-25 LAB — TSH: TSH: 0.95 mIU/L (ref 0.40–4.50)

## 2022-12-25 LAB — VITAMIN B12: Vitamin B-12: 353 pg/mL (ref 200–1100)

## 2022-12-25 LAB — T4, FREE: Free T4: 1.2 ng/dL (ref 0.8–1.8)

## 2022-12-25 LAB — TESTOSTERONE: Testosterone: 490 ng/dL (ref 250–827)

## 2022-12-25 LAB — LIPID PANEL
Cholesterol: 143 mg/dL (ref <200)
HDL: 42 mg/dL (ref >=40)
LDL Cholesterol (Calc): 80 mg/dL (calc)
Non-HDL Cholesterol (Calc): 101 mg/dL (calc) (ref <130)
Total CHOL/HDL Ratio: 3.4 (calc) (ref <5.0)
Triglycerides: 117 mg/dL (ref <150)

## 2022-12-25 LAB — VITAMIN D 25 HYDROXY (VIT D DEFICIENCY, FRACTURES): Vit D, 25-Hydroxy: 23 ng/mL — ABNORMAL LOW (ref 30–100)

## 2022-12-31 ENCOUNTER — Ambulatory Visit (INDEPENDENT_AMBULATORY_CARE_PROVIDER_SITE_OTHER): Payer: BC Managed Care – PPO | Admitting: Family Medicine

## 2022-12-31 ENCOUNTER — Encounter: Payer: Self-pay | Admitting: Family Medicine

## 2022-12-31 VITALS — BP 132/74 | HR 70 | Ht 70.0 in | Wt 244.0 lb

## 2022-12-31 DIAGNOSIS — E538 Deficiency of other specified B group vitamins: Secondary | ICD-10-CM

## 2022-12-31 DIAGNOSIS — I10 Essential (primary) hypertension: Secondary | ICD-10-CM

## 2022-12-31 DIAGNOSIS — R5383 Other fatigue: Secondary | ICD-10-CM | POA: Diagnosis not present

## 2022-12-31 DIAGNOSIS — R7309 Other abnormal glucose: Secondary | ICD-10-CM

## 2022-12-31 DIAGNOSIS — E7800 Pure hypercholesterolemia, unspecified: Secondary | ICD-10-CM

## 2022-12-31 DIAGNOSIS — K219 Gastro-esophageal reflux disease without esophagitis: Secondary | ICD-10-CM

## 2022-12-31 DIAGNOSIS — E559 Vitamin D deficiency, unspecified: Secondary | ICD-10-CM | POA: Diagnosis not present

## 2022-12-31 DIAGNOSIS — G4733 Obstructive sleep apnea (adult) (pediatric): Secondary | ICD-10-CM

## 2022-12-31 NOTE — Assessment & Plan Note (Signed)
Improved GERD, Heartburn, Chronic Cough/night now controlled on PPI Omeprazole '40mg'$  daily

## 2022-12-31 NOTE — Assessment & Plan Note (Signed)
Well controlled, chronic OSA on CPAP - Good adherence to CPAP nightly - Continue current CPAP therapy, patient seems to be benefiting from therapy  

## 2022-12-31 NOTE — Assessment & Plan Note (Signed)
Controlled, improved on lab The 10-year ASCVD risk score (Arnett DK, et al., 2019) is: 8.1%  Plan: 1. Continue Rosuvastatin '10mg'$  nightly 2. Continue ASA '81mg'$  for primary ASCVD risk reduction - discuss with Cardiology Dr Clayborn Bigness may reduce or stop 3. Encourage improved lifestyle - low carb/cholesterol, reduce portion size, continue improving regular exercise

## 2022-12-31 NOTE — Progress Notes (Signed)
Subjective:    Patient ID: Vincent Clements, male    DOB: 1961-08-28, 62 y.o.   MRN: [id number]  Vincent Clements is a 62 y.o. male presenting on 12/31/2022 for Gastroesophageal Reflux   HPI  GERD Persistent Cough Last visit discussed GERD and Dry cough intermittent >1-2 years, He says worse at night, non productive, worst after eating. He was started on PPI therapy with Omeprazole '40mg'$  daily He is doing very well, with reduced GERD, Reduced cough. No more using TUMs Doing well still not 100% resolved. Improved diet as well  HYPERLIPIDEMIA: - Reports no concerns. Last lipid panel 11/2022, controlled  - Currently taking Rosuvastatin, tolerating well without side effects or myalgias  Vitamin D Deficiency Last lab Vit D 23, slightly lower On MVI 700iu   Fatigue / Low Energy  Vitamin B12 Lab 353, on MVI  Mild Elevated A1c without dx Pre-Diabetes  Last A1c 5.9 elevation. CBGs: Not checking Meds: never on med Reports good compliance. Tolerating well w/o side-effects Currently not on ACEi/ARB - off losartan Lifestyle: Weight loss 4 lbs in 2 months - Diet (goal to increase water intake, goal to limit red meat and improve diet, inc vegetables - limits sugar - Exercise (active but limited exercise at times with knee, good days and bad days) Denies hypoglycemia   OSA, on CPAP - Patient reports prior history of dx OSA and on CPAP since Sept 2022, he had initial sleep study 02/2021, followed by [facility]. Mostly doing well with it but occasional nights can wake up early due to mask - Overall doing well more rested during daytime, less daytime sleepiness and fatigue. - Today reports that sleep apnea is well controlled. He uses the CPAP machine every night. Tolerates the machine well, and thinks that sleeps better with it and feels good. No new concerns or symptoms.    CHRONIC HTN: Improved BP overall.  Current Meds - Carvedilol 3.'125mg'$  BID, Losartan '25mg'$  daily Reports good compliance, took  meds today. Tolerating well, w/o complaints. Denies CP, dyspnea, HA, edema, dizziness / lightheadedness      12/31/2022    8:09 AM 11/15/2022    8:04 AM 11/12/2021    8:25 AM  Depression screen PHQ 2/9  Decreased Interest 0 1 0  Down, Depressed, Hopeless 0 1 0  PHQ - 2 Score 0 2 0  Altered sleeping 1 1   Tired, decreased energy 1 1   Change in appetite 0 0   Feeling bad or failure about yourself  0 0   Trouble concentrating 0 0   Moving slowly or fidgety/restless 0 0   Suicidal thoughts 0 0   PHQ-9 Score 2 4   Difficult doing work/chores Not difficult at all Not difficult at all     Social History   Tobacco Use   Smoking status: Never   Smokeless tobacco: Former    Types: Chew, Snuff    Quit date: 12/31/1991   Tobacco comments:    pt quit chewing tobacco 01/11/2006  Vaping Use   Vaping Use: Never used  Substance Use Topics   Alcohol use: Not Currently    Comment: occasionally, 1 per week   Drug use: No    Review of Systems Per HPI unless specifically indicated above     Objective:    BP 132/74   Pulse 70   Ht '5\' 10"'$  (1.778 m)   Wt 244 lb (110.7 kg)   SpO2 96%   BMI 35.01 kg/m  Wt Readings from Last 3 Encounters:  12/31/22 244 lb (110.7 kg)  11/15/22 248 lb (112.5 kg)  11/12/21 204 lb (92.5 kg)    Physical Exam Vitals and nursing note reviewed.  Constitutional:      General: He is not in acute distress.    Appearance: Normal appearance. He is well-developed. He is not diaphoretic.     Comments: Well-appearing, comfortable, cooperative  HENT:     Head: Normocephalic and atraumatic.  Eyes:     General:        Right eye: No discharge.        Left eye: No discharge.     Conjunctiva/sclera: Conjunctivae normal.  Cardiovascular:     Rate and Rhythm: Normal rate.  Pulmonary:     Effort: Pulmonary effort is normal.  Skin:    General: Skin is warm and dry.     Findings: No erythema or rash.  Neurological:     Mental Status: He is alert and oriented  to person, place, and time.  Psychiatric:        Mood and Affect: Mood normal.        Behavior: Behavior normal.        Thought Content: Thought content normal.     Comments: Well groomed, good eye contact, normal speech and thoughts    Results for orders placed or performed in visit on 12/20/22  Lipid panel  Result Value Ref Range   Cholesterol 143 <200 mg/dL   HDL 42 > OR = 40 mg/dL   Triglycerides 117 <150 mg/dL   LDL Cholesterol (Calc) 80 mg/dL (calc)   Total CHOL/HDL Ratio 3.4 <5.0 (calc)   Non-HDL Cholesterol (Calc) 101 <130 mg/dL (calc)  VITAMIN D 25 Hydroxy (Vit-D Deficiency, Fractures)  Result Value Ref Range   Vit D, 25-Hydroxy 23 (L) 30 - 100 ng/mL  Testosterone  Result Value Ref Range   Testosterone 490 250 - 827 ng/dL  T4, free  Result Value Ref Range   Free T4 1.2 0.8 - 1.8 ng/dL  Vitamin B12  Result Value Ref Range   Vitamin B-12 353 200 - 1,100 pg/mL  TSH  Result Value Ref Range   TSH 0.95 0.40 - 4.50 mIU/L      Assessment & Plan:   Problem List Items Addressed This Visit     Elevated hemoglobin A1c    Elevated A1c 5.9, prior low 5 range Controlled HTN.  Plan:  1. Not on any therapy currently  2. Encourage improved lifestyle - low carb, low sugar diet, reduce portion size, continue improving regular exercise      Essential hypertension    Controlled BP Normal readings outside office OFF ACEi (cough) Complication with Systolic CHF NICM, AFib Followed by [facility] Dr Clayborn Bigness    Plan:  1. Continue Losartan '25mg'$  daily back on ARB, Carvedilol 3.'125mg'$  BID  2. Encourage improved lifestyle - low sodium diet, regular exercise - keep up maintaining weight loss 3. Continue monitor BP outside office, bring readings to next visit, if persistently >140/90 or new symptoms notify office sooner      Gastroesophageal reflux disease without esophagitis    Improved GERD, Heartburn, Chronic Cough/night now controlled on PPI Omeprazole '40mg'$  daily       OSA on CPAP    Well controlled, chronic OSA on CPAP - Good adherence to CPAP nightly - Continue current CPAP therapy, patient seems to be benefiting from therapy       Pure hypercholesterolemia - Primary  Controlled, improved on lab The 10-year ASCVD risk score (Arnett DK, et al., 2019) is: 8.1%  Plan: 1. Continue Rosuvastatin '10mg'$  nightly 2. Continue ASA '81mg'$  for primary ASCVD risk reduction - discuss with Cardiology Dr Clayborn Bigness may reduce or stop 3. Encourage improved lifestyle - low carb/cholesterol, reduce portion size, continue improving regular exercise      Other Visit Diagnoses     Vitamin D deficiency       Other fatigue       Vitamin B12 nutritional deficiency           Low Vitamin D Start OTC Vitamin D3 5,000 units daily for 12 weeks then reduce to OTC Vitamin D3 1,000 to 2,000 iu daily for maintenance Okay to keep Multvitamin  Recommend OTC Vitamin B12 separate from your multivitamin, 1000 mcg daily pill. To help energy.  No low testosterone. But you may try the OTC "Testosterone Booster Supplements" usually sold at [facility].  No orders of the defined types were placed in this encounter.    Follow up plan: Return in about 6 months (around 07/01/2023) for 6 month follow-up PreDM A1c, HTN, GERD.   Nobie Putnam, DO [facility] 12/31/2022, 8:11 AM

## 2022-12-31 NOTE — Assessment & Plan Note (Signed)
Controlled BP Normal readings outside office OFF ACEi (cough) Complication with Systolic CHF NICM, AFib Followed by [facility] Cardiology Dr Clayborn Bigness    Plan:  1. Continue Losartan '25mg'$  daily back on ARB, Carvedilol 3.'125mg'$  BID  2. Encourage improved lifestyle - low sodium diet, regular exercise - keep up maintaining weight loss 3. Continue monitor BP outside office, bring readings to next visit, if persistently >140/90 or new symptoms notify office sooner

## 2022-12-31 NOTE — Patient Instructions (Addendum)
Thank you for coming to the office today.  Low Vitamin D Start OTC Vitamin D3 5,000 units daily for 12 weeks then reduce to OTC Vitamin D3 1,000 to 2,000 iu daily for maintenance Okay to keep Multvitamin  Recommend OTC Vitamin B12 separate from your multivitamin, 1000 mcg daily pill. To help energy.  Stomach acid GERD / Cough improved, keep on Omeprazole '40mg'$  daily.  Cholesterol controlled.  No low testosterone. But you may try the OTC "Testosterone Booster Supplements" usually sold at [facility].   FRUITS - LIMIT these HIGH sugar/carb fruits = Pineapple, Watermelon, Bananas - OKAY with these MEDIUM sugar/carb fruits = Citrus, Oranges, Grapes - PREFER these LOW sugar/carb fruits = Apples, Berries, Pears, Plums   Please schedule a Follow-up Appointment to: Return in about 6 months (around 07/01/2023) for 6 month follow-up PreDM A1c, HTN, GERD.  If you have any other questions or concerns, please feel free to call the office or send a message through [place]. You may also schedule an earlier appointment if necessary.  Additionally, you may be receiving a survey about your experience at our office within a few days to 1 week by e-mail or mail. We value your feedback.  Nobie Putnam, DO [facility]

## 2022-12-31 NOTE — Assessment & Plan Note (Signed)
Elevated A1c 5.9, prior low 5 range Controlled HTN.  Plan:  1. Not on any therapy currently  2. Encourage improved lifestyle - low carb, low sugar diet, reduce portion size, continue improving regular exercise

## 2023-01-13 DIAGNOSIS — G4733 Obstructive sleep apnea (adult) (pediatric): Secondary | ICD-10-CM | POA: Diagnosis not present

## 2023-02-13 DIAGNOSIS — G4733 Obstructive sleep apnea (adult) (pediatric): Secondary | ICD-10-CM | POA: Diagnosis not present

## 2023-03-11 DIAGNOSIS — R079 Chest pain, unspecified: Secondary | ICD-10-CM | POA: Diagnosis not present

## 2023-03-11 DIAGNOSIS — I2510 Atherosclerotic heart disease of native coronary artery without angina pectoris: Secondary | ICD-10-CM | POA: Diagnosis not present

## 2023-03-11 DIAGNOSIS — R931 Abnormal findings on diagnostic imaging of heart and coronary circulation: Secondary | ICD-10-CM | POA: Diagnosis not present

## 2023-03-11 DIAGNOSIS — R001 Bradycardia, unspecified: Secondary | ICD-10-CM | POA: Diagnosis not present

## 2023-03-14 DIAGNOSIS — G4733 Obstructive sleep apnea (adult) (pediatric): Secondary | ICD-10-CM | POA: Diagnosis not present

## 2023-03-17 ENCOUNTER — Ambulatory Visit: Payer: BC Managed Care – PPO | Admitting: Dermatology

## 2023-03-27 ENCOUNTER — Ambulatory Visit: Payer: BC Managed Care – PPO | Admitting: Dermatology

## 2023-03-27 VITALS — BP 135/80

## 2023-03-27 DIAGNOSIS — D2271 Melanocytic nevi of right lower limb, including hip: Secondary | ICD-10-CM

## 2023-03-27 DIAGNOSIS — L814 Other melanin hyperpigmentation: Secondary | ICD-10-CM | POA: Diagnosis not present

## 2023-03-27 DIAGNOSIS — Z1283 Encounter for screening for malignant neoplasm of skin: Secondary | ICD-10-CM | POA: Diagnosis not present

## 2023-03-27 DIAGNOSIS — Z86006 Personal history of melanoma in-situ: Secondary | ICD-10-CM

## 2023-03-27 DIAGNOSIS — L821 Other seborrheic keratosis: Secondary | ICD-10-CM

## 2023-03-27 DIAGNOSIS — D1801 Hemangioma of skin and subcutaneous tissue: Secondary | ICD-10-CM

## 2023-03-27 DIAGNOSIS — L578 Other skin changes due to chronic exposure to nonionizing radiation: Secondary | ICD-10-CM | POA: Diagnosis not present

## 2023-03-27 DIAGNOSIS — Z85820 Personal history of malignant melanoma of skin: Secondary | ICD-10-CM

## 2023-03-27 DIAGNOSIS — D229 Melanocytic nevi, unspecified: Secondary | ICD-10-CM

## 2023-03-27 DIAGNOSIS — Z86018 Personal history of other benign neoplasm: Secondary | ICD-10-CM

## 2023-03-27 NOTE — Progress Notes (Signed)
Follow-Up Visit   Subjective  Vincent Clements is a 62 y.o. male who presents for the following: Skin Cancer Screening and Full Body Skin Exam, hx of Melanoma IS R lat neck, hx of Dysplastic Nevi  The patient presents for Total-Body Skin Exam (TBSE) for skin cancer screening and mole check. The patient has spots, moles and lesions to be evaluated, some may be new or changing and the patient has concerns that these could be cancer.    The following portions of the chart were reviewed this encounter and updated as appropriate: medications, allergies, medical history  Review of Systems:  No other skin or systemic complaints except as noted in HPI or Assessment and Plan.  Objective  Well appearing patient in no apparent distress; mood and affect are within normal limits.  A full examination was performed including scalp, head, eyes, ears, nose, lips, neck, chest, axillae, abdomen, back, buttocks, bilateral upper extremities, bilateral lower extremities, hands, feet, fingers, toes, fingernails, and toenails. All findings within normal limits unless otherwise noted below.   Relevant physical exam findings are noted in the Assessment and Plan.    Assessment & Plan   LENTIGINES, SEBORRHEIC KERATOSES, HEMANGIOMAS - Benign normal skin lesions - Benign-appearing - Call for any changes  MELANOCYTIC NEVI - Tan-brown and/or pink-flesh-colored symmetric macules and papules - Benign appearing on exam today - Observation - Call clinic for new or changing moles - Recommend daily use of broad spectrum spf 30+ sunscreen to sun-exposed areas.   ACTINIC DAMAGE - Chronic condition, secondary to cumulative UV/sun exposure - diffuse scaly erythematous macules with underlying dyspigmentation - Recommend daily broad spectrum sunscreen SPF 30+ to sun-exposed areas, reapply every 2 hours as needed.  - Staying in the shade or wearing long sleeves, sun glasses (UVA+UVB protection) and wide brim hats (4-inch  brim around the entire circumference of the hat) are also recommended for sun protection.  - Call for new or changing lesions.  SKIN CANCER SCREENING PERFORMED TODAY.   HISTORY OF MELANOMA IN SITU - No evidence of recurrence today, no lymphadenopathy - Recommend regular full body skin exams - Recommend daily broad spectrum sunscreen SPF 30+ to sun-exposed areas, reapply every 2 hours as needed.  - Call if any new or changing lesions are noted between office visits  - R lat neck, mohs 04/05/22  HISTORY OF DYSPLASTIC NEVI - No evidence of recurrence today - Recommend regular full body skin exams - Recommend daily broad spectrum sunscreen SPF 30+ to sun-exposed areas, reapply every 2 hours as needed.  - Call if any new or changing lesions are noted between office visits   EPIDERMAL INCLUSION CYST R sup buttocks Exam: Subcutaneous nodule 1.5cm  Benign-appearing. Exam most consistent with an epidermal inclusion cyst. Discussed that a cyst is a benign growth that can grow over time and sometimes get irritated or inflamed. Recommend observation if it is not bothersome. Discussed option of surgical excision to remove it if it is growing, symptomatic, or other changes noted. Please call for new or changing lesions so they can be evaluated.  NEVUS R plantar foot mid sole Exam: 0.5cm brown macule       Treatment Plan: Benign-appearing.  Observation.  Call clinic for new or changing lesions.  Recommend daily use of broad spectrum spf 30+ sunscreen to sun-exposed areas.      Return in about 4 months (around 07/27/2023) for TBSE, Hx of Melanoma IS, Hx of Dysplastic nevi.  I, Sonya Hupman, RMA, am acting  as scribe for Sarina Ser, MD .   Documentation: I have reviewed the above documentation for accuracy and completeness, and I agree with the above.  Sarina Ser, MD

## 2023-03-27 NOTE — Patient Instructions (Signed)
Due to recent changes in healthcare laws, you may see results of your pathology and/or laboratory studies on MyChart before the doctors have had a chance to review them. We understand that in some cases there may be results that are confusing or concerning to you. Please understand that not all results are received at the same time and often the doctors may need to interpret multiple results in order to provide you with the best plan of care or course of treatment. Therefore, we ask that you please give us 2 business days to thoroughly review all your results before contacting the office for clarification. Should we see a critical lab result, you will be contacted sooner.   If You Need Anything After Your Visit  If you have any questions or concerns for your doctor, please call our main line at [phone] and press option 4 to reach your doctor's medical assistant. If no one answers, please leave a voicemail as directed and we will return your call as soon as possible. Messages left after 4 pm will be answered the following business day.   You may also send us a message via MyChart. We typically respond to MyChart messages within 1-2 business days.  For prescription refills, please ask your pharmacy to contact our office. Our fax number is [phone].  If you have an urgent issue when the clinic is closed that cannot wait until the next business day, you can page your doctor at the number below.    Please note that while we do our best to be available for urgent issues outside of office hours, we are not available 24/7.   If you have an urgent issue and are unable to reach us, you may choose to seek medical care at your doctor's office, retail clinic, urgent care center, or emergency room.  If you have a medical emergency, please immediately call 911 or go to the emergency department.  Pager Numbers  - Dr. Kowalski: [phone]  - Dr. Moye: [phone]  - Dr. Stewart:  [phone]  In the event of inclement weather, please call our main line at [phone] for an update on the status of any delays or closures.  Dermatology Medication Tips: Please keep the boxes that topical medications come in in order to help keep track of the instructions about where and how to use these. Pharmacies typically print the medication instructions only on the boxes and not directly on the medication tubes.   If your medication is too expensive, please contact our office at [phone] option 4 or send us a message through MyChart.   We are unable to tell what your co-pay for medications will be in advance as this is different depending on your insurance coverage. However, we may be able to find a substitute medication at lower cost or fill out paperwork to get insurance to cover a needed medication.   If a prior authorization is required to get your medication covered by your insurance company, please allow us 1-2 business days to complete this process.  Drug prices often vary depending on where the prescription is filled and some pharmacies may offer cheaper prices.  The website www.goodrx.com contains coupons for medications through different pharmacies. The prices here do not account for what the cost may be with help from insurance (it may be cheaper with your insurance), but the website can give you the price if you did not use any insurance.  - You can print the associated coupon and take it with   your prescription to the pharmacy.  - You may also stop by our office during regular business hours and pick up a GoodRx coupon card.  - If you need your prescription sent electronically to a different pharmacy, notify our office through [place] MyChart or by phone at [phone] option 4.     Si Usted Necesita Algo Despus de Su Visita  Tambin puede enviarnos un mensaje a travs de MyChart. Por lo general respondemos a los mensajes de MyChart en el transcurso de 1 a 2  das hbiles.  Para renovar recetas, por favor pida a su farmacia que se ponga en contacto con nuestra oficina. Nuestro nmero de fax es el [phone].  Si tiene un asunto urgente cuando la clnica est cerrada y que no puede esperar hasta el siguiente da hbil, puede llamar/localizar a su doctor(a) al nmero que aparece a continuacin.   Por favor, tenga en cuenta que aunque hacemos todo lo posible para estar disponibles para asuntos urgentes fuera del horario de oficina, no estamos disponibles las 24 horas del da, los 7 das de la semana.   Si tiene un problema urgente y no puede comunicarse con nosotros, puede optar por buscar atencin mdica  en el consultorio de su doctor(a), en una clnica privada, en un centro de atencin urgente o en una sala de emergencias.  Si tiene una emergencia mdica, por favor llame inmediatamente al 911 o vaya a la sala de emergencias.  Nmeros de bper  - Dr. Kowalski: [phone]  - Dra. Moye: [phone]  - Dra. Stewart: [phone]  En caso de inclemencias del tiempo, por favor llame a nuestra lnea principal al [phone] para una actualizacin sobre el estado de cualquier retraso o cierre.  Consejos para la medicacin en dermatologa: Por favor, guarde las cajas en las que vienen los medicamentos de uso tpico para ayudarle a seguir las instrucciones sobre dnde y cmo usarlos. Las farmacias generalmente imprimen las instrucciones del medicamento slo en las cajas y no directamente en los tubos del medicamento.   Si su medicamento es muy caro, por favor, pngase en contacto con nuestra oficina llamando al [phone] y presione la opcin 4 o envenos un mensaje a travs de MyChart.   No podemos decirle cul ser su copago por los medicamentos por adelantado ya que esto es diferente dependiendo de la cobertura de su seguro. Sin embargo, es posible que podamos encontrar un medicamento sustituto a menor costo o llenar un formulario para que el  seguro cubra el medicamento que se considera necesario.   Si se requiere una autorizacin previa para que su compaa de seguros cubra su medicamento, por favor permtanos de 1 a 2 das hbiles para completar este proceso.  Los precios de los medicamentos varan con frecuencia dependiendo del lugar de dnde se surte la receta y alguna farmacias pueden ofrecer precios ms baratos.  El sitio web www.goodrx.com tiene cupones para medicamentos de diferentes farmacias. Los precios aqu no tienen en cuenta lo que podra costar con la ayuda del seguro (puede ser ms barato con su seguro), pero el sitio web puede darle el precio si no utiliz ningn seguro.  - Puede imprimir el cupn correspondiente y llevarlo con su receta a la farmacia.  - Tambin puede pasar por nuestra oficina durante el horario de atencin regular y recoger una tarjeta de cupones de GoodRx.  - Si necesita que su receta se enve electrnicamente a una farmacia diferente, informe a nuestra oficina a travs de MyChart de [place]   o por telfono llamando al [phone] y presione la opcin 4.  

## 2023-04-01 ENCOUNTER — Encounter: Payer: Self-pay | Admitting: Dermatology

## 2023-04-13 ENCOUNTER — Other Ambulatory Visit: Payer: Self-pay | Admitting: Family Medicine

## 2023-04-13 DIAGNOSIS — E7800 Pure hypercholesterolemia, unspecified: Secondary | ICD-10-CM

## 2023-04-14 DIAGNOSIS — G4733 Obstructive sleep apnea (adult) (pediatric): Secondary | ICD-10-CM | POA: Diagnosis not present

## 2023-04-14 NOTE — Telephone Encounter (Signed)
Requested medication (s) are due for refill today: expired medication  Requested medication (s) are on the active medication list: yes   Last refill:  01/28/22 #90 3 refills  Future visit scheduled: yes in 1 month  Notes to clinic:   expired medication . Do you want to renew Rx? Or give courtesy refill     Requested Prescriptions  Pending Prescriptions Disp Refills   rosuvastatin (CRESTOR) 10 MG tablet [Pharmacy Med Name: ROSUVASTATIN TABS 10MG ] 90 tablet 3    Sig: TAKE 1 TABLET AT BEDTIME     Cardiovascular:  Antilipid - Statins 2 Failed - 04/13/2023  8:56 AM      Failed - Lipid Panel in normal range within the last 12 months    Cholesterol  Date Value Ref Range Status  12/24/2022 143 <200 mg/dL Final   LDL Cholesterol (Calc)  Date Value Ref Range Status  12/24/2022 80 mg/dL (calc) Final    Comment:    Reference range: <100 . Desirable range <100 mg/dL for primary prevention;   <70 mg/dL for patients with CHD or diabetic patients  with > or = 2 CHD risk factors. Marland Kitchen LDL-C is now calculated using the Martin-Hopkins  calculation, which is a validated novel method providing  better accuracy than the Friedewald equation in the  estimation of LDL-C.  Horald Pollen et al. Lenox Ahr. 5859;292(44): 2061-2068  (http://education.[employer].com/faq/FAQ164)    HDL  Date Value Ref Range Status  12/24/2022 42 > OR = 40 mg/dL Final   Triglycerides  Date Value Ref Range Status  12/24/2022 117 <150 mg/dL Final         Passed - Cr in normal range and within 360 days    Creat  Date Value Ref Range Status  11/11/2022 0.70 0.70 - 1.30 mg/dL Final         Passed - Patient is not pregnant      Passed - Valid encounter within last 12 months    Recent Outpatient Visits           3 months ago Pure hypercholesterolemia   [facility], Netta Neat, DO   5 months ago Annual physical exam   [facility] [place],  Netta Neat, DO   1 year ago Obesity (BMI 30.0-34.9)   [facility] Althea Charon, Netta Neat, DO   1 year ago Needs flu shot   [facility], Netta Neat, DO   2 years ago Pure hypercholesterolemia    [facility] Smitty Cords, DO       Future Appointments             In 1 month Althea Charon, Netta Neat, DO  [facility], [place]   In 3 months Deirdre Evener, MD [facility]

## 2023-05-14 DIAGNOSIS — G4733 Obstructive sleep apnea (adult) (pediatric): Secondary | ICD-10-CM | POA: Diagnosis not present

## 2023-06-02 ENCOUNTER — Encounter: Payer: Self-pay | Admitting: Family Medicine

## 2023-06-02 ENCOUNTER — Other Ambulatory Visit: Payer: Self-pay | Admitting: Family Medicine

## 2023-06-02 ENCOUNTER — Ambulatory Visit: Payer: BC Managed Care – PPO | Admitting: Family Medicine

## 2023-06-02 VITALS — BP 132/78 | HR 78 | Ht 70.0 in | Wt 215.0 lb

## 2023-06-02 DIAGNOSIS — E669 Obesity, unspecified: Secondary | ICD-10-CM

## 2023-06-02 DIAGNOSIS — G4733 Obstructive sleep apnea (adult) (pediatric): Secondary | ICD-10-CM

## 2023-06-02 DIAGNOSIS — E559 Vitamin D deficiency, unspecified: Secondary | ICD-10-CM

## 2023-06-02 DIAGNOSIS — R7309 Other abnormal glucose: Secondary | ICD-10-CM

## 2023-06-02 DIAGNOSIS — E7800 Pure hypercholesterolemia, unspecified: Secondary | ICD-10-CM

## 2023-06-02 DIAGNOSIS — K219 Gastro-esophageal reflux disease without esophagitis: Secondary | ICD-10-CM

## 2023-06-02 DIAGNOSIS — Z0000 Encounter for general adult medical examination without abnormal findings: Secondary | ICD-10-CM

## 2023-06-02 DIAGNOSIS — I10 Essential (primary) hypertension: Secondary | ICD-10-CM

## 2023-06-02 LAB — POCT GLYCOSYLATED HEMOGLOBIN (HGB A1C): Hemoglobin A1C: 5.4 % (ref 4.0–5.6)

## 2023-06-02 MED ORDER — OMEPRAZOLE 20 MG PO CPDR: 20.0000 mg | DELAYED_RELEASE_CAPSULE | Freq: Every day | ORAL | 1 refills | Status: DC

## 2023-06-02 NOTE — Assessment & Plan Note (Addendum)
Controlled BP On reduced meds Home Bp lower than office OFF ACEi (cough) Complication with Systolic CHF NICM, AFib Followed by [facility] Cardiology Dr Juliann Pares Off Carvedilol 3.125, Losartan 25mg   Question if dizziness side effect is from PPI Omeprazole or Amlodipine, seems orthostatic in nature / physical activity related   Plan:  Continues on Amlodipine 5mg  daily - future reconsider dose adjust to 2.5mg  dosing given his wt loss 2. Encourage improved lifestyle - low sodium diet, regular exercise - keep up maintaining weight loss 3. Continue monitor BP outside office, bring readings to next visit, if persistently >140/90 or new symptoms notify office sooner

## 2023-06-02 NOTE — Progress Notes (Signed)
Subjective:    Patient ID: Vincent Clements, male    DOB: 06-Jan-1961, 62 y.o.   MRN: [id number]  Vincent Clements is a 62 y.o. male presenting on 06/02/2023 for Gastroesophageal Reflux (Patient has gotten a letter saying prilosec can cause liver problems)   HPI  GERD Persistent Cough - RESOLVED Previous visit discussed GERD and Dry cough intermittent >1-2 years He continues Omeprazole 40mg  daily, doing well, has small amount left Resolved cough no GERD He request to adjust dose Question if dizziness related side effect. Seems to occur in morning when standing and active, brief 30 min or less, no room spinning, then resolves w/ activity Denies vertigo No more using TUMs Improved diet as well  CHRONIC HTN: Improved BP overall. BP 110-120s / 70s Current Meds - Amlodipine 5mg  daily OFF Carvedilol 3.125mg  TWICE A DAY, Losartan 25mg  daily Reports good compliance, took meds today. Tolerating well, w/o complaints. Denies CP, dyspnea, HA, edema, dizziness / lightheadedness  Mild Elevated A1c without dx Pre-Diabetes  Last A1c 5.9 elevation. Due for POC A1c today CBGs: Not checking Meds: never on med Reports good compliance. Tolerating well w/o side-effects Currently not on ACEi/ARB - off losartan Lifestyle: Weight loss 4 lbs in 2 months - Diet (goal to increase water intake, goal to limit red meat and improve diet, inc vegetables - limits sugar - Exercise (active but limited exercise at times with knee, good days and bad days) Denies hypoglycemia      06/02/2023    8:24 AM 12/31/2022    8:09 AM 11/15/2022    8:04 AM  Depression screen PHQ 2/9  Decreased Interest 0 0 1  Down, Depressed, Hopeless 0 0 1  PHQ - 2 Score 0 0 2  Altered sleeping  1 1  Tired, decreased energy  1 1  Change in appetite  0 0  Feeling bad or failure about yourself   0 0  Trouble concentrating  0 0  Moving slowly or fidgety/restless  0 0  Suicidal thoughts  0 0  PHQ-9 Score  2 4  Difficult doing work/chores   Not difficult at all Not difficult at all    Social History   Tobacco Use   Smoking status: Never   Smokeless tobacco: Former    Types: Chew, Snuff    Quit date: 12/31/1991   Tobacco comments:    pt quit chewing tobacco 01/11/2006  Vaping Use   Vaping Use: Never used  Substance Use Topics   Alcohol use: Not Currently    Comment: occasionally, 1 per week   Drug use: No    Review of Systems Per HPI unless specifically indicated above     Objective:    BP 132/78   Pulse 78   Ht 5\' 10"  (1.778 m)   Wt 215 lb (97.5 kg)   SpO2 97%   BMI 30.85 kg/m   Wt Readings from Last 3 Encounters:  06/02/23 215 lb (97.5 kg)  12/31/22 244 lb (110.7 kg)  11/15/22 248 lb (112.5 kg)    Physical Exam Vitals and nursing note reviewed.  Constitutional:      General: He is not in acute distress.    Appearance: Normal appearance. He is well-developed. He is not diaphoretic.     Comments: Well-appearing, comfortable, cooperative  HENT:     Head: Normocephalic and atraumatic.  Eyes:     General:        Right eye: No discharge.  Left eye: No discharge.     Conjunctiva/sclera: Conjunctivae normal.  Cardiovascular:     Rate and Rhythm: Normal rate.  Pulmonary:     Effort: Pulmonary effort is normal.  Skin:    General: Skin is warm and dry.     Findings: No erythema or rash.  Neurological:     Mental Status: He is alert and oriented to person, place, and time.  Psychiatric:        Mood and Affect: Mood normal.        Behavior: Behavior normal.        Thought Content: Thought content normal.     Comments: Well groomed, good eye contact, normal speech and thoughts      Results for orders placed or performed in visit on 06/02/23  POCT glycosylated hemoglobin (Hb A1C)  Result Value Ref Range   Hemoglobin A1C 5.4 4.0 - 5.6 %      Assessment & Plan:   Problem List Items Addressed This Visit     Elevated hemoglobin A1c    Improved A1c from 5.9 down to 5.4 w/ wt loss and  lifestyle Controlled HTN.  Plan:  1. Not on any therapy currently  2. Encourage improved lifestyle - low carb, low sugar diet, reduce portion size, continue improving regular exercise      Relevant Orders   POCT glycosylated hemoglobin (Hb A1C) (Completed)   Essential hypertension - Primary    Controlled BP On reduced meds Home Bp lower than office OFF ACEi (cough) Complication with Systolic CHF NICM, AFib Followed by [facility] Cardiology Dr Juliann Pares Off Carvedilol 3.125, Losartan 25mg   Question if dizziness side effect is from PPI Omeprazole or Amlodipine, seems orthostatic in nature / physical activity related   Plan:  Continues on Amlodipine 5mg  daily - future reconsider dose adjust to 2.5mg  dosing given his wt loss 2. Encourage improved lifestyle - low sodium diet, regular exercise - keep up maintaining weight loss 3. Continue monitor BP outside office, bring readings to next visit, if persistently >140/90 or new symptoms notify office sooner      Relevant Medications   amLODipine (NORVASC) 5 MG tablet   Gastroesophageal reflux disease without esophagitis    Resolved GERD + chronic cough on PPI therapy several months. Will phase down on med now, question if side effect due to PPI  May stay at 20mg  if resolves side effect or can trial off med eventually  Phase down on the Omeprazole, new order, for 20mg  to [facility], can alternate 40 20 40 20, then only 20 for a while, then down to 20 skip 20 skip 20 and then off if you are feeling good       Relevant Medications   omeprazole (PRILOSEC) 20 MG capsule   Obesity (BMI 30.0-34.9)    Meds ordered this encounter  Medications   omeprazole (PRILOSEC) 20 MG capsule    Sig: Take 1 capsule (20 mg total) by mouth daily before breakfast.    Dispense:  90 capsule    Refill:  1      Follow up plan: Return in about 5 months (around 11/02/2023) for 5 months fasting lab only then 1 week later Annual Physical.  Future labs  ordered for 11/04/23   Saralyn Pilar, DO [facility] 06/02/2023, 8:19 AM

## 2023-06-02 NOTE — Assessment & Plan Note (Addendum)
Resolved GERD + chronic cough on PPI therapy several months. Will phase down on med now, question if side effect due to PPI  May stay at 20mg  if resolves side effect or can trial off med eventually  Phase down on the Omeprazole, new order, for 20mg  to [facility], can alternate 40 20 40 20, then only 20 for a while, then down to 20 skip 20 skip 20 and then off if you are feeling good

## 2023-06-02 NOTE — Assessment & Plan Note (Signed)
Improved A1c from 5.9 down to 5.4 w/ wt loss and lifestyle Controlled HTN.  Plan:  1. Not on any therapy currently  2. Encourage improved lifestyle - low carb, low sugar diet, reduce portion size, continue improving regular exercise

## 2023-06-02 NOTE — Patient Instructions (Addendum)
Thank you for coming to the office today.  A1c today  Keep up the good work  May need to dial down Amlodipine in future.  Phase down on the Omeprazole, new order, for 20mg  to [facility], can alternate 40 20 40 20, then only 20 for a while, then down to 20 skip 20 skip 20 and then off if you are feeling good  DUE for FASTING BLOOD WORK (no food or drink after midnight before the lab appointment, only water or coffee without cream/sugar on the morning of)  SCHEDULE "Lab Only" visit in the morning at the clinic for lab draw in 5 MONTHS   - Make sure Lab Only appointment is at about 1 week before your next appointment, so that results will be available  For Lab Results, once available within 2-3 days of blood draw, you can can log in to MyChart online to view your results and a brief explanation. Also, we can discuss results at next follow-up visit.  Please schedule a Follow-up Appointment to: Return in about 5 months (around 11/02/2023) for 5 months fasting lab only then 1 week later Annual Physical.  If you have any other questions or concerns, please feel free to call the office or send a message through MyChart. You may also schedule an earlier appointment if necessary.  Additionally, you may be receiving a survey about your experience at our office within a few days to 1 week by e-mail or mail. We value your feedback.  Saralyn Pilar, DO [place], [place]

## 2023-06-13 DIAGNOSIS — G4733 Obstructive sleep apnea (adult) (pediatric): Secondary | ICD-10-CM | POA: Diagnosis not present

## 2023-06-14 DIAGNOSIS — G4733 Obstructive sleep apnea (adult) (pediatric): Secondary | ICD-10-CM | POA: Diagnosis not present

## 2023-07-13 DIAGNOSIS — G4733 Obstructive sleep apnea (adult) (pediatric): Secondary | ICD-10-CM | POA: Diagnosis not present

## 2023-07-24 ENCOUNTER — Ambulatory Visit: Payer: BC Managed Care – PPO | Admitting: Dermatology

## 2023-07-24 VITALS — BP 140/79

## 2023-07-24 DIAGNOSIS — Z86018 Personal history of other benign neoplasm: Secondary | ICD-10-CM

## 2023-07-24 DIAGNOSIS — L578 Other skin changes due to chronic exposure to nonionizing radiation: Secondary | ICD-10-CM

## 2023-07-24 DIAGNOSIS — L821 Other seborrheic keratosis: Secondary | ICD-10-CM

## 2023-07-24 DIAGNOSIS — L820 Inflamed seborrheic keratosis: Secondary | ICD-10-CM

## 2023-07-24 DIAGNOSIS — D229 Melanocytic nevi, unspecified: Secondary | ICD-10-CM

## 2023-07-24 DIAGNOSIS — W908XXA Exposure to other nonionizing radiation, initial encounter: Secondary | ICD-10-CM

## 2023-07-24 DIAGNOSIS — L814 Other melanin hyperpigmentation: Secondary | ICD-10-CM | POA: Diagnosis not present

## 2023-07-24 DIAGNOSIS — D2271 Melanocytic nevi of right lower limb, including hip: Secondary | ICD-10-CM

## 2023-07-24 DIAGNOSIS — L720 Epidermal cyst: Secondary | ICD-10-CM

## 2023-07-24 DIAGNOSIS — L729 Follicular cyst of the skin and subcutaneous tissue, unspecified: Secondary | ICD-10-CM

## 2023-07-24 DIAGNOSIS — Z1283 Encounter for screening for malignant neoplasm of skin: Secondary | ICD-10-CM

## 2023-07-24 DIAGNOSIS — Z85820 Personal history of malignant melanoma of skin: Secondary | ICD-10-CM

## 2023-07-24 DIAGNOSIS — Z86006 Personal history of melanoma in-situ: Secondary | ICD-10-CM

## 2023-07-24 DIAGNOSIS — L918 Other hypertrophic disorders of the skin: Secondary | ICD-10-CM

## 2023-07-24 NOTE — Progress Notes (Signed)
Follow-Up Visit   Subjective  Vincent Clements is a 62 y.o. male who presents for the following: Skin Cancer Screening and Full Body Skin Exam, hx of Melanoma IS, hx of Dysplastic Nevi  The patient presents for Total-Body Skin Exam (TBSE) for skin cancer screening and mole check. The patient has spots, moles and lesions to be evaluated, some may be new or changing and the patient may have concern these could be cancer.    The following portions of the chart were reviewed this encounter and updated as appropriate: medications, allergies, medical history  Review of Systems:  No other skin or systemic complaints except as noted in HPI or Assessment and Plan.  Objective  Well appearing patient in no apparent distress; mood and affect are within normal limits.  A full examination was performed including scalp, head, eyes, ears, nose, lips, neck, chest, axillae, abdomen, back, buttocks, bilateral upper extremities, bilateral lower extremities, hands, feet, fingers, toes, fingernails, and toenails. All findings within normal limits unless otherwise noted below.   Relevant physical exam findings are noted in the Assessment and Plan.  L temple x 3 (3) Stuck on waxy paps with erythema    Assessment & Plan   SKIN CANCER SCREENING PERFORMED TODAY.  ACTINIC DAMAGE - Chronic condition, secondary to cumulative UV/sun exposure - diffuse scaly erythematous macules with underlying dyspigmentation - Recommend daily broad spectrum sunscreen SPF 30+ to sun-exposed areas, reapply every 2 hours as needed.  - Staying in the shade or wearing long sleeves, sun glasses (UVA+UVB protection) and wide brim hats (4-inch brim around the entire circumference of the hat) are also recommended for sun protection.  - Call for new or changing lesions.  LENTIGINES, SEBORRHEIC KERATOSES, HEMANGIOMAS - Benign normal skin lesions - Benign-appearing - Call for any changes  MELANOCYTIC NEVI - Tan-brown and/or  pink-flesh-colored symmetric macules and papules - Benign appearing on exam today - Observation - Call clinic for new or changing moles - Recommend daily use of broad spectrum spf 30+ sunscreen to sun-exposed areas.  - R plantar foot mid sole 0.5cm brown macule, stable compared to photo  HISTORY OF MELANOMA IN SITU - No evidence of recurrence today - No lymphadenopathy - Recommend regular full body skin exams - Recommend daily broad spectrum sunscreen SPF 30+ to sun-exposed areas, reapply every 2 hours as needed.  - Call if any new or changing lesions are noted between office visits  - R lat neck excised 04/05/22  Inflamed seborrheic keratosis (3) L temple x 3  Symptomatic, irritating, patient would like treated.    Destruction of lesion - L temple x 3 (3) Complexity: simple   Destruction method: cryotherapy   Informed consent: discussed and consent obtained   Timeout:  patient name, date of birth, surgical site, and procedure verified Lesion destroyed using liquid nitrogen: Yes   Region frozen until ice ball extended beyond lesion: Yes   Outcome: patient tolerated procedure well with no complications   Post-procedure details: wound care instructions given    HISTORY OF DYSPLASTIC NEVUS No evidence of recurrence today Recommend regular full body skin exams Recommend daily broad spectrum sunscreen SPF 30+ to sun-exposed areas, reapply every 2 hours as needed.  Call if any new or changing lesions are noted between office visits   EPIDERMAL INCLUSION CYST Exam: Subcutaneous nodule at R post waistline 1.5cm  Treatment: Benign-appearing. Exam most consistent with an epidermal inclusion cyst. Discussed that a cyst is a benign growth that can grow over time  and sometimes get irritated or inflamed. Recommend observation if it is not bothersome. Discussed option of surgical excision to remove it if it is growing, symptomatic, or other changes noted. Please call for new or changing  lesions so they can be evaluated.  Acrochordons (Skin Tags) - Fleshy, skin-colored pedunculated papules - Benign appearing.  - Observe. - If desired, they can be removed with an in office procedure that is not covered by insurance. - Please call the clinic if you notice any new or changing lesions.   Return in about 4 months (around 11/24/2023) for TBSE, Hx of Melanoma IS, Hx of Dysplastic nevi.  I, Ardis Rowan, RMA, am acting as scribe for Armida Sans, MD .   Documentation: I have reviewed the above documentation for accuracy and completeness, and I agree with the above.  Armida Sans, MD

## 2023-07-24 NOTE — Patient Instructions (Signed)

## 2023-07-28 ENCOUNTER — Encounter: Payer: Self-pay | Admitting: Dermatology

## 2023-08-13 DIAGNOSIS — G4733 Obstructive sleep apnea (adult) (pediatric): Secondary | ICD-10-CM | POA: Diagnosis not present

## 2023-09-11 DIAGNOSIS — I480 Paroxysmal atrial fibrillation: Secondary | ICD-10-CM | POA: Diagnosis not present

## 2023-09-11 DIAGNOSIS — E7800 Pure hypercholesterolemia, unspecified: Secondary | ICD-10-CM | POA: Diagnosis not present

## 2023-09-11 DIAGNOSIS — G4733 Obstructive sleep apnea (adult) (pediatric): Secondary | ICD-10-CM | POA: Diagnosis not present

## 2023-09-11 DIAGNOSIS — I10 Essential (primary) hypertension: Secondary | ICD-10-CM | POA: Diagnosis not present

## 2023-09-16 DIAGNOSIS — G4733 Obstructive sleep apnea (adult) (pediatric): Secondary | ICD-10-CM | POA: Diagnosis not present

## 2023-10-16 DIAGNOSIS — G4733 Obstructive sleep apnea (adult) (pediatric): Secondary | ICD-10-CM | POA: Diagnosis not present

## 2023-11-03 ENCOUNTER — Other Ambulatory Visit: Payer: Self-pay

## 2023-11-03 DIAGNOSIS — G4733 Obstructive sleep apnea (adult) (pediatric): Secondary | ICD-10-CM

## 2023-11-03 DIAGNOSIS — E66811 Obesity, class 1: Secondary | ICD-10-CM

## 2023-11-03 DIAGNOSIS — E7800 Pure hypercholesterolemia, unspecified: Secondary | ICD-10-CM

## 2023-11-03 DIAGNOSIS — I10 Essential (primary) hypertension: Secondary | ICD-10-CM

## 2023-11-03 DIAGNOSIS — R7309 Other abnormal glucose: Secondary | ICD-10-CM

## 2023-11-03 DIAGNOSIS — E559 Vitamin D deficiency, unspecified: Secondary | ICD-10-CM

## 2023-11-03 DIAGNOSIS — Z0000 Encounter for general adult medical examination without abnormal findings: Secondary | ICD-10-CM

## 2023-11-04 ENCOUNTER — Other Ambulatory Visit: Payer: BC Managed Care – PPO

## 2023-11-04 DIAGNOSIS — I10 Essential (primary) hypertension: Secondary | ICD-10-CM | POA: Diagnosis not present

## 2023-11-04 DIAGNOSIS — E559 Vitamin D deficiency, unspecified: Secondary | ICD-10-CM | POA: Diagnosis not present

## 2023-11-04 DIAGNOSIS — R7309 Other abnormal glucose: Secondary | ICD-10-CM | POA: Diagnosis not present

## 2023-11-04 DIAGNOSIS — Z0000 Encounter for general adult medical examination without abnormal findings: Secondary | ICD-10-CM | POA: Diagnosis not present

## 2023-11-04 DIAGNOSIS — E7800 Pure hypercholesterolemia, unspecified: Secondary | ICD-10-CM | POA: Diagnosis not present

## 2023-11-05 LAB — COMPLETE METABOLIC PANEL WITH GFR
AG Ratio: 1.7 (calc) (ref 1.0–2.5)
ALT: 24 U/L (ref 9–46)
AST: 19 U/L (ref 10–35)
Albumin: 4.5 g/dL (ref 3.6–5.1)
Alkaline phosphatase (APISO): 61 U/L (ref 35–144)
BUN: 13 mg/dL (ref 7–25)
CO2: 30 mmol/L (ref 20–32)
Calcium: 9.9 mg/dL (ref 8.6–10.3)
Chloride: 104 mmol/L (ref 98–110)
Creat: 0.77 mg/dL (ref 0.70–1.35)
Globulin: 2.6 g/dL (ref 1.9–3.7)
Glucose, Bld: 103 mg/dL — ABNORMAL HIGH (ref 65–99)
Potassium: 4.1 mmol/L (ref 3.5–5.3)
Sodium: 141 mmol/L (ref 135–146)
Total Bilirubin: 0.5 mg/dL (ref 0.2–1.2)
Total Protein: 7.1 g/dL (ref 6.1–8.1)
eGFR: 102 mL/min/{1.73_m2} (ref >=60)

## 2023-11-05 LAB — CBC WITH DIFFERENTIAL/PLATELET
Absolute Lymphocytes: 1275 {cells}/uL (ref 850–3900)
Absolute Monocytes: 788 {cells}/uL (ref 200–950)
Basophils Absolute: 68 {cells}/uL (ref 0–200)
Basophils Relative: 0.9 %
Eosinophils Absolute: 330 {cells}/uL (ref 15–500)
Eosinophils Relative: 4.4 %
HCT: 50.9 % — ABNORMAL HIGH (ref 38.5–50.0)
Hemoglobin: 16.8 g/dL (ref 13.2–17.1)
MCH: 29.9 pg (ref 27.0–33.0)
MCHC: 33 g/dL (ref 32.0–36.0)
MCV: 90.7 fL (ref 80.0–100.0)
MPV: 11.1 fL (ref 7.5–12.5)
Monocytes Relative: 10.5 %
Neutro Abs: 5040 {cells}/uL (ref 1500–7800)
Neutrophils Relative %: 67.2 %
Platelets: 303 10*3/uL (ref 140–400)
RBC: 5.61 10*6/uL (ref 4.20–5.80)
RDW: 12.7 % (ref 11.0–15.0)
Total Lymphocyte: 17 %
WBC: 7.5 10*3/uL (ref 3.8–10.8)

## 2023-11-05 LAB — VITAMIN D 25 HYDROXY (VIT D DEFICIENCY, FRACTURES): Vit D, 25-Hydroxy: 41 ng/mL (ref 30–100)

## 2023-11-05 LAB — PSA: PSA: 0.98 ng/mL (ref <=4.00)

## 2023-11-05 LAB — LIPID PANEL
Cholesterol: 142 mg/dL (ref <200)
HDL: 53 mg/dL (ref >=40)
LDL Cholesterol (Calc): 74 mg/dL
Non-HDL Cholesterol (Calc): 89 mg/dL (ref <130)
Total CHOL/HDL Ratio: 2.7 (calc) (ref <5.0)
Triglycerides: 73 mg/dL (ref <150)

## 2023-11-05 LAB — HEMOGLOBIN A1C
Hgb A1c MFr Bld: 5.5 %{Hb} (ref <5.7)
Mean Plasma Glucose: 111 mg/dL
eAG (mmol/L): 6.2 mmol/L

## 2023-11-05 LAB — TSH: TSH: 1.28 m[IU]/L (ref 0.40–4.50)

## 2023-11-11 ENCOUNTER — Ambulatory Visit (INDEPENDENT_AMBULATORY_CARE_PROVIDER_SITE_OTHER): Payer: BC Managed Care – PPO | Admitting: Family Medicine

## 2023-11-11 ENCOUNTER — Other Ambulatory Visit: Payer: Self-pay | Admitting: Family Medicine

## 2023-11-11 ENCOUNTER — Encounter: Payer: Self-pay | Admitting: Family Medicine

## 2023-11-11 VITALS — BP 124/70 | Ht 70.0 in | Wt 210.0 lb

## 2023-11-11 DIAGNOSIS — R7309 Other abnormal glucose: Secondary | ICD-10-CM

## 2023-11-11 DIAGNOSIS — I10 Essential (primary) hypertension: Secondary | ICD-10-CM

## 2023-11-11 DIAGNOSIS — K219 Gastro-esophageal reflux disease without esophagitis: Secondary | ICD-10-CM | POA: Diagnosis not present

## 2023-11-11 DIAGNOSIS — E559 Vitamin D deficiency, unspecified: Secondary | ICD-10-CM

## 2023-11-11 DIAGNOSIS — E66811 Obesity, class 1: Secondary | ICD-10-CM

## 2023-11-11 DIAGNOSIS — R351 Nocturia: Secondary | ICD-10-CM

## 2023-11-11 DIAGNOSIS — Z0000 Encounter for general adult medical examination without abnormal findings: Secondary | ICD-10-CM

## 2023-11-11 DIAGNOSIS — Z125 Encounter for screening for malignant neoplasm of prostate: Secondary | ICD-10-CM

## 2023-11-11 DIAGNOSIS — Z23 Encounter for immunization: Secondary | ICD-10-CM | POA: Diagnosis not present

## 2023-11-11 DIAGNOSIS — Z85820 Personal history of malignant melanoma of skin: Secondary | ICD-10-CM | POA: Insufficient documentation

## 2023-11-11 DIAGNOSIS — G4733 Obstructive sleep apnea (adult) (pediatric): Secondary | ICD-10-CM

## 2023-11-11 DIAGNOSIS — I480 Paroxysmal atrial fibrillation: Secondary | ICD-10-CM

## 2023-11-11 DIAGNOSIS — E7800 Pure hypercholesterolemia, unspecified: Secondary | ICD-10-CM

## 2023-11-11 MED ORDER — OMEPRAZOLE 40 MG PO CPDR: 40.0000 mg | DELAYED_RELEASE_CAPSULE | Freq: Every day | ORAL | 3 refills | Status: DC

## 2023-11-11 NOTE — Assessment & Plan Note (Signed)
Increased GERD symptoms on 20mg  dose Re order 40mg  dose Omeprazole

## 2023-11-11 NOTE — Assessment & Plan Note (Signed)
Followed by Dermatology - [facility] Dr Gwen Pounds

## 2023-11-11 NOTE — Assessment & Plan Note (Addendum)
Controlled A1c 5.5 Below PreDM range Continue lifestyle modification weight loss

## 2023-11-11 NOTE — Patient Instructions (Addendum)
Thank you for coming to the office today.  Excellent results overall  Labs look great for A1c sugar and cholesterol Keep on current meds  Dose increase Omeprazole from 20 to 40mg   DUE for FASTING BLOOD WORK (no food or drink after midnight before the lab appointment, only water or coffee without cream/sugar on the morning of)  SCHEDULE "Lab Only" visit in the morning at the clinic for lab draw in 1 YEAR  - Make sure Lab Only appointment is at about 1 week before your next appointment, so that results will be available  For Lab Results, once available within 2-3 days of blood draw, you can can log in to MyChart online to view your results and a brief explanation. Also, we can discuss results at next follow-up visit.   Please schedule a Follow-up Appointment to: Return in about 1 year (around 11/10/2024) for 1 year fasting lab > 1 week later Annual Physical (prefer AM apt).  If you have any other questions or concerns, please feel free to call the office or send a message through MyChart. You may also schedule an earlier appointment if necessary.  Additionally, you may be receiving a survey about your experience at our office within a few days to 1 week by e-mail or mail. We value your feedback.  Saralyn Pilar, DO [facility], [place]

## 2023-11-11 NOTE — Progress Notes (Signed)
Subjective:    Patient ID: Vincent Clements, male    DOB: 1961/11/06, 62 y.o.   MRN: [id number]  Vincent Clements is a 62 y.o. male presenting on 11/11/2023 for Annual Exam   HPI  Here for Annual Physical and Lab Review  Discussed the use of AI scribe software for clinical note transcription with the patient, who gave verbal consent to proceed.     GERD Previously on lower dose Omeprazole 20mg  but uncontrolled Prefer to return to 40mg  dose   CHRONIC HTN: Previously improved BP. He was having LOW BP readings and he felt terrible. Cardiology previously discontinued Carvedilol 3.125mg  dose due to low BP. He has taken PRN Current Meds - Amlodipine 5mg  daily OFF Carvedilol 3.125mg  TWICE A DAY, Losartan 25mg  daily Reports good compliance, took meds today. Tolerating well, w/o complaints. Denies CP, dyspnea, HA, edema, dizziness / lightheadedness   Pre-Diabetes Update now A1c down to 5.5, with overhauled diet CBGs: Not checking Meds: never on med Reports good compliance. Tolerating well w/o side-effects Currently not on ACEi/ARB - off losartan Lifestyle: Weight loss 35 lbs in past 10 months - Diet (improved healthy diet and limited carbs and reading food labels) - Exercise (active) Denies hypoglycemia  HYPERLIPIDEMIA: - Reports no concerns. Last lipid panel 10/2023, controlled with lifestyle diet and med - Currently taking Rosuvastatin 10mg , tolerating well without side effects or myalgias   Vitamin D Deficiency Improved to 41. On supplement daily 2k   OSA, on CPAP - Patient reports prior history of dx OSA and on CPAP since Sept 2022, he had initial sleep study 02/2021, followed by [facility]. Mostly doing well with it but occasional nights can wake up early due to mask - Overall doing well more rested during daytime, less daytime sleepiness and fatigue. - Today reports that sleep apnea is well controlled. He uses the CPAP machine every night. Tolerates the machine well, and thinks  that sleeps better with it and feels good. No new concerns or symptoms.  Paroxysmal Atrial Fibrillation Followed by Cardiology On Aspirin. Off beta blocker intolerance due to low BP.  Additional topic  Bilateral Ear Dry Itchy skin. Some ear wax.  History of Melanoma - Dermatology - followed by Dr Gwen Pounds, surveillance   Health Maintenance: Updated Flu Shot today  Updated Colonoscopy 03/20/21, repeat 7 years, 2029.   PSA 0.98 (10/2023) negative, previously had mild elevation 1.32 (previously 0.89-0.97)     11/11/2023    8:29 AM 06/02/2023    8:24 AM 12/31/2022    8:09 AM  Depression screen PHQ 2/9  Decreased Interest 0 0 0  Down, Depressed, Hopeless 0 0 0  PHQ - 2 Score 0 0 0  Altered sleeping   1  Tired, decreased energy   1  Change in appetite   0  Feeling bad or failure about yourself    0  Trouble concentrating   0  Moving slowly or fidgety/restless   0  Suicidal thoughts   0  PHQ-9 Score   2  Difficult doing work/chores   Not difficult at all    Past Medical History:  Diagnosis Date   Arthritis    Dysplastic nevus 01/30/2022   R sup lat scapula superior, mod to severe   Dysplastic nevus 01/30/2022   R sup lat scapula inferior, mod to severe, shave removal 05/01/2022   Dysplastic nevus 05/01/2022   Right pectoral above areola, moderate atypia   Dysplastic nevus 05/01/2022   Left ant thigh, moderate atypia  Dysplastic nevus 12/05/2022   left sup scapula medial - moderate   Dysplastic nevus 12/05/2022   L sup med scapula lat - moderate   Hyperlipemia    Hypertension    Melanoma in situ (HCC) 01/24/2022   right lateral neck, mohs completed 04/05/2022   Past Surgical History:  Procedure Laterality Date   CHOLECYSTECTOMY  10/06/2006   COLONOSCOPY WITH PROPOFOL N/A 03/17/2018   Procedure: COLONOSCOPY WITH PROPOFOL;  Surgeon: Wyline Mood, MD;  Location: [facility] ENDOSCOPY;  Service: Gastroenterology;  Laterality: N/A;   COLONOSCOPY WITH PROPOFOL N/A 03/20/2021    Procedure: COLONOSCOPY WITH PROPOFOL;  Surgeon: Wyline Mood, MD;  Location: [facility] ENDOSCOPY;  Service: Gastroenterology;  Laterality: N/A;   KNEE ARTHROSCOPY Right 02/27/2017   Procedure: right knee arthroscopy, partial medial and lateral menisectomy;  Surgeon: Kennedy Bucker, MD;  Location: [facility] ORS;  Service: Orthopedics;  Laterality: Right;   Social History   Socioeconomic History   Marital status: Married    Spouse name: Not on file   Number of children: Not on file   Years of education: High School   Highest education level: 12th grade  Occupational History   Occupation: Passenger transport manager  Tobacco Use   Smoking status: Never   Smokeless tobacco: Former    Types: Chew, Snuff    Quit date: 12/31/1991   Tobacco comments:    pt quit chewing tobacco 01/11/2006  Vaping Use   Vaping status: Never Used  Substance and Sexual Activity   Alcohol use: Not Currently    Comment: occasionally, 1 per week   Drug use: No   Sexual activity: Not on file  Other Topics Concern   Not on file  Social History Narrative   Not on file   Social Determinants of Health   Financial Resource Strain: Patient Declined (05/29/2023)   Overall Financial Resource Strain (CARDIA)    Difficulty of Paying Living Expenses: Patient declined  Food Insecurity: Patient Declined (05/29/2023)   Hunger Vital Sign    Worried About Running Out of Food in the Last Year: Patient declined    Ran Out of Food in the Last Year: Patient declined  Transportation Needs: No Transportation Needs (05/29/2023)   PRAPARE - Administrator, Civil Service (Medical): No    Lack of Transportation (Non-Medical): No  Physical Activity: Unknown (05/29/2023)   Exercise Vital Sign    Days of Exercise per Week: Patient declined    Minutes of Exercise per Session: Not on file  Stress: No Stress Concern Present (05/29/2023)   [employer] of Occupational Health - Occupational Stress Questionnaire    Feeling of Stress : Not at all   Social Connections: Unknown (05/29/2023)   Social Connection and Isolation Panel [NHANES]    Frequency of Communication with Friends and Family: Patient declined    Frequency of Social Gatherings with Friends and Family: Patient declined    Attends Religious Services: Patient declined    Database administrator or Organizations: Patient declined    Attends Engineer, structural: Not on file    Marital Status: Married  Catering manager Violence: Not on file   Family History  Problem Relation Age of Onset   Heart disease Mother    Heart failure Mother    Stroke Maternal Grandfather    Current Outpatient Medications on File Prior to Visit  Medication Sig   amLODipine (NORVASC) 5 MG tablet Take 5 mg by mouth daily.   aspirin EC 81 MG tablet Take 81 mg  by mouth at bedtime.    azelastine (ASTELIN) 0.1 % nasal spray Place into both nostrils 2 (two) times daily. Use in each nostril as directed   Cholecalciferol (D3 PO) Take 2,000 Int'l Units by mouth daily.   Cyanocobalamin (B-12 PO) Take 1,000 mcg by mouth daily at 6 (six) AM.   Multiple Vitamins-Minerals (CENTRUM SILVER 50+MEN PO) Take by mouth.   rosuvastatin (CRESTOR) 10 MG tablet TAKE 1 TABLET AT BEDTIME   No current facility-administered medications on file prior to visit.    Review of Systems  Constitutional:  Negative for activity change, appetite change, chills, diaphoresis, fatigue and fever.  HENT:  Negative for congestion and hearing loss.   Eyes:  Negative for visual disturbance.  Respiratory:  Negative for cough, chest tightness, shortness of breath and wheezing.   Cardiovascular:  Negative for chest pain, palpitations and leg swelling.  Gastrointestinal:  Negative for abdominal pain, constipation, diarrhea, nausea and vomiting.  Genitourinary:  Negative for dysuria, frequency and hematuria.  Musculoskeletal:  Negative for arthralgias and neck pain.  Skin:  Negative for rash.  Neurological:  Negative for  dizziness, weakness, light-headedness, numbness and headaches.  Hematological:  Negative for adenopathy.  Psychiatric/Behavioral:  Negative for behavioral problems, dysphoric mood and sleep disturbance.    Per HPI unless specifically indicated above      Objective:    BP 124/70 (BP Location: Left Arm, Cuff Size: Large)   Ht 5\' 10"  (1.778 m)   Wt 210 lb (95.3 kg)   BMI 30.13 kg/m   Wt Readings from Last 3 Encounters:  11/11/23 210 lb (95.3 kg)  06/02/23 215 lb (97.5 kg)  12/31/22 244 lb (110.7 kg)    Physical Exam Vitals and nursing note reviewed.  Constitutional:      General: He is not in acute distress.    Appearance: He is well-developed. He is not diaphoretic.     Comments: Well-appearing, comfortable, cooperative  HENT:     Head: Normocephalic and atraumatic.     Comments: Bilateral R>L external ear opening to ear canal has mild flaky skin possible eczema dermatitis. R>L with some mild cerumen not impacted. flaky    Right Ear: Tympanic membrane normal. There is no impacted cerumen.     Left Ear: Tympanic membrane normal. There is no impacted cerumen.     Ears:     Comments: External ear canal with some dry flaky skin. Inner canal with slight dry skin flaky R >L but not significant, some mild wax debris without impaction Eyes:     General:        Right eye: No discharge.        Left eye: No discharge.     Conjunctiva/sclera: Conjunctivae normal.     Pupils: Pupils are equal, round, and reactive to light.  Neck:     Thyroid: No thyromegaly.     Vascular: No carotid bruit.  Cardiovascular:     Rate and Rhythm: Normal rate and regular rhythm.     Pulses: Normal pulses.     Heart sounds: Normal heart sounds. No murmur heard. Pulmonary:     Effort: Pulmonary effort is normal. No respiratory distress.     Breath sounds: Normal breath sounds. No wheezing or rales.  Abdominal:     General: Bowel sounds are normal. There is no distension.     Palpations: Abdomen is soft.  There is no mass.     Tenderness: There is no abdominal tenderness.  Musculoskeletal:  General: No tenderness. Normal range of motion.     Cervical back: Normal range of motion and neck supple.     Right lower leg: No edema.     Left lower leg: No edema.     Comments: Upper / Lower Extremities: - Normal muscle tone, strength bilateral upper extremities 5/5, lower extremities 5/5  Lymphadenopathy:     Cervical: No cervical adenopathy.  Skin:    General: Skin is warm and dry.     Findings: No erythema or rash.  Neurological:     Mental Status: He is alert and oriented to person, place, and time.     Comments: Distal sensation intact to light touch all extremities  Psychiatric:        Mood and Affect: Mood normal.        Behavior: Behavior normal.        Thought Content: Thought content normal.     Comments: Well groomed, good eye contact, normal speech and thoughts     Results for orders placed or performed in visit on 11/03/23  VITAMIN D 25 Hydroxy (Vit-D Deficiency, Fractures)  Result Value Ref Range   Vit D, 25-Hydroxy 41 30 - 100 ng/mL  TSH  Result Value Ref Range   TSH 1.28 0.40 - 4.50 mIU/L  PSA  Result Value Ref Range   PSA 0.98 < OR = 4.00 ng/mL  Hemoglobin A1c  Result Value Ref Range   Hgb A1c MFr Bld 5.5 <5.7 % of total Hgb   Mean Plasma Glucose 111 mg/dL   eAG (mmol/L) 6.2 mmol/L  Lipid panel  Result Value Ref Range   Cholesterol 142 <200 mg/dL   HDL 53 > OR = 40 mg/dL   Triglycerides 73 <284 mg/dL   LDL Cholesterol (Calc) 74 mg/dL (calc)   Total CHOL/HDL Ratio 2.7 <5.0 (calc)   Non-HDL Cholesterol (Calc) 89 <132 mg/dL (calc)  CBC with Differential/Platelet  Result Value Ref Range   WBC 7.5 3.8 - 10.8 Thousand/uL   RBC 5.61 4.20 - 5.80 Million/uL   Hemoglobin 16.8 13.2 - 17.1 g/dL   HCT 44.0 (H) 10.2 - 72.5 %   MCV 90.7 80.0 - 100.0 fL   MCH 29.9 27.0 - 33.0 pg   MCHC 33.0 32.0 - 36.0 g/dL   RDW 36.6 44.0 - 34.7 %   Platelets 303 140 - 400  Thousand/uL   MPV 11.1 7.5 - 12.5 fL   Neutro Abs 5,040 1,500 - 7,800 cells/uL   Absolute Lymphocytes 1,275 850 - 3,900 cells/uL   Absolute Monocytes 788 200 - 950 cells/uL   Eosinophils Absolute 330 15 - 500 cells/uL   Basophils Absolute 68 0 - 200 cells/uL   Neutrophils Relative % 67.2 %   Total Lymphocyte 17.0 %   Monocytes Relative 10.5 %   Eosinophils Relative 4.4 %   Basophils Relative 0.9 %  COMPLETE METABOLIC PANEL WITH GFR  Result Value Ref Range   Glucose, Bld 103 (H) 65 - 99 mg/dL   BUN 13 7 - 25 mg/dL   Creat 4.25 9.56 - 3.87 mg/dL   eGFR 564 > OR = 60 PP/IRJ/1.88C1   BUN/Creatinine Ratio SEE NOTE: 6 - 22 (calc)   Sodium 141 135 - 146 mmol/L   Potassium 4.1 3.5 - 5.3 mmol/L   Chloride 104 98 - 110 mmol/L   CO2 30 20 - 32 mmol/L   Calcium 9.9 8.6 - 10.3 mg/dL   Total Protein 7.1 6.1 - 8.1 g/dL   Albumin  4.5 3.6 - 5.1 g/dL   Globulin 2.6 1.9 - 3.7 g/dL (calc)   AG Ratio 1.7 1.0 - 2.5 (calc)   Total Bilirubin 0.5 0.2 - 1.2 mg/dL   Alkaline phosphatase (APISO) 61 35 - 144 U/L   AST 19 10 - 35 U/L   ALT 24 9 - 46 U/L      Assessment & Plan:   Problem List Items Addressed This Visit     Elevated hemoglobin A1c    Controlled A1c 5.5 Below PreDM range Continue lifestyle modification weight loss      Essential hypertension    Controlled BP OFF ACEi (cough) Complication with Systolic CHF NICM, AFib Followed by [facility] Dr Juliann Pares Off Carvedilol 3.125, Losartan 25mg    Plan:  Continues on Amlodipine 5mg  daily - future reconsider dose adjust to 2.5mg  dosing given his wt loss 2. Encourage improved lifestyle - low sodium diet, regular exercise - keep up maintaining weight loss 3. Continue monitor BP outside office, bring readings to next visit, if persistently >140/90 or new symptoms notify office sooner      Gastroesophageal reflux disease without esophagitis    Increased GERD symptoms on 20mg  dose Re order 40mg  dose Omeprazole      Relevant  Medications   omeprazole (PRILOSEC) 40 MG capsule   History of melanoma    Followed by Dermatology - [facility] Dr Gwen Pounds      Obesity (BMI 30.0-34.9)   OSA on CPAP    Well controlled, chronic OSA on CPAP - Good adherence to CPAP nightly - Continue current CPAP therapy, patient seems to be benefiting from therapy       Paroxysmal atrial fibrillation (HCC)    Stable, without active AFib Followed by Dr Juliann Pares On ASA, off beta blocker due to intolerance w/ low BP      Pure hypercholesterolemia    Controlled, improved on lab The 10-year ASCVD risk score (Arnett DK, et al., 2019) is: 6.7%  Plan: 1. Continue Rosuvastatin 10mg  nightly 2. Continue ASA 81mg   3. Encourage improved lifestyle - low carb/cholesterol, reduce portion size, continue improving regular exercise      Other Visit Diagnoses     Annual physical exam    -  Primary   Needs flu shot       Relevant Orders   Flu vaccine trivalent PF, 6mos and older(Flulaval,Afluria,Fluarix,Fluzone) (Completed)       Updated Health Maintenance information Reviewed recent lab results with patient Encouraged improvement to lifestyle with diet and exercise Goal of weight loss  Bilateral Ear Dryness and Itching Noted dry, flaky skin on the external ear canal. No signs of infection or significant wax buildup. Possible eczema No ear canal drops needed at this time, seems limited to only outer opening -Apply over-the-counter cortisone cream to the external ear areas as needed for itching.  Obesity BMI >30 Weight Loss Significant weight loss of 35 pounds over the past 10 months through dietary changes. Currently maintaining weight with lifestyle modification   General Health Maintenance / Followup Plans -Flu shot updated today. -COVID booster available at the pharmacy if desired.  -Next annual check-up in 1 year with fasting labs the week before.      Orders Placed This Encounter  Procedures   Flu vaccine  trivalent PF, 6mos and older(Flulaval,Afluria,Fluarix,Fluzone)     Meds ordered this encounter  Medications   omeprazole (PRILOSEC) 40 MG capsule    Sig: Take 1 capsule (40 mg total) by mouth daily before breakfast.  Dispense:  90 capsule    Refill:  3    Dose increase from 20 to 40      Follow up plan: Return in about 1 year (around 11/10/2024) for 1 year fasting lab > 1 week later Annual Physical (prefer AM apt).  Future labs ordered for 11/10/24  Saralyn Pilar, DO [facility] 11/11/2023, 8:16 AM

## 2023-11-11 NOTE — Assessment & Plan Note (Addendum)
Controlled, improved on lab The 10-year ASCVD risk score (Arnett DK, et al., 2019) is: 6.7%  Plan: 1. Continue Rosuvastatin 10mg  nightly 2. Continue ASA 81mg   3. Encourage improved lifestyle - low carb/cholesterol, reduce portion size, continue improving regular exercise

## 2023-11-11 NOTE — Assessment & Plan Note (Signed)
Stable, without active AFib Followed by Dr Juliann Pares On ASA, off beta blocker due to intolerance w/ low BP

## 2023-11-11 NOTE — Assessment & Plan Note (Signed)
Well controlled, chronic OSA on CPAP - Good adherence to CPAP nightly - Continue current CPAP therapy, patient seems to be benefiting from therapy  

## 2023-11-11 NOTE — Assessment & Plan Note (Signed)
Controlled BP OFF ACEi (cough) Complication with Systolic CHF NICM, AFib Followed by [facility] Cardiology Dr Juliann Pares Off Carvedilol 3.125, Losartan 25mg    Plan:  Continues on Amlodipine 5mg  daily - future reconsider dose adjust to 2.5mg  dosing given his wt loss 2. Encourage improved lifestyle - low sodium diet, regular exercise - keep up maintaining weight loss 3. Continue monitor BP outside office, bring readings to next visit, if persistently >140/90 or new symptoms notify office sooner

## 2023-11-16 DIAGNOSIS — G4733 Obstructive sleep apnea (adult) (pediatric): Secondary | ICD-10-CM | POA: Diagnosis not present

## 2023-12-09 ENCOUNTER — Encounter: Payer: Self-pay | Admitting: Dermatology

## 2023-12-09 ENCOUNTER — Ambulatory Visit: Payer: BC Managed Care – PPO | Admitting: Dermatology

## 2023-12-09 DIAGNOSIS — Z1283 Encounter for screening for malignant neoplasm of skin: Secondary | ICD-10-CM | POA: Diagnosis not present

## 2023-12-09 DIAGNOSIS — L578 Other skin changes due to chronic exposure to nonionizing radiation: Secondary | ICD-10-CM | POA: Diagnosis not present

## 2023-12-09 DIAGNOSIS — L858 Other specified epidermal thickening: Secondary | ICD-10-CM

## 2023-12-09 DIAGNOSIS — L814 Other melanin hyperpigmentation: Secondary | ICD-10-CM | POA: Diagnosis not present

## 2023-12-09 DIAGNOSIS — Z7189 Other specified counseling: Secondary | ICD-10-CM

## 2023-12-09 DIAGNOSIS — L821 Other seborrheic keratosis: Secondary | ICD-10-CM

## 2023-12-09 DIAGNOSIS — D2271 Melanocytic nevi of right lower limb, including hip: Secondary | ICD-10-CM

## 2023-12-09 DIAGNOSIS — Z85820 Personal history of malignant melanoma of skin: Secondary | ICD-10-CM

## 2023-12-09 DIAGNOSIS — L729 Follicular cyst of the skin and subcutaneous tissue, unspecified: Secondary | ICD-10-CM

## 2023-12-09 DIAGNOSIS — D229 Melanocytic nevi, unspecified: Secondary | ICD-10-CM

## 2023-12-09 DIAGNOSIS — Z86006 Personal history of melanoma in-situ: Secondary | ICD-10-CM

## 2023-12-09 DIAGNOSIS — Z86018 Personal history of other benign neoplasm: Secondary | ICD-10-CM

## 2023-12-09 DIAGNOSIS — W908XXA Exposure to other nonionizing radiation, initial encounter: Secondary | ICD-10-CM

## 2023-12-09 DIAGNOSIS — L720 Epidermal cyst: Secondary | ICD-10-CM

## 2023-12-09 NOTE — Progress Notes (Signed)
Follow-Up Visit   Subjective  Vincent Clements is a 62 y.o. male who presents for the following: Skin Cancer Screening and Full Body Skin Exam  The patient presents for Total-Body Skin Exam (TBSE) for skin cancer screening and mole check. The patient has spots, moles and lesions to be evaluated, some may be new or changing and the patient may have concern these could be cancer.    The following portions of the chart were reviewed this encounter and updated as appropriate: medications, allergies, medical history  Review of Systems:  No other skin or systemic complaints except as noted in HPI or Assessment and Plan.  Objective  Well appearing patient in no apparent distress; mood and affect are within normal limits.  A full examination was performed including scalp, head, eyes, ears, nose, lips, neck, chest, axillae, abdomen, back, buttocks, bilateral upper extremities, bilateral lower extremities, hands, feet, fingers, toes, fingernails, and toenails. All findings within normal limits unless otherwise noted below.   Relevant physical exam findings are noted in the Assessment and Plan.    Assessment & Plan   SKIN CANCER SCREENING PERFORMED TODAY.  ACTINIC DAMAGE - Chronic condition, secondary to cumulative UV/sun exposure - diffuse scaly erythematous macules with underlying dyspigmentation - Recommend daily broad spectrum sunscreen SPF 30+ to sun-exposed areas, reapply every 2 hours as needed.  - Staying in the shade or wearing long sleeves, sun glasses (UVA+UVB protection) and wide brim hats (4-inch brim around the entire circumference of the hat) are also recommended for sun protection.  - Call for new or changing lesions.  LENTIGINES, SEBORRHEIC KERATOSES, HEMANGIOMAS - Benign normal skin lesions - Benign-appearing - Call for any changes  MELANOCYTIC NEVI - Tan-brown and/or pink-flesh-colored symmetric macules and papules - Benign appearing on exam today - Observation - Call  clinic for new or changing moles - Recommend daily use of broad spectrum spf 30+ sunscreen to sun-exposed areas.  - R platnar foot mid sole 0.5cm brown macule, stable compared to photo/measurement  HISTORY OF MELANOMA IN SITU - No evidence of recurrence today - Recommend regular full body skin exams - Recommend daily broad spectrum sunscreen SPF 30+ to sun-exposed areas, reapply every 2 hours as needed.  - Call if any new or changing lesions are noted between office visits  - R lat neck, mohs 04/05/2022  HISTORY OF DYSPLASTIC NEVUS No evidence of recurrence today Recommend regular full body skin exams Recommend daily broad spectrum sunscreen SPF 30+ to sun-exposed areas, reapply every 2 hours as needed.  Call if any new or changing lesions are noted between office visits  - R superior lateral scapula superior, R superior lateral scapula inferior, R pectoral above areola, L anterior thigh, L superior scapula medial, L superior medial scapula lateral  Milia R cheek - tiny firm white papules - type of cyst - benign - sometimes these will clear with nightly OTC adapalene/Differin 0.1% gel or retinol. - may be extracted if symptomatic - observe   KERATOSIS PILARIS Back, triceps - Tiny follicular keratotic papules - Benign. Genetic in nature. No cure. - Observe. - If desired, patient can use an emollient (moisturizer) containing ammonium lactate (AmLactin), urea or salicylic acid once a day to smooth the area  Recommend starting moisturizer with exfoliant (Urea, Salicylic acid, or Lactic acid) one to two times daily to help smooth rough and bumpy skin.  OTC options include Cetaphil Rough and Bumpy lotion (Urea), Eucerin Roughness Relief lotion or spot treatment cream (Urea), CeraVe SA lotion/cream  for Rough and Bumpy skin (Sal Acid), Gold Bond Rough and Bumpy cream (Sal Acid), and AmLactin 12% lotion/cream (Lactic Acid).  If applying in morning, also apply sunscreen to sun-exposed areas,  since these exfoliating moisturizers can increase sensitivity to sun.    EPIDERMAL INCLUSION CYST R post waistline back Exam: Subcutaneous nodule at R post waistline back, 1.2cm  Benign-appearing. Exam most consistent with an epidermal inclusion cyst. Discussed that a cyst is a benign growth that can grow over time and sometimes get irritated or inflamed. Recommend observation if it is not bothersome. Discussed option of surgical excision to remove it if it is growing, symptomatic, or other changes noted. Please call for new or changing lesions so they can be evaluated.     Return in about 4 months (around 04/08/2024) for Hx of Melanoma IS, Hx of Dysplastic nevi.  I, Ardis Rowan, RMA, am acting as scribe for Armida Sans, MD .   Documentation: I have reviewed the above documentation for accuracy and completeness, and I agree with the above.  Armida Sans, MD

## 2023-12-09 NOTE — Patient Instructions (Addendum)
Amlactin for back, and tricep areas   Due to recent changes in healthcare laws, you may see results of your pathology and/or laboratory studies on MyChart before the doctors have had a chance to review them. We understand that in some cases there may be results that are confusing or concerning to you. Please understand that not all results are received at the same time and often the doctors may need to interpret multiple results in order to provide you with the best plan of care or course of treatment. Therefore, we ask that you please give [place] 2 business days to thoroughly review all your results before contacting the office for clarification. Should we see a critical lab result, you will be contacted sooner.   If You Need Anything After Your Visit  If you have any questions or concerns for your doctor, please call our main line at [phone] and press option 4 to reach your doctor's medical assistant. If no one answers, please leave a voicemail as directed and we will return your call as soon as possible. Messages left after 4 pm will be answered the following business day.   You may also send [place] a message via MyChart. We typically respond to MyChart messages within 1-2 business days.  For prescription refills, please ask your pharmacy to contact our office. Our fax number is [phone].  If you have an urgent issue when the clinic is closed that cannot wait until the next business day, you can page your doctor at the number below.    Please note that while we do our best to be available for urgent issues outside of office hours, we are not available 24/7.   If you have an urgent issue and are unable to reach [place], you may choose to seek medical care at your doctor's office, retail clinic, urgent care center, or emergency room.  If you have a medical emergency, please immediately call 911 or go to the emergency department.  Pager Numbers  - Dr. Gwen Pounds: [phone]  - Dr. Roseanne Reno:  [phone]  - Dr. Katrinka Blazing: [phone]   In the event of inclement weather, please call our main line at [phone] for an update on the status of any delays or closures.  Dermatology Medication Tips: Please keep the boxes that topical medications come in in order to help keep track of the instructions about where and how to use these. Pharmacies typically print the medication instructions only on the boxes and not directly on the medication tubes.   If your medication is too expensive, please contact our office at [phone] option 4 or send [place] a message through MyChart.   We are unable to tell what your co-pay for medications will be in advance as this is different depending on your insurance coverage. However, we may be able to find a substitute medication at lower cost or fill out paperwork to get insurance to cover a needed medication.   If a prior authorization is required to get your medication covered by your insurance company, please allow [place] 1-2 business days to complete this process.  Drug prices often vary depending on where the prescription is filled and some pharmacies may offer cheaper prices.  The website www.goodrx.com contains coupons for medications through different pharmacies. The prices here do not account for what the cost may be with help from insurance (it may be cheaper with your insurance), but the website can give you the price if you did not use any insurance.  - You  can print the associated coupon and take it with your prescription to the pharmacy.  - You may also stop by our office during regular business hours and pick up a GoodRx coupon card.  - If you need your prescription sent electronically to a different pharmacy, notify our office through [facility] or by phone at [phone] option 4.     Si Usted Necesita Algo Despus de Su Visita  Tambin puede enviarnos un mensaje a travs de Clinical cytogeneticist. Por lo general respondemos a los mensajes de  MyChart en el transcurso de 1 a 2 das hbiles.  Para renovar recetas, por favor pida a su farmacia que se ponga en contacto con nuestra oficina. Annie Sable de fax es [place] [phone].  Si tiene un asunto urgente cuando la clnica est cerrada y que no puede esperar hasta el siguiente da hbil, puede llamar/localizar a su doctor(a) al nmero que aparece a continuacin.   Por favor, tenga en cuenta que aunque hacemos todo lo posible para estar disponibles para asuntos urgentes fuera del horario de [place], no estamos disponibles las 24 horas del da, los 7 [address]  [address] de la [place].   Si tiene un problema urgente y no puede comunicarse con nosotros, puede optar por buscar atencin mdica  en el consultorio de su doctor(a), en una clnica privada, en un centro de atencin urgente o en una sala de emergencias.  Si tiene Engineer, drilling, por favor llame inmediatamente al 911 o vaya a la sala de emergencias.  Nmeros de bper  - Dr. Gwen Pounds: [phone]  - Dra. Roseanne Reno: [phone]  - Dr. Katrinka Blazing: [phone]   En caso de inclemencias del tiempo, por favor llame a Lacy Duverney principal al [phone] para una actualizacin sobre el [place] de cualquier retraso o cierre.  Consejos para la medicacin en dermatologa: Por favor, guarde las cajas en las que vienen los medicamentos de uso tpico para ayudarle a seguir las instrucciones sobre dnde y cmo usarlos. Las farmacias generalmente imprimen las instrucciones del medicamento slo en las cajas y no directamente en los tubos del [place].   Si su medicamento es muy caro, por favor, pngase en contacto con Rolm Gala llamando al [phone] y presione la opcin 4 o envenos un mensaje a travs de Clinical cytogeneticist.   No podemos decirle cul ser su copago por los medicamentos por adelantado ya que esto es diferente dependiendo de la cobertura de su seguro. Sin embargo, es posible que podamos encontrar un medicamento sustituto a Advice worker un formulario para que el seguro cubra el medicamento que se considera necesario.   Si se requiere una autorizacin previa para que su compaa de seguros [place] su medicamento, por favor permtanos de 1 a 2 das hbiles para completar [address]  Los precios de los medicamentos varan con frecuencia dependiendo del Environmental consultant de dnde se surte la receta y alguna farmacias pueden ofrecer precios ms baratos.  El sitio web www.goodrx.com tiene cupones para medicamentos de Health and safety inspector. Los precios aqu no tienen en cuenta lo que podra costar con la ayuda del seguro (puede ser ms barato con su seguro), pero el sitio web puede darle el precio si no utiliz Tourist information centre manager.  - Puede imprimir el cupn correspondiente y llevarlo con su receta a la farmacia.  - Tambin puede pasar por nuestra oficina durante el horario de atencin regular y Education officer, museum una tarjeta de cupones de GoodRx.  - Si necesita que su receta se enve electrnicamente a una farmacia diferente, informe  a nuestra oficina a travs de MyChart de [place] o por telfono llamando al [phone] y presione la opcin 4.

## 2023-12-18 DIAGNOSIS — G4733 Obstructive sleep apnea (adult) (pediatric): Secondary | ICD-10-CM | POA: Diagnosis not present

## 2024-01-18 DIAGNOSIS — G4733 Obstructive sleep apnea (adult) (pediatric): Secondary | ICD-10-CM | POA: Diagnosis not present

## 2024-03-15 ENCOUNTER — Other Ambulatory Visit: Payer: Self-pay | Admitting: Family Medicine

## 2024-03-15 DIAGNOSIS — K219 Gastro-esophageal reflux disease without esophagitis: Secondary | ICD-10-CM

## 2024-03-15 DIAGNOSIS — E7800 Pure hypercholesterolemia, unspecified: Secondary | ICD-10-CM

## 2024-03-15 NOTE — Telephone Encounter (Unsigned)
 Copied from CRM [id number]. Topic: Clinical - Medication Refill >> Mar 15, 2024  2:01 PM Alessandra Bevels wrote: Most Recent Primary Care Visit:  Provider: Smitty Cords  Department: [id number]  Visit Type: PHYSICAL 20  Date: 11/11/2023  Medication: rosuvastatin (CRESTOR) 10 MG tablet [[id number]] omeprazole (PRILOSEC) 40 MG capsule [[id number]]  amLODipine (NORVASC) 5 MG tablet [[id number]]  Has the patient contacted their pharmacy? Yes (Agent: If no, request that the patient contact the pharmacy for the refill. If patient does not wish to contact the pharmacy document the reason why and proceed with request.) (Agent: If yes, when and what did the pharmacy advise?)  Is this the correct pharmacy for this prescription? Yes If no, delete pharmacy and type the correct one.  This is the patient's preferred pharmacy:    [id number] - [place], [place] - [address] AT [facility] OF [address] [place] [address] [place] [address] Phone: [phone] Fax: [phone]   Has the prescription been filled recently? Yes  Is the patient out of the medication? Yes  Has the patient been seen for an appointment in the last year OR does the patient have an upcoming appointment? Yes  Can we respond through MyChart? Yes  Agent: Please be advised that Rx refills may take up to 3 business days. We ask that you follow-up with your pharmacy.

## 2024-03-16 NOTE — Telephone Encounter (Signed)
 Requested medications are due for refill today.  See note  Requested medications are on the active medications list.  yes  Last refill.   Future visit scheduled.   yes  Notes to clinic.  Amlodipine is historical. Pt is now taking 10mg  of Crestor and Omeprazole should not be due for re-fill.    Requested Prescriptions  Pending Prescriptions Disp Refills   amLODipine (NORVASC) 5 MG tablet      Sig: Take 1 tablet (5 mg total) by mouth daily.     Cardiovascular: Calcium Channel Blockers 2 Passed - 03/16/2024  5:10 PM      Passed - Last BP in normal range    BP Readings from Last 1 Encounters:  11/11/23 124/70         Passed - Last Heart Rate in normal range    Pulse Readings from Last 1 Encounters:  06/02/23 78         Passed - Valid encounter within last 6 months    Recent Outpatient Visits           4 months ago Annual physical exam   [place] [facility] Smitty Cords, DO   9 months ago Essential hypertension   [place] [facility] Smitty Cords, DO   1 year ago Pure hypercholesterolemia   [place] [facility] Smitty Cords, DO   1 year ago Annual physical exam   [place] [facility] Smitty Cords, DO   2 years ago Obesity (BMI 30.0-34.9)   [facility] Althea Charon, Netta Neat, DO       Future Appointments             In 2 months Deirdre Evener, MD [facility]   In 8 months Althea Charon, Netta Neat, DO [facility]             rosuvastatin (CRESTOR) 10 MG tablet 90 tablet 3    Sig: Take 1 tablet (10 mg total) by mouth at bedtime.     Cardiovascular:  Antilipid - Statins 2 Failed - 03/16/2024  5:10 PM      Failed - Lipid Panel in normal range within the last 12 months    Cholesterol  Date Value Ref Range Status  11/04/2023 142 <200 mg/dL Final   LDL  Cholesterol (Calc)  Date Value Ref Range Status  11/04/2023 74 mg/dL (calc) Final    Comment:    Reference range: <100 . Desirable range <100 mg/dL for primary prevention;   <70 mg/dL for patients with CHD or diabetic patients  with > or = 2 CHD risk factors. Marland Kitchen LDL-C is now calculated using the Martin-Hopkins  calculation, which is a validated novel method providing  better accuracy than the Friedewald equation in the  estimation of LDL-C.  Horald Pollen et al. Lenox Ahr. 1191;478(29): 2061-2068  (http://education.QuestDiagnostics.com/faq/FAQ164)    HDL  Date Value Ref Range Status  11/04/2023 53 > OR = 40 mg/dL Final   Triglycerides  Date Value Ref Range Status  11/04/2023 73 <150 mg/dL Final         Passed - Cr in normal range and within 360 days    Creat  Date Value Ref Range Status  11/04/2023 0.77 0.70 - 1.35 mg/dL Final         Passed - Patient is not pregnant      Passed -  Valid encounter within last 12 months    Recent Outpatient Visits           4 months ago Annual physical exam   [place] [facility] Smitty Cords, DO   9 months ago Essential hypertension   [place] [facility] Smitty Cords, DO   1 year ago Pure hypercholesterolemia   [place] [facility] Smitty Cords, DO   1 year ago Annual physical exam   [place] [facility] Smitty Cords, DO   2 years ago Obesity (BMI 30.0-34.9)   [place] [facility] Althea Charon, Netta Neat, DO       Future Appointments             In 2 months Deirdre Evener, MD [facility]   In 8 months Althea Charon, Netta Neat, DO [facility]             omeprazole (PRILOSEC) 40 MG capsule 90 capsule 3    Sig: Take 1 capsule (40 mg total) by mouth daily before breakfast.     Gastroenterology: Proton Pump Inhibitors Passed -  03/16/2024  5:10 PM      Passed - Valid encounter within last 12 months    Recent Outpatient Visits           4 months ago Annual physical exam   [place] Smitty Cords, DO   9 months ago Essential hypertension   [place] [facility] Smitty Cords, DO   1 year ago Pure hypercholesterolemia   [place] [facility] Smitty Cords, DO   1 year ago Annual physical exam   [place] [facility] Smitty Cords, DO   2 years ago Obesity (BMI 30.0-34.9)   [place] [facility] Althea Charon, Netta Neat, DO       Future Appointments             In 2 months Deirdre Evener, MD [facility]   In 8 months Althea Charon, Netta Neat, DO  [facility]

## 2024-03-17 MED ORDER — OMEPRAZOLE 40 MG PO CPDR: 40.0000 mg | DELAYED_RELEASE_CAPSULE | Freq: Every day | ORAL | 3 refills | Status: DC

## 2024-03-17 MED ORDER — AMLODIPINE BESYLATE 5 MG PO TABS: 5.0000 mg | ORAL_TABLET | Freq: Every day | ORAL | 0 refills | Status: DC

## 2024-03-17 MED ORDER — ROSUVASTATIN CALCIUM 10 MG PO TABS: 10.0000 mg | ORAL_TABLET | Freq: Every day | ORAL | 3 refills | Status: DC

## 2024-03-18 ENCOUNTER — Telehealth: Payer: Self-pay | Admitting: Family Medicine

## 2024-03-18 DIAGNOSIS — I10 Essential (primary) hypertension: Secondary | ICD-10-CM

## 2024-03-18 DIAGNOSIS — E7800 Pure hypercholesterolemia, unspecified: Secondary | ICD-10-CM

## 2024-03-18 DIAGNOSIS — K219 Gastro-esophageal reflux disease without esophagitis: Secondary | ICD-10-CM

## 2024-03-18 MED ORDER — OMEPRAZOLE 40 MG PO CPDR: 40.0000 mg | DELAYED_RELEASE_CAPSULE | Freq: Every day | ORAL | 1 refills | Status: DC

## 2024-03-18 MED ORDER — AMLODIPINE BESYLATE 5 MG PO TABS: 5.0000 mg | ORAL_TABLET | Freq: Every day | ORAL | 1 refills | Status: DC

## 2024-03-18 MED ORDER — ROSUVASTATIN CALCIUM 10 MG PO TABS: 10.0000 mg | ORAL_TABLET | Freq: Every day | ORAL | 1 refills | Status: DC

## 2024-03-18 NOTE — Telephone Encounter (Signed)
 Prescription Request  03/18/2024  LOV: 11/11/2023  What is the name of the medication or equipment? Omeprazole, rosuvastatin, amlodipine    Have you contacted your pharmacy to request a refill? No   Which pharmacy would you like this sent to?  [facility] - [place], [place] - [address] [place] [address] Phone: [phone] Fax: [phone]  [facility] [phone] [place], [place] - [address] AT [facility] OF [address] [facility] [address] [place] [address] Phone: [phone] Fax: [phone]    Patient notified that their request is being sent to the clinical staff for review and that they should receive a response within 2 business days.   Please advise at Mobile [phone] (mobile)

## 2024-03-18 NOTE — Addendum Note (Signed)
 Addended by: Smitty Cords on: 03/18/2024 11:51 AM   Modules accepted: Orders

## 2024-05-26 ENCOUNTER — Ambulatory Visit: Payer: BC Managed Care – PPO | Admitting: Dermatology

## 2024-05-26 ENCOUNTER — Encounter: Payer: Self-pay | Admitting: Dermatology

## 2024-05-26 DIAGNOSIS — W908XXA Exposure to other nonionizing radiation, initial encounter: Secondary | ICD-10-CM

## 2024-05-26 DIAGNOSIS — L738 Other specified follicular disorders: Secondary | ICD-10-CM

## 2024-05-26 DIAGNOSIS — D492 Neoplasm of unspecified behavior of bone, soft tissue, and skin: Secondary | ICD-10-CM

## 2024-05-26 DIAGNOSIS — D2362 Other benign neoplasm of skin of left upper limb, including shoulder: Secondary | ICD-10-CM

## 2024-05-26 DIAGNOSIS — D239 Other benign neoplasm of skin, unspecified: Secondary | ICD-10-CM

## 2024-05-26 DIAGNOSIS — D2261 Melanocytic nevi of right upper limb, including shoulder: Secondary | ICD-10-CM | POA: Diagnosis not present

## 2024-05-26 DIAGNOSIS — Z1283 Encounter for screening for malignant neoplasm of skin: Secondary | ICD-10-CM

## 2024-05-26 DIAGNOSIS — Z86018 Personal history of other benign neoplasm: Secondary | ICD-10-CM

## 2024-05-26 DIAGNOSIS — L578 Other skin changes due to chronic exposure to nonionizing radiation: Secondary | ICD-10-CM

## 2024-05-26 DIAGNOSIS — Z85820 Personal history of malignant melanoma of skin: Secondary | ICD-10-CM

## 2024-05-26 DIAGNOSIS — L814 Other melanin hyperpigmentation: Secondary | ICD-10-CM

## 2024-05-26 DIAGNOSIS — D229 Melanocytic nevi, unspecified: Secondary | ICD-10-CM

## 2024-05-26 DIAGNOSIS — L729 Follicular cyst of the skin and subcutaneous tissue, unspecified: Secondary | ICD-10-CM

## 2024-05-26 DIAGNOSIS — D2271 Melanocytic nevi of right lower limb, including hip: Secondary | ICD-10-CM

## 2024-05-26 DIAGNOSIS — L821 Other seborrheic keratosis: Secondary | ICD-10-CM

## 2024-05-26 DIAGNOSIS — L720 Epidermal cyst: Secondary | ICD-10-CM

## 2024-05-26 NOTE — Patient Instructions (Addendum)
 Wound Care Instructions  Cleanse wound gently with soap and water once a day then pat dry with clean gauze. Apply a thin coat of Petrolatum (petroleum jelly, "Vaseline") over the wound (unless you have an allergy to this). We recommend that you use a new, sterile tube of Vaseline. Do not pick or remove scabs. Do not remove the yellow or white "healing tissue" from the base of the wound.  Cover the wound with fresh, clean, nonstick gauze and secure with paper tape. You may use Band-Aids in place of gauze and tape if the wound is small enough, but would recommend trimming much of the tape off as there is often too much. Sometimes Band-Aids can irritate the skin.  You should call the office for your biopsy report after 1 week if you have not already been contacted.  If you experience any problems, such as abnormal amounts of bleeding, swelling, significant bruising, significant pain, or evidence of infection, please call the office immediately.  FOR ADULT SURGERY PATIENTS: If you need something for pain relief you may take 1 extra strength Tylenol (acetaminophen) AND 2 Ibuprofen (200mg  each) together every 4 hours as needed for pain. (do not take these if you are allergic to them or if you have a reason you should not take them.) Typically, you may only need pain medication for 1 to 3 days.       Recommend daily broad spectrum sunscreen SPF 30+ to sun-exposed areas, reapply every 2 hours as needed. Call for new or changing lesions.  Staying in the shade or wearing long sleeves, sun glasses (UVA+UVB protection) and wide brim hats (4-inch brim around the entire circumference of the hat) are also recommended for sun protection.    Melanoma ABCDEs  Melanoma is the most dangerous type of skin cancer, and is the leading cause of death from skin disease.  You are more likely to develop melanoma if you: Have light-colored skin, light-colored eyes, or red or blond hair Spend a lot of time in the sun Tan  regularly, either outdoors or in a tanning bed Have had blistering sunburns, especially during childhood Have a close family member who has had a melanoma Have atypical moles or large birthmarks  Early detection of melanoma is key since treatment is typically straightforward and cure rates are extremely high if we catch it early.   The first sign of melanoma is often a change in a mole or a new dark spot.  The ABCDE system is a way of remembering the signs of melanoma.  A for asymmetry:  The two halves do not match. B for border:  The edges of the growth are irregular. C for color:  A mixture of colors are present instead of an even brown color. D for diameter:  Melanomas are usually (but not always) greater than 6mm - the size of a pencil eraser. E for evolution:  The spot keeps changing in size, shape, and color.  Please check your skin once per month between visits. You can use a small mirror in front and a large mirror behind you to keep an eye on the back side or your body.   If you see any new or changing lesions before your next follow-up, please call to schedule a visit.  Please continue daily skin protection including broad spectrum sunscreen SPF 30+ to sun-exposed areas, reapplying every 2 hours as needed when you're outdoors.   Staying in the shade or wearing long sleeves, sun glasses (UVA+UVB protection) and  wide brim hats (4-inch brim around the entire circumference of the hat) are also recommended for sun protection.     Due to recent changes in healthcare laws, you may see results of your pathology and/or laboratory studies on MyChart before the doctors have had a chance to review them. We understand that in some cases there may be results that are confusing or concerning to you. Please understand that not all results are received at the same time and often the doctors may need to interpret multiple results in order to provide you with the best plan of care or course of  treatment. Therefore, we ask that you please give [place] 2 business days to thoroughly review all your results before contacting the office for clarification. Should we see a critical lab result, you will be contacted sooner.   If You Need Anything After Your Visit  If you have any questions or concerns for your doctor, please call our main line at [phone] and press option 4 to reach your doctor's medical assistant. If no one answers, please leave a voicemail as directed and we will return your call as soon as possible. Messages left after 4 pm will be answered the following business day.   You may also send [place] a message via MyChart. We typically respond to MyChart messages within 1-2 business days.  For prescription refills, please ask your pharmacy to contact our office. Our fax number is [phone].  If you have an urgent issue when the clinic is closed that cannot wait until the next business day, you can page your doctor at the number below.    Please note that while we do our best to be available for urgent issues outside of office hours, we are not available 24/7.   If you have an urgent issue and are unable to reach [place], you may choose to seek medical care at your doctor's office, retail clinic, urgent care center, or emergency room.  If you have a medical emergency, please immediately call 911 or go to the emergency department.  Pager Numbers  - Dr. Gwen Pounds: [phone]  - Dr. Roseanne Reno: [phone]  - Dr. Katrinka Blazing: [phone]   In the event of inclement weather, please call our main line at [phone] for an update on the status of any delays or closures.  Dermatology Medication Tips: Please keep the boxes that topical medications come in in order to help keep track of the instructions about where and how to use these. Pharmacies typically print the medication instructions only on the boxes and not directly on the medication tubes.   If your medication is too expensive,  please contact our office at [phone] option 4 or send [place] a message through MyChart.   We are unable to tell what your co-pay for medications will be in advance as this is different depending on your insurance coverage. However, we may be able to find a substitute medication at lower cost or fill out paperwork to get insurance to cover a needed medication.   If a prior authorization is required to get your medication covered by your insurance company, please allow [place] 1-2 business days to complete this process.  Drug prices often vary depending on where the prescription is filled and some pharmacies may offer cheaper prices.  The website www.goodrx.com contains coupons for medications through different pharmacies. The prices here do not account for what the cost may be with help from insurance (it may be cheaper with your insurance), but the website  can give you the price if you did not use any insurance.  - You can print the associated coupon and take it with your prescription to the pharmacy.  - You may also stop by our office during regular business hours and pick up a GoodRx coupon card.  - If you need your prescription sent electronically to a different pharmacy, notify our office through [facility] or by phone at [phone] option 4.     Si Usted Necesita Algo Despus de Su Visita  Tambin puede enviarnos un mensaje a travs de Clinical cytogeneticist. Por lo general respondemos a los mensajes de MyChart en el transcurso de 1 a 2 das hbiles.  Para renovar recetas, por favor pida a su farmacia que se ponga en contacto con nuestra oficina. Annie Sable de fax es [place] [phone].  Si tiene un asunto urgente cuando la clnica est cerrada y que no puede esperar hasta el siguiente da hbil, puede llamar/localizar a su doctor(a) al nmero que aparece a continuacin.   Por favor, tenga en cuenta que aunque hacemos todo lo posible para estar disponibles para asuntos urgentes fuera del  horario de [employer], no estamos disponibles las 24 horas del da, los 7 [address]  [address] de la [place].   Si tiene un problema urgente y no puede comunicarse con nosotros, puede optar por buscar atencin mdica  en el consultorio de su doctor(a), en una clnica privada, en un centro de atencin urgente o en una sala de emergencias.  Si tiene Engineer, drilling, por favor llame inmediatamente al 911 o vaya a la sala de emergencias.  Nmeros de bper  - Dr. Gwen Pounds: [phone]  - Dra. Roseanne Reno: [phone]  - Dr. Katrinka Blazing: [phone]   En caso de inclemencias del tiempo, por favor llame a Lacy Duverney principal al [phone] para una actualizacin sobre el [place] de cualquier retraso o cierre.  Consejos para la medicacin en dermatologa: Por favor, guarde las cajas en las que vienen los medicamentos de uso tpico para ayudarle a seguir las instrucciones sobre dnde y cmo usarlos. Las farmacias generalmente imprimen las instrucciones del medicamento slo en las cajas y no directamente en los tubos del [place].   Si su medicamento es muy caro, por favor, pngase en contacto con Rolm Gala llamando al [phone] y presione la opcin 4 o envenos un mensaje a travs de Clinical cytogeneticist.   No podemos decirle cul ser su copago por los medicamentos por adelantado ya que esto es diferente dependiendo de la cobertura de su seguro. Sin embargo, es posible que podamos encontrar un medicamento sustituto a Audiological scientist un formulario para que el seguro cubra el medicamento que se considera necesario.   Si se requiere una autorizacin previa para que su compaa de seguros [place] su medicamento, por favor permtanos de 1 a 2 das hbiles para completar [address]  Los precios de los medicamentos varan con frecuencia dependiendo del Environmental consultant de dnde se surte la receta y alguna farmacias pueden ofrecer precios ms baratos.  El sitio web www.goodrx.com tiene cupones para medicamentos de Engineer, civil (consulting). Los precios aqu no tienen en cuenta lo que podra costar con la ayuda del seguro (puede ser ms barato con su seguro), pero el sitio web puede darle el precio si no utiliz Tourist information centre manager.  - Puede imprimir el cupn correspondiente y llevarlo con su receta a la farmacia.  - Tambin puede pasar por nuestra oficina durante el horario de atencin regular y Education officer, museum una tarjeta de cupones de GoodRx.  -  Si necesita que su receta se enve electrnicamente a Psychiatrist, informe a nuestra oficina a travs de MyChart de [place] o por telfono llamando al [phone] y presione la opcin 4.

## 2024-05-26 NOTE — Progress Notes (Signed)
 Follow-Up Visit   Subjective  Vincent Clements is a 63 y.o. male who presents for the following: Skin Cancer Screening and Full Body Skin Exam. Hx of MIS. Hx of multiple dysplastic nevi.   The patient presents for Total-Body Skin Exam (TBSE) for skin cancer screening and mole check. The patient has spots, moles and lesions to be evaluated, some may be new or changing and the patient may have concern these could be cancer.   The following portions of the chart were reviewed this encounter and updated as appropriate: medications, allergies, medical history  Review of Systems:  No other skin or systemic complaints except as noted in HPI or Assessment and Plan.  Objective  Well appearing patient in no apparent distress; mood and affect are within normal limits.  A full examination was performed including scalp, head, eyes, ears, nose, lips, neck, chest, axillae, abdomen, back, buttocks, bilateral upper extremities, bilateral lower extremities, hands, feet, fingers, toes, fingernails, and toenails. All findings within normal limits unless otherwise noted below.   Relevant physical exam findings are noted in the Assessment and Plan.  Right middle medial dorsum forearm 1.5 cm x 1.0 cm Irregular brown macule    Assessment & Plan   SKIN CANCER SCREENING PERFORMED TODAY.  HISTORY OF MELANOMA IN SITU. Right lateral neck. Mohs 04/05/2022. - No evidence of recurrence today - No lymphadenopathy  - Recommend regular full body skin exams - Recommend daily broad spectrum sunscreen SPF 30+ to sun-exposed areas, reapply every 2 hours as needed.  - Call if any new or changing lesions are noted between office visits   HISTORY OF DYSPLASTIC NEVUS No evidence of recurrence today Recommend regular full body skin exams Recommend daily broad spectrum sunscreen SPF 30+ to sun-exposed areas, reapply every 2 hours as needed.  Call if any new or changing lesions are noted between office visits   ACTINIC  DAMAGE - Chronic condition, secondary to cumulative UV/sun exposure - diffuse scaly erythematous macules with underlying dyspigmentation - Recommend daily broad spectrum sunscreen SPF 30+ to sun-exposed areas, reapply every 2 hours as needed.  - Staying in the shade or wearing long sleeves, sun glasses (UVA+UVB protection) and wide brim hats (4-inch brim around the entire circumference of the hat) are also recommended for sun protection.  - Call for new or changing lesions.  LENTIGINES, SEBORRHEIC KERATOSES, HEMANGIOMAS - Benign normal skin lesions - Benign-appearing - Call for any changes  MELANOCYTIC NEVI - Tan-brown and/or pink-flesh-colored symmetric macules and papules - Benign appearing on exam today - Observation - Call clinic for new or changing moles - Recommend daily use of broad spectrum spf 30+ sunscreen to sun-exposed areas.  - R platnar foot mid sole 0.5cm brown macule, stable compared to photo/measurement    Sebaceous Hyperplasia - Small yellow papules with a central dell at forehead - Benign-appearing - Observe. Call for changes.   EPIDERMAL INCLUSION CYST Exam: 1.5 cm Subcutaneous nodule at right posterior waistline Benign-appearing. Exam most consistent with an epidermal inclusion cyst. Discussed that a cyst is a benign growth that can grow over time and sometimes get irritated or inflamed. Recommend observation if it is not bothersome. Discussed option of surgical excision to remove it if it is growing, symptomatic, or other changes noted. Please call for new or changing lesions so they can be evaluated.  DERMATOFIBROMA Exam: Firm pink/brown papulenodule with dimple sign at left wrist. Treatment Plan: A dermatofibroma is a benign growth possibly related to trauma, such as an insect  bite, cut from shaving, or inflamed acne-type bump.  Treatment options to remove include shave or excision with resulting scar and risk of recurrence.  Since benign-appearing and not  bothersome, will observe for now.    NEOPLASM OF SKIN Right middle medial dorsum forearm Epidermal / dermal shaving  Lesion diameter (cm):  1.5 Informed consent: discussed and consent obtained   Timeout: patient name, date of birth, surgical site, and procedure verified   Procedure prep:  Patient was prepped and draped in usual sterile fashion Prep type:  Isopropyl alcohol Anesthesia: the lesion was anesthetized in a standard fashion   Anesthetic:  1% lidocaine  w/ epinephrine  1-100,000 buffered w/ 8.4% NaHCO3 Instrument used: flexible razor blade   Hemostasis achieved with: pressure, aluminum chloride and electrodesiccation   Outcome: patient tolerated procedure well   Post-procedure details: sterile dressing applied and wound care instructions given   Dressing type: bandage and petrolatum   Specimen 1 - Surgical pathology Differential Diagnosis: R/O dysplastic nevus  Check Margins: Yes  Hx of MIS, Hx of multiple dysplastic nevi  Return in about 6 months (around 11/26/2024) for TBSE, HxMIS, HxDN.  I, Jill Parcell, CMA, am acting as scribe for Celine Collard, MD.   Documentation: I have reviewed the above documentation for accuracy and completeness, and I agree with the above.  Celine Collard, MD

## 2024-05-31 ENCOUNTER — Ambulatory Visit: Payer: Self-pay | Admitting: Dermatology

## 2024-05-31 LAB — SURGICAL PATHOLOGY

## 2024-06-01 ENCOUNTER — Encounter: Payer: Self-pay | Admitting: Dermatology

## 2024-06-01 NOTE — Telephone Encounter (Signed)
-----   Message from Celine Collard sent at 05/31/2024  7:51 PM EDT ----- FINAL DIAGNOSIS        1. Skin, right middle medial dorsum forearm :       DYSPLASTIC JUNCTIONAL NEVUS WITH SEVERE ATYPIA   Severe dysplastic Schedule surgery (since I am not doing surgery due to my recent neck surgery, may schedule with Dr Annette Barters or Dr Felipe Horton and if their schedules are booked, may schedule with Dr Fain Home.)

## 2024-06-01 NOTE — Telephone Encounter (Signed)
 Discussed biopsy results with patient, surgery scheduled with Dr Felipe Horton 06/09/2024  right middle medial dorsum forearm :       DYSPLASTIC JUNCTIONAL NEVUS WITH SEVERE ATYPIA

## 2024-06-09 ENCOUNTER — Ambulatory Visit: Admitting: Dermatology

## 2024-06-09 ENCOUNTER — Encounter: Payer: Self-pay | Admitting: Dermatology

## 2024-06-09 DIAGNOSIS — D239 Other benign neoplasm of skin, unspecified: Secondary | ICD-10-CM

## 2024-06-09 DIAGNOSIS — D2361 Other benign neoplasm of skin of right upper limb, including shoulder: Secondary | ICD-10-CM

## 2024-06-09 MED ORDER — MUPIROCIN 2 % EX OINT: 1.0000 | TOPICAL_OINTMENT | Freq: Every day | CUTANEOUS | 0 refills | Status: DC

## 2024-06-09 NOTE — Patient Instructions (Signed)

## 2024-06-09 NOTE — Progress Notes (Signed)
   Follow-Up Visit   Subjective  Vincent Clements is a 63 y.o. male who presents for the following: Excision of DYSPLASTIC JUNCTIONAL NEVUS WITH SEVERE ATYPIA   The following portions of the chart were reviewed this encounter and updated as appropriate: medications, allergies, medical history  Review of Systems:  No other skin or systemic complaints except as noted in HPI or Assessment and Plan.  Objective  Well appearing patient in no apparent distress; mood and affect are within normal limits.  A focused examination was performed of the following areas: Right arm Relevant physical exam findings are noted in the Assessment and Plan.   right middle medial dorsum forearm Pink bx site   Assessment & Plan   DYSPLASTIC NEVUS right middle medial dorsum forearm Skin excision - right middle medial dorsum forearm  Excision method:  elliptical Lesion length (cm):  1 Margin per side (cm):  0.4 Total excision diameter (cm):  1.8 Informed consent: discussed and consent obtained   Timeout: patient name, date of birth, surgical site, and procedure verified   Procedure prep:  Patient was prepped and draped in usual sterile fashion Prep type:  Chlorhexidine  Anesthesia: the lesion was anesthetized in a standard fashion   Anesthetic:  1% lidocaine  w/ epinephrine  1-100,000 buffered w/ 8.4% NaHCO3 (12 cc) Instrument used: #15 blade   Hemostasis achieved with: suture, pressure and electrodesiccation   Outcome: patient tolerated procedure well with no complications   Additional details:  Lateral tag  Skin repair - right middle medial dorsum forearm Complexity:  Intermediate Final length (cm):  6 Informed consent: discussed and consent obtained   Timeout: patient name, date of birth, surgical site, and procedure verified   Procedure prep:  Patient was prepped and draped in usual sterile fashion Prep type:  Chlorhexidine  Anesthesia: the lesion was anesthetized in a standard fashion    Anesthetic:  1% lidocaine  w/ epinephrine  1-100,000 buffered w/ 8.4% NaHCO3 Reason for type of repair: reduce tension to allow closure, reduce the risk of dehiscence, infection, and necrosis, reduce subcutaneous dead space and avoid a hematoma, allow closure of the large defect and preserve normal anatomy   Undermining: edges could be approximated without difficulty   Subcutaneous layers (deep stitches):  Suture size:  4-0 Suture type: Monocryl (poliglecaprone 25)   Stitches:  Buried vertical mattress Fine/surface layer approximation (top stitches):  Suture size:  5-0 Suture type: Prolene (polypropylene)   Stitches comment:  Running locked Suture removal (days):  7 Hemostasis achieved with: suture, pressure and electrodesiccation Outcome: patient tolerated procedure well with no complications   Post-procedure details: sterile dressing applied and wound care instructions given   Dressing type: petrolatum, bandage and pressure dressing   Specimen 1 - Surgical pathology Differential Diagnosis: BX proven DYSPLASTIC JUNCTIONAL NEVUS WITH SEVERE ATYPIA   Check Margins: yes [id number]  Lateral tag    Return in about 1 week (around 06/16/2024) for Suture Removal.  Kerstin Peeling, RMA, am acting as scribe for Harris Liming, MD .   Documentation: I have reviewed the above documentation for accuracy and completeness, and I agree with the above.  Harris Liming, MD

## 2024-06-14 ENCOUNTER — Ambulatory Visit: Payer: Self-pay | Admitting: Dermatology

## 2024-06-14 LAB — SURGICAL PATHOLOGY

## 2024-06-14 NOTE — Telephone Encounter (Signed)
-----   Message from [place] sent at 06/14/2024  4:47 PM EDT ----- Diagnosis right middle medial dorsum forearm :       NO RESIDUAL DYSPLASTIC NEVUS, MARGINS FREE   Please call to share that excision was clear of abnormal mole and get update on surgical wound. Thank you. ----- Message ----- From: Interface, Lab In [facility] [place] [facility] Sent: 06/14/2024   4:42 PM EDT To: Harris Liming, MD

## 2024-06-14 NOTE — Telephone Encounter (Signed)
Patient advised pathology from excision showed margins free. Lurlean Horns., RMA

## 2024-06-17 ENCOUNTER — Ambulatory Visit: Admitting: Dermatology

## 2024-06-17 ENCOUNTER — Encounter: Payer: Self-pay | Admitting: Dermatology

## 2024-06-17 DIAGNOSIS — Z5189 Encounter for other specified aftercare: Secondary | ICD-10-CM

## 2024-06-17 DIAGNOSIS — Z4802 Encounter for removal of sutures: Secondary | ICD-10-CM

## 2024-06-17 DIAGNOSIS — D2361 Other benign neoplasm of skin of right upper limb, including shoulder: Secondary | ICD-10-CM

## 2024-06-17 NOTE — Patient Instructions (Signed)

## 2024-06-17 NOTE — Progress Notes (Signed)
   Follow-Up Visit   Subjective  Vincent Clements is a 63 y.o. male who presents for the following: Suture removal  Pathology showed NO RESIDUAL DYSPLASTIC NEVUS, MARGINS FREE   The following portions of the chart were reviewed this encounter and updated as appropriate: medications, allergies, medical history  Review of Systems:  No other skin or systemic complaints except as noted in HPI or Assessment and Plan.  Objective  Well appearing patient in no apparent distress; mood and affect are within normal limits.  Areas Examined: right middle medial dorsum forearm  Relevant physical exam findings are noted in the Assessment and Plan.    Assessment & Plan   VISIT FOR WOUND CHECK   ENCOUNTER FOR REMOVAL OF SUTURES   Encounter for Removal of Sutures - Incision site is clean, dry and intact. - Wound cleansed, sutures removed, wound cleansed and steri strips applied.  - Discussed pathology results showing NO RESIDUAL DYSPLASTIC NEVUS, MARGINS FREE  - Patient advised to keep steri-strips dry until they fall off. - Scars remodel for a full year. - Once steri-strips fall off, patient can apply over-the-counter silicone scar cream once to twice a day to help with scar remodeling if desired. - Patient advised to call with any concerns or if they notice any new or changing lesions.  Return for TBSE, with Dr. Linnell Richardson, as scheduled, HxDN, HxMMis.  Kerstin Peeling, RMA, am acting as scribe for Harris Liming, MD .   Documentation: I have reviewed the above documentation for accuracy and completeness, and I agree with the above.  Harris Liming, MD

## 2024-09-29 ENCOUNTER — Other Ambulatory Visit: Payer: Self-pay | Admitting: Family Medicine

## 2024-09-29 DIAGNOSIS — K219 Gastro-esophageal reflux disease without esophagitis: Secondary | ICD-10-CM

## 2024-09-30 NOTE — Telephone Encounter (Signed)
 Requested Prescriptions  Pending Prescriptions Disp Refills   omeprazole  (PRILOSEC) 40 MG capsule [Pharmacy Med Name: OMEPRAZOLE  40MG  CAPSULES] 90 capsule 0    Sig: TAKE 1 CAPSULE(40 MG) BY MOUTH DAILY BEFORE BREAKFAST     Gastroenterology: Proton Pump Inhibitors Failed - 09/30/2024  3:25 PM      Failed - Valid encounter within last 12 months    Recent Outpatient Visits   None     Future Appointments             In 1 month Karamalegos, Marsa PARAS, DO [facility], [place]   In 2 months Hester Alm BROCKS, MD [facility]

## 2024-10-22 ENCOUNTER — Other Ambulatory Visit: Payer: Self-pay | Admitting: Family Medicine

## 2024-10-22 DIAGNOSIS — E7800 Pure hypercholesterolemia, unspecified: Secondary | ICD-10-CM

## 2024-10-22 DIAGNOSIS — I10 Essential (primary) hypertension: Secondary | ICD-10-CM

## 2024-10-25 NOTE — Telephone Encounter (Signed)
 Requested Prescriptions  Pending Prescriptions Disp Refills   amLODipine  (NORVASC ) 5 MG tablet [Pharmacy Med Name: AMLODIPINE  BESYLATE 5MG  TABLETS] 90 tablet 0    Sig: TAKE 1 TABLET(5 MG) BY MOUTH DAILY     Cardiovascular: Calcium  Channel Blockers 2 Failed - 10/25/2024  2:00 PM      Failed - Valid encounter within last 6 months    Recent Outpatient Visits   None     Future Appointments             In 3 weeks Edman, Marsa PARAS, DO [facility], [place]   In 1 month Hester Alm BROCKS, MD [place] [facility]            Passed - Last BP in normal range    BP Readings from Last 1 Encounters:  11/11/23 124/70         Passed - Last Heart Rate in normal range    Pulse Readings from Last 1 Encounters:  06/02/23 78          rosuvastatin  (CRESTOR ) 10 MG tablet [Pharmacy Med Name: ROSUVASTATIN  10MG  TABLETS] 90 tablet 0    Sig: TAKE 1 TABLET(10 MG) BY MOUTH AT BEDTIME     Cardiovascular:  Antilipid - Statins 2 Failed - 10/25/2024  2:00 PM      Failed - Valid encounter within last 12 months    Recent Outpatient Visits   None     Future Appointments             In 3 weeks Edman Marsa PARAS, DO [facility], [place]   In 1 month Hester Alm BROCKS, MD  [facility]            Failed - Lipid Panel in normal range within the last 12 months    Cholesterol  Date Value Ref Range Status  11/04/2023 142 <200 mg/dL Final   LDL Cholesterol (Calc)  Date Value Ref Range Status  11/04/2023 74 mg/dL (calc) Final    Comment:    Reference range: <100 . Desirable range <100 mg/dL for primary prevention;   <70 mg/dL for patients with CHD or diabetic patients  with > or = 2 CHD risk factors. SABRA LDL-C is now calculated using the Martin-Hopkins  calculation, which is a validated novel method providing  better accuracy than the Friedewald equation in the  estimation of LDL-C.  Gladis APPLETHWAITE et al. SANDREA. 7986;689(80): 2061-2068  (http://education.[facility].com/faq/FAQ164)    HDL  Date Value Ref Range Status  11/04/2023 53 > OR = 40 mg/dL Final   Triglycerides  Date Value Ref Range Status  11/04/2023 73 <150 mg/dL Final         Passed - Cr in normal range and within 360 days    Creat  Date Value Ref Range Status  11/04/2023 0.77 0.70 - 1.35 mg/dL Final         Passed - Patient is not pregnant

## 2024-11-10 ENCOUNTER — Other Ambulatory Visit: Payer: Self-pay

## 2024-11-10 DIAGNOSIS — Z0000 Encounter for general adult medical examination without abnormal findings: Secondary | ICD-10-CM

## 2024-11-10 DIAGNOSIS — E7800 Pure hypercholesterolemia, unspecified: Secondary | ICD-10-CM

## 2024-11-10 DIAGNOSIS — R7309 Other abnormal glucose: Secondary | ICD-10-CM

## 2024-11-10 DIAGNOSIS — I10 Essential (primary) hypertension: Secondary | ICD-10-CM

## 2024-11-10 DIAGNOSIS — Z125 Encounter for screening for malignant neoplasm of prostate: Secondary | ICD-10-CM

## 2024-11-10 DIAGNOSIS — R351 Nocturia: Secondary | ICD-10-CM

## 2024-11-10 DIAGNOSIS — E66811 Obesity, class 1: Secondary | ICD-10-CM

## 2024-11-10 DIAGNOSIS — G4733 Obstructive sleep apnea (adult) (pediatric): Secondary | ICD-10-CM

## 2024-11-10 DIAGNOSIS — E559 Vitamin D deficiency, unspecified: Secondary | ICD-10-CM

## 2024-11-11 LAB — LIPID PANEL
Cholesterol: 137 mg/dL (ref <200)
HDL: 48 mg/dL (ref >=40)
LDL Cholesterol (Calc): 70 mg/dL
Non-HDL Cholesterol (Calc): 89 mg/dL (ref <130)
Total CHOL/HDL Ratio: 2.9 (calc) (ref <5.0)
Triglycerides: 108 mg/dL (ref <150)

## 2024-11-11 LAB — CBC WITH DIFFERENTIAL/PLATELET
Absolute Lymphocytes: 1295 {cells}/uL (ref 850–3900)
Absolute Monocytes: 827 {cells}/uL (ref 200–950)
Basophils Absolute: 78 {cells}/uL (ref 0–200)
Basophils Relative: 1 %
Eosinophils Absolute: 281 {cells}/uL (ref 15–500)
Eosinophils Relative: 3.6 %
HCT: 52.8 % — ABNORMAL HIGH (ref 38.5–50.0)
Hemoglobin: 17.6 g/dL — ABNORMAL HIGH (ref 13.2–17.1)
MCH: 30 pg (ref 27.0–33.0)
MCHC: 33.3 g/dL (ref 32.0–36.0)
MCV: 90.1 fL (ref 80.0–100.0)
MPV: 10.9 fL (ref 7.5–12.5)
Monocytes Relative: 10.6 %
Neutro Abs: 5320 {cells}/uL (ref 1500–7800)
Neutrophils Relative %: 68.2 %
Platelets: 295 Thousand/uL (ref 140–400)
RBC: 5.86 Million/uL — ABNORMAL HIGH (ref 4.20–5.80)
RDW: 12.7 % (ref 11.0–15.0)
Total Lymphocyte: 16.6 %
WBC: 7.8 Thousand/uL (ref 3.8–10.8)

## 2024-11-11 LAB — COMPLETE METABOLIC PANEL WITHOUT GFR
AG Ratio: 2.1 (calc) (ref 1.0–2.5)
ALT: 33 U/L (ref 9–46)
AST: 23 U/L (ref 10–35)
Albumin: 4.7 g/dL (ref 3.6–5.1)
Alkaline phosphatase (APISO): 50 U/L (ref 35–144)
BUN: 16 mg/dL (ref 7–25)
CO2: 29 mmol/L (ref 20–32)
Calcium: 10 mg/dL (ref 8.6–10.3)
Chloride: 102 mmol/L (ref 98–110)
Creat: 0.73 mg/dL (ref 0.70–1.35)
Globulin: 2.2 g/dL (ref 1.9–3.7)
Glucose, Bld: 119 mg/dL — ABNORMAL HIGH (ref 65–99)
Potassium: 4.6 mmol/L (ref 3.5–5.3)
Sodium: 140 mmol/L (ref 135–146)
Total Bilirubin: 0.5 mg/dL (ref 0.2–1.2)
Total Protein: 6.9 g/dL (ref 6.1–8.1)

## 2024-11-11 LAB — HEMOGLOBIN A1C
Hgb A1c MFr Bld: 5.6 % (ref <5.7)
Mean Plasma Glucose: 114 mg/dL
eAG (mmol/L): 6.3 mmol/L

## 2024-11-11 LAB — VITAMIN D 25 HYDROXY (VIT D DEFICIENCY, FRACTURES): Vit D, 25-Hydroxy: 34 ng/mL (ref 30–100)

## 2024-11-11 LAB — TSH: TSH: 1.42 m[IU]/L (ref 0.40–4.50)

## 2024-11-11 LAB — PSA: PSA: 1.66 ng/mL (ref <=4.00)

## 2024-11-17 ENCOUNTER — Other Ambulatory Visit: Payer: Self-pay | Admitting: Family Medicine

## 2024-11-17 ENCOUNTER — Encounter: Payer: Self-pay | Admitting: Family Medicine

## 2024-11-17 ENCOUNTER — Ambulatory Visit (INDEPENDENT_AMBULATORY_CARE_PROVIDER_SITE_OTHER): Payer: Self-pay | Admitting: Family Medicine

## 2024-11-17 VITALS — BP 132/70 | HR 78 | Ht 70.0 in | Wt 240.0 lb

## 2024-11-17 DIAGNOSIS — E66811 Obesity, class 1: Secondary | ICD-10-CM

## 2024-11-17 DIAGNOSIS — E559 Vitamin D deficiency, unspecified: Secondary | ICD-10-CM

## 2024-11-17 DIAGNOSIS — I10 Essential (primary) hypertension: Secondary | ICD-10-CM

## 2024-11-17 DIAGNOSIS — Z85820 Personal history of malignant melanoma of skin: Secondary | ICD-10-CM

## 2024-11-17 DIAGNOSIS — N521 Erectile dysfunction due to diseases classified elsewhere: Secondary | ICD-10-CM

## 2024-11-17 DIAGNOSIS — E7800 Pure hypercholesterolemia, unspecified: Secondary | ICD-10-CM | POA: Diagnosis not present

## 2024-11-17 DIAGNOSIS — Z125 Encounter for screening for malignant neoplasm of prostate: Secondary | ICD-10-CM

## 2024-11-17 DIAGNOSIS — Z0000 Encounter for general adult medical examination without abnormal findings: Secondary | ICD-10-CM | POA: Diagnosis not present

## 2024-11-17 DIAGNOSIS — K219 Gastro-esophageal reflux disease without esophagitis: Secondary | ICD-10-CM

## 2024-11-17 DIAGNOSIS — I480 Paroxysmal atrial fibrillation: Secondary | ICD-10-CM | POA: Diagnosis not present

## 2024-11-17 DIAGNOSIS — Z23 Encounter for immunization: Secondary | ICD-10-CM | POA: Diagnosis not present

## 2024-11-17 DIAGNOSIS — R351 Nocturia: Secondary | ICD-10-CM

## 2024-11-17 DIAGNOSIS — I2510 Atherosclerotic heart disease of native coronary artery without angina pectoris: Secondary | ICD-10-CM

## 2024-11-17 DIAGNOSIS — G4733 Obstructive sleep apnea (adult) (pediatric): Secondary | ICD-10-CM

## 2024-11-17 DIAGNOSIS — R7309 Other abnormal glucose: Secondary | ICD-10-CM

## 2024-11-17 MED ORDER — OMEPRAZOLE 40 MG PO CPDR: 40.0000 mg | DELAYED_RELEASE_CAPSULE | Freq: Every day | ORAL | 3 refills | Status: AC

## 2024-11-17 MED ORDER — AMLODIPINE BESYLATE 5 MG PO TABS: 5.0000 mg | ORAL_TABLET | Freq: Every day | ORAL | 3 refills | Status: AC

## 2024-11-17 MED ORDER — SILDENAFIL CITRATE 50 MG PO TABS: 50.0000 mg | ORAL_TABLET | Freq: Every day | ORAL | 5 refills | Status: AC | PRN

## 2024-11-17 MED ORDER — ROSUVASTATIN CALCIUM 10 MG PO TABS: 10.0000 mg | ORAL_TABLET | Freq: Every day | ORAL | 3 refills | Status: AC

## 2024-11-17 NOTE — Patient Instructions (Addendum)
 Thank you for coming to the office today.  Refilled all medications for 90 days  BP improved on re-check  Goal to improve hydration and focus on portion control.  DUE for FASTING BLOOD WORK (no food or drink after midnight before the lab appointment, only water or coffee without cream/sugar on the morning of)  SCHEDULE Lab Only visit in the morning at the clinic for lab draw in 1 YEAR  - Make sure Lab Only appointment is at about 1 week before your next appointment, so that results will be available  For Lab Results, once available within 2-3 days of blood draw, you can can log in to MyChart online to view your results and a brief explanation. Also, we can discuss results at next follow-up visit.   Please schedule a Follow-up Appointment to: Return for 1 year fasting lab > 1 week later Annual Physical.  If you have any other questions or concerns, please feel free to call the office or send a message through MyChart. You may also schedule an earlier appointment if necessary.  Additionally, you may be receiving a survey about your experience at our office within a few days to 1 week by e-mail or mail. We value your feedback.  Marsa Officer, DO [facility], [place]

## 2024-11-17 NOTE — Progress Notes (Signed)
 Subjective:    Patient ID: Vincent Clements, male    DOB: 1961-12-14, 63 y.o.   MRN: [id number]  Vincent Clements is a 63 y.o. male presenting on 11/17/2024 for Annual Exam   HPI  Discussed the use of AI scribe software for clinical note transcription with the patient, who gave verbal consent to proceed.  History of Present Illness   Vincent Clements is a 63 year old male who presents for an annual physical exam.  Chronic Sinus Drainage / Cough Post covid / respiratory illness, with persistent symptoms  Elevated Hemoglobin Hgb 17.6 and HCT 52.8 Suspected mild dehydration hemoconcentration Admits inc soda and not enough water  GERD On Omeprazole  40mg , controlled   CHRONIC HTN: Slight increase BP lately but home readings normal 120-130s Cardiology previously discontinued Carvedilol  3.125mg  dose due to low BP. He has taken PRN Current Meds - Amlodipine  5mg  daily OFF Carvedilol  3.125mg  TWICE A DAY, Losartan 25mg  daily Reports good compliance, took meds today. Tolerating well, w/o complaints. Denies CP, dyspnea, HA, edema, dizziness / lightheadedness   Pre-Diabetes / Obesity BMI >34 A1c up to 5.6, prior 5.4 to 5.5, gradual increase CBGs: Not checking Meds: never on med Reports good compliance. Tolerating well w/o side-effects Currently not on ACEi/ARB - off losartan Lifestyle: Weight gain +30 lbs in past 1 year - Diet (improved healthy diet and limited carbs and reading food labels) he does inc portions lately - Exercise (active but less exercise) - sometimes limited by knee pain Denies hypoglycemia   HYPERLIPIDEMIA: - Reports no concerns. Last lipid panel 10/2024, controlled with lifestyle diet and med - LDL 70 - Currently taking Rosuvastatin  10mg , tolerating well without side effects or myalgias   Vitamin D  Deficiency Vitamin D  34 On supplement daily 2k   OSA, on CPAP - Patient reports prior history of dx OSA and on CPAP since Sept 2022, he had initial sleep study 02/2021,  followed by [facility]. Mostly doing well with it but occasional nights can wake up early due to mask - Overall doing well more rested during daytime, less daytime sleepiness and fatigue. - Today reports that sleep apnea is well controlled. He uses the CPAP machine every night. Tolerates the machine well, and thinks that sleeps better with it and feels good. No new concerns or symptoms. - Average 6 hours of sleep   Paroxysmal Atrial Fibrillation Followed by Cardiology On Aspirin . Off beta blocker intolerance due to low BP. Not on anticoagulation     History of Melanoma - Dermatology - followed by Dr Hester, surveillance He has had several dysplastic nevi      Health Maintenance:  Prevnar-20 vaccine today  Declines Flu Vaccine   Updated Colonoscopy 03/20/21, repeat 7 years, 2029.   PSA 1.66 (10/2024) negative, previously had mild elevation 1.32 (previously 0.89-0.97)     11/17/2024    8:06 AM 11/11/2023    8:29 AM 06/02/2023    8:24 AM  Depression screen PHQ 2/9  Decreased Interest 0 0 0  Down, Depressed, Hopeless 0 0 0  PHQ - 2 Score 0 0 0  Altered sleeping 2    Tired, decreased energy 0    Change in appetite 2    Feeling bad or failure about yourself  0    Trouble concentrating 0    Moving slowly or fidgety/restless 0    Suicidal thoughts 0    PHQ-9 Score 4    Difficult doing work/chores Not difficult at all  11/17/2024    8:06 AM 11/11/2023    8:29 AM 06/02/2023    8:24 AM 12/31/2022    8:10 AM  GAD 7 : Generalized Anxiety Score  Nervous, Anxious, on Edge 0 0 0 0  Control/stop worrying 0 0 0 0  Worry too much - different things 0 0 0 0  Trouble relaxing 0 0 0 0  Restless 0 0 0 0  Easily annoyed or irritable 1 0 0 0  Afraid - awful might happen 0 0 0 0  Total GAD 7 Score 1 0 0 0  Anxiety Difficulty Not difficult at all   Not difficult at all     Past Medical History:  Diagnosis Date  . Arthritis   . Dysplastic nevus 01/30/2022   R sup lat scapula  superior, mod to severe  . Dysplastic nevus 01/30/2022   R sup lat scapula inferior, mod to severe, shave removal 05/01/2022  . Dysplastic nevus 05/01/2022   Right pectoral above areola, moderate atypia  . Dysplastic nevus 05/01/2022   Left ant thigh, moderate atypia  . Dysplastic nevus 12/05/2022   left sup scapula medial - moderate  . Dysplastic nevus 12/05/2022   L sup med scapula lat - moderate  . Dysplastic nevus 05/25/2024   right middle medial dorsum forearm,severe- excision 06/09/24  . Hyperlipemia   . Hypertension   . Melanoma in situ (HCC) 01/24/2022   right lateral neck, mohs completed 04/05/2022   Past Surgical History:  Procedure Laterality Date  . CHOLECYSTECTOMY  10/06/2006  . COLONOSCOPY WITH PROPOFOL  N/A 03/17/2018   Procedure: COLONOSCOPY WITH PROPOFOL ;  Surgeon: Therisa Bi, MD;  Location: [facility] ENDOSCOPY;  Service: Gastroenterology;  Laterality: N/A;  . COLONOSCOPY WITH PROPOFOL  N/A 03/20/2021   Procedure: COLONOSCOPY WITH PROPOFOL ;  Surgeon: Therisa Bi, MD;  Location: [facility] ENDOSCOPY;  Service: Gastroenterology;  Laterality: N/A;  . KNEE ARTHROSCOPY Right 02/27/2017   Procedure: right knee arthroscopy, partial medial and lateral menisectomy;  Surgeon: Ozell Flake, MD;  Location: [facility] ORS;  Service: Orthopedics;  Laterality: Right;   Social History   Socioeconomic History  . Marital status: Married    Spouse name: Not on file  . Number of children: Not on file  . Years of education: [employer]  . Highest education level: Some college, no degree  Occupational History  . Occupation: Passenger Transport Manager  Tobacco Use  . Smoking status: Never  . Smokeless tobacco: Former    Types: Chew, Snuff    Quit date: 12/31/1991  . Tobacco comments:    pt quit chewing tobacco 01/11/2006  Vaping Use  . Vaping status: Never Used  Substance and Sexual Activity  . Alcohol use: Not Currently    Comment: occasionally, 1 per week  . Drug use: No  . Sexual activity: Not on file   Other Topics Concern  . Not on file  Social History Narrative  . Not on file   Social Drivers of Health   Financial Resource Strain: Low Risk  (11/11/2024)   Overall Financial Resource Strain (CARDIA)   . Difficulty of Paying Living Expenses: Not very hard  Food Insecurity: No Food Insecurity (11/11/2024)   Hunger Vital Sign   . Worried About Programme Researcher, Broadcasting/film/video in the Last Year: Never true   . Ran Out of Food in the Last Year: Never true  Transportation Needs: No Transportation Needs (11/11/2024)   PRAPARE - Transportation   . Lack of Transportation (Medical): No   . Lack of Transportation (Non-Medical):  No  Physical Activity: Inactive (11/11/2024)   Exercise Vital Sign   . Days of Exercise per Week: 0 days   . Minutes of Exercise per Session: Not on file  Stress: No Stress Concern Present (11/11/2024)   [employer] of Occupational Health - Occupational Stress Questionnaire   . Feeling of Stress: Only a little  Social Connections: Moderately Integrated (11/11/2024)   Social Connection and Isolation Panel   . Frequency of Communication with Friends and Family: More than three times a week   . Frequency of Social Gatherings with Friends and Family: More than three times a week   . Attends Religious Services: Patient declined   . Active Member of Clubs or Organizations: Yes   . Attends Banker Meetings: Never   . Marital Status: Married  Catering Manager Violence: Not on file   Family History  Problem Relation Age of Onset  . Heart disease Mother   . Heart failure Mother   . Stroke Maternal Grandfather    Current Outpatient Medications on File Prior to Visit  Medication Sig  . aspirin  EC 81 MG tablet Take 81 mg by mouth at bedtime.   SABRA azelastine (ASTELIN) 0.1 % nasal spray Place into both nostrils 2 (two) times daily. Use in each nostril as directed  . Cholecalciferol (D3 PO) Take 2,000 Int'l Units by mouth daily.  . Cyanocobalamin  (B-12 PO) Take  1,000 mcg by mouth daily at 6 (six) AM.  . Multiple Vitamins-Minerals (CENTRUM SILVER 50+MEN PO) Take by mouth.   No current facility-administered medications on file prior to visit.    Review of Systems  Constitutional:  Negative for activity change, appetite change, chills, diaphoresis, fatigue and fever.  HENT:  Negative for congestion and hearing loss.   Eyes:  Negative for visual disturbance.  Respiratory:  Negative for cough, chest tightness, shortness of breath and wheezing.   Cardiovascular:  Negative for chest pain, palpitations and leg swelling.  Gastrointestinal:  Negative for abdominal pain, constipation, diarrhea, nausea and vomiting.  Genitourinary:  Negative for dysuria, frequency and hematuria.  Musculoskeletal:  Negative for arthralgias and neck pain.  Skin:  Negative for rash.  Neurological:  Negative for dizziness, weakness, light-headedness, numbness and headaches.  Hematological:  Negative for adenopathy.  Psychiatric/Behavioral:  Negative for behavioral problems, dysphoric mood and sleep disturbance.    Per HPI unless specifically indicated above     Objective:    BP 132/70 (BP Location: Left Arm, Cuff Size: Normal)   Pulse 78   Ht 5' 10 (1.778 m)   Wt 240 lb (108.9 kg)   SpO2 95%   BMI 34.44 kg/m   Wt Readings from Last 3 Encounters:  11/17/24 240 lb (108.9 kg)  11/11/23 210 lb (95.3 kg)  06/02/23 215 lb (97.5 kg)    Physical Exam Vitals and nursing note reviewed.  Constitutional:      General: He is not in acute distress.    Appearance: He is well-developed. He is not diaphoretic.     Comments: Well-appearing, comfortable, cooperative  HENT:     Head: Normocephalic and atraumatic.  Eyes:     General:        Right eye: No discharge.        Left eye: No discharge.     Conjunctiva/sclera: Conjunctivae normal.     Pupils: Pupils are equal, round, and reactive to light.  Neck:     Thyroid : No thyromegaly.     Vascular: No carotid bruit.  Cardiovascular:     Rate and Rhythm: Normal rate and regular rhythm.     Pulses: Normal pulses.     Heart sounds: Normal heart sounds. No murmur heard. Pulmonary:     Effort: Pulmonary effort is normal. No respiratory distress.     Breath sounds: Normal breath sounds. No wheezing or rales.  Abdominal:     General: Bowel sounds are normal. There is no distension.     Palpations: Abdomen is soft. There is no mass.     Tenderness: There is no abdominal tenderness.  Musculoskeletal:        General: No tenderness. Normal range of motion.     Cervical back: Normal range of motion and neck supple.     Right lower leg: No edema.     Left lower leg: No edema.     Comments: Upper / Lower Extremities: - Normal muscle tone, strength bilateral upper extremities 5/5, lower extremities 5/5  Lymphadenopathy:     Cervical: No cervical adenopathy.  Skin:    General: Skin is warm and dry.     Findings: No erythema or rash.  Neurological:     Mental Status: He is alert and oriented to person, place, and time.     Comments: Distal sensation intact to light touch all extremities  Psychiatric:        Mood and Affect: Mood normal.        Behavior: Behavior normal.        Thought Content: Thought content normal.     Comments: Well groomed, good eye contact, normal speech and thoughts     I have personally reviewed the radiology report from 03/21/22 CT Heart  ADDENDUM REPORT: 03/21/2022 16:32   ADDENDUM: OVER-READ INTERPRETATION  CT CHEST   The following report is an over-read performed by radiologist Dr. Isla Qua [facility] on 03/21/2022. This over-read does not include interpretation of cardiac or coronary anatomy or pathology. The coronary calcium  score and CT angiography interpretation by the cardiologist is attached. Imaging of the chest is focused on cardiac structures and excludes much of the chest on CT.   COMPARISON:   None   FINDINGS:   Cardiovascular: See  dedicated report for cardiovascular details. Scattered aortic atherosclerosis.   Mediastinum/Nodes: Visualized portions of the mediastinum without acute findings. No adenopathy in the visualized portions of the chest. Limited imaging of the esophagus is grossly unremarkable.   Lungs/Pleura: Visualized airways are patent in visible portions of the lungs are clear atelectasis. Aside from mild basilar   Upper Abdomen: Incidental imaging of upper abdominal contents without acute process. Very limited assessment.   Musculoskeletal: No acute bone finding. No destructive bone process. Spinal degenerative changes.   IMPRESSION:   1. No acute findings in the chest. 2. Aortic atherosclerosis.   Aortic Atherosclerosis (ICD10-I70.0).     Electronically Signed   By: Isla Blind M.D.   On: 03/21/2022 16:32    Addended by Blind Isla BRAVO, MD on 03/21/2022  4:34 PM    Study Result  Narrative & Impression  CLINICAL DATA:  Chest pain   EXAM: Cardiac/Coronary  CTA   TECHNIQUE: The patient was scanned on a Siemens Somatom go.Top scanner.   : A retrospective scan was triggered in the descending thoracic aorta. Axial non-contrast 3 mm slices were carried out through the heart. The data set was analyzed on a dedicated work station and scored using the Agatson method. Gantry rotation speed was 330 msecs and collimation was .6  mm. 100mg  of metoprolol  and 0.8 mg of sl NTG was given. The 3D data set was reconstructed in 5% intervals of the 60-95 % of the R-R cycle. Diastolic phases were analyzed on a dedicated work station using MPR, MIP and VRT modes. The patient received 100 cc of contrast.   FINDINGS: Aorta: Normal size. Aortic root and descending aorta calcifications. No dissection.   Aortic Valve:  Trileaflet.  No calcifications.   Coronary Arteries:  Normal coronary origin.  Right dominance.   RCA is a dominant artery that gives rise to PDA and PLA. There calcified plaque  in the proximal segment causing mild stenosis (25-49%).   Left main gives rise to LAD and LCX arteries. Mild stenosis noted in the distal LM (25%).   LAD has calcified plaque in the proximal to mid segments causing mild stenosis (25-49%).   LCX is a non-dominant artery that gives rise to two obtuse marginal branches. There is calcified plaque in the mid segment causing minimal stenosis (<25%).   Other findings:   Normal pulmonary vein drainage into the left atrium.   Normal left atrial appendage without a thrombus.   Normal size of the pulmonary artery.   IMPRESSION: 1. Coronary calcium  score of 807. This was 96th percentile for age and sex matched control.   2. Normal coronary origin with right dominance.   3. Calcified plaque causing mild stenosis in the proximal LAD and RCA.   4. Calcified plaque causing minimal stenosis in the LM and LCx   5. CAD-RADS 2. Mild non-obstructive CAD (25-49%). Consider non-atherosclerotic causes of chest pain. Consider preventive therapy and risk factor modification.   Electronically Signed: By: Redell Cave M.D. On: 03/21/2022 15:39     Results for orders placed or performed in visit on 11/10/24  TSH   Collection Time: 11/10/24  8:05 AM  Result Value Ref Range   TSH 1.42 0.40 - 4.50 mIU/L  VITAMIN D  25 Hydroxy (Vit-D Deficiency, Fractures)   Collection Time: 11/10/24  8:05 AM  Result Value Ref Range   Vit D, 25-Hydroxy 34 30 - 100 ng/mL  PSA   Collection Time: 11/10/24  8:05 AM  Result Value Ref Range   PSA 1.66 < OR = 4.00 ng/mL  CBC with Differential/Platelet   Collection Time: 11/10/24  8:05 AM  Result Value Ref Range   WBC 7.8 3.8 - 10.8 Thousand/uL   RBC 5.86 (H) 4.20 - 5.80 Million/uL   Hemoglobin 17.6 (H) 13.2 - 17.1 g/dL   HCT 47.1 (H) 61.4 - 49.9 %   MCV 90.1 80.0 - 100.0 fL   MCH 30.0 27.0 - 33.0 pg   MCHC 33.3 32.0 - 36.0 g/dL   RDW 87.2 88.9 - 84.9 %   Platelets 295 140 - 400 Thousand/uL   MPV 10.9  7.5 - 12.5 fL   Neutro Abs 5,320 1,500 - 7,800 cells/uL   Absolute Lymphocytes 1,295 850 - 3,900 cells/uL   Absolute Monocytes 827 200 - 950 cells/uL   Eosinophils Absolute 281 15 - 500 cells/uL   Basophils Absolute 78 0 - 200 cells/uL   Neutrophils Relative % 68.2 %   Total Lymphocyte 16.6 %   Monocytes Relative 10.6 %   Eosinophils Relative 3.6 %   Basophils Relative 1.0 %  COMPLETE METABOLIC PANEL WITH GFR   Collection Time: 11/10/24  8:05 AM  Result Value Ref Range   Glucose, Bld 119 (H) 65 - 99 mg/dL   BUN 16 7 - 25 mg/dL  Creat 0.73 0.70 - 1.35 mg/dL   BUN/Creatinine Ratio SEE NOTE: 6 - 22 (calc)   Sodium 140 135 - 146 mmol/L   Potassium 4.6 3.5 - 5.3 mmol/L   Chloride 102 98 - 110 mmol/L   CO2 29 20 - 32 mmol/L   Calcium  10.0 8.6 - 10.3 mg/dL   Total Protein 6.9 6.1 - 8.1 g/dL   Albumin 4.7 3.6 - 5.1 g/dL   Globulin 2.2 1.9 - 3.7 g/dL (calc)   AG Ratio 2.1 1.0 - 2.5 (calc)   Total Bilirubin 0.5 0.2 - 1.2 mg/dL   Alkaline phosphatase (APISO) 50 35 - 144 U/L   AST 23 10 - 35 U/L   ALT 33 9 - 46 U/L  Hemoglobin A1c   Collection Time: 11/10/24  8:05 AM  Result Value Ref Range   Hgb A1c MFr Bld 5.6 <5.7 %   Mean Plasma Glucose 114 mg/dL   eAG (mmol/L) 6.3 mmol/L  Lipid panel   Collection Time: 11/10/24  8:05 AM  Result Value Ref Range   Cholesterol 137 <200 mg/dL   HDL 48 > OR = 40 mg/dL   Triglycerides 891 <849 mg/dL   LDL Cholesterol (Calc) 70 mg/dL (calc)   Total CHOL/HDL Ratio 2.9 <5.0 (calc)   Non-HDL Cholesterol (Calc) 89 <869 mg/dL (calc)      Assessment & Plan:   Problem List Items Addressed This Visit     Coronary artery disease involving native coronary artery of native heart without angina pectoris   Relevant Medications   rosuvastatin  (CRESTOR ) 10 MG tablet   amLODipine  (NORVASC ) 5 MG tablet   sildenafil  (VIAGRA ) 50 MG tablet   Elevated hemoglobin A1c   Erectile dysfunction   Relevant Medications   sildenafil  (VIAGRA ) 50 MG tablet    Essential hypertension   Relevant Medications   rosuvastatin  (CRESTOR ) 10 MG tablet   amLODipine  (NORVASC ) 5 MG tablet   sildenafil  (VIAGRA ) 50 MG tablet   Gastroesophageal reflux disease without esophagitis   Relevant Medications   omeprazole  (PRILOSEC) 40 MG capsule   History of melanoma   Obesity (BMI 30.0-34.9)   OSA on CPAP   Paroxysmal atrial fibrillation (HCC)   Relevant Medications   rosuvastatin  (CRESTOR ) 10 MG tablet   amLODipine  (NORVASC ) 5 MG tablet   sildenafil  (VIAGRA ) 50 MG tablet   Pure hypercholesterolemia   Relevant Medications   rosuvastatin  (CRESTOR ) 10 MG tablet   amLODipine  (NORVASC ) 5 MG tablet   sildenafil  (VIAGRA ) 50 MG tablet   Other Visit Diagnoses       Annual physical exam    -  Primary     Need for Streptococcus pneumoniae vaccination       Relevant Orders   Pneumococcal conjugate vaccine 20-valent (Completed)        Updated Health Maintenance information Reviewed recent lab results with patient Encouraged improvement to lifestyle with diet and exercise Goal of weight loss  Adult Wellness Visit Annual physical examination conducted. Blood pressure normalized to 132/70 mmHg. Lab results show well-controlled cholesterol, glucose, and thyroid  function. Hemoglobin slightly elevated. PSA within acceptable range. Vitamin D  stable. - Declined Flu Vaccine - Administered Prevnar 20 vaccine. - Scheduled next annual physical for November 2026.  Essential hypertension Initial elevation, related to anxiety, caffeine Home BP readings reviewed Blood pressure normalized to 132/70 mmHg. Managed with amlodipine  5 mg daily. Off Carvedilol  - Continue amlodipine  5 mg daily.  Paroxysmal atrial fibrillation and atherosclerotic heart disease of native coronary artery Followed by [facility] Dr  [place] Atrial fibrillation well-controlled. Coronary artery disease managed. Last Coronary CT 2023, EKG updated 2025 He remains off Anticoagulation. He  takes Aspirin  81mg  - Continue current management and monitoring. On Rosuvastatin  10mg   Obstructive sleep apnea Managed with CPAP nightly. Possible contribution to elevated hemoglobin levels. - Continue CPAP use nightly.  Obesity, class 1 Weight increased by 30 pounds. Current weight 240 pounds. Plans to focus on portion control and hydration. Exercise limited by knee pain. - Encouraged portion control and hydration. - Encouraged regular physical activity as tolerated.  Pure hypercholesterolemia Cholesterol well-controlled with rosuvastatin . LDL 70 mg/dL. - Continue rosuvastatin  as prescribed.  Elevated A1c A1c 5.6%, indicating stable glucose control. Reports healthy eating but struggles with portion sizes. - Continue current dietary management.  Gastro-esophageal reflux disease GERD managed with omeprazole . No new symptoms. - Continue omeprazole  as prescribed.  Erectile dysfunction Previously managed with sildenafil  20 mg, ineffective. Discussed increasing dose to 50 mg and using GoodRx for cost management. - Prescribed sildenafil  50 mg, 30 tablets, as needed. - Use GoodRx for cost management.  Personal history of malignant melanoma of skin Under surveillance for dysplastic nevi with dermatology. - Continue dermatology surveillance as scheduled.         Orders Placed This Encounter  Procedures  . Pneumococcal conjugate vaccine 20-valent    Meds ordered this encounter  Medications  . rosuvastatin  (CRESTOR ) 10 MG tablet    Sig: Take 1 tablet (10 mg total) by mouth at bedtime.    Dispense:  90 tablet    Refill:  3    Add refills for up to 1 year  . omeprazole  (PRILOSEC) 40 MG capsule    Sig: Take 1 capsule (40 mg total) by mouth daily before breakfast.    Dispense:  90 capsule    Refill:  3    Add refills for up to 1 year  . amLODipine  (NORVASC ) 5 MG tablet    Sig: Take 1 tablet (5 mg total) by mouth daily.    Dispense:  90 tablet    Refill:  3    Add refills  for up to 1 year  . sildenafil  (VIAGRA ) 50 MG tablet    Sig: Take 1-2 tablets (50-100 mg total) by mouth daily as needed for erectile dysfunction.    Dispense:  30 tablet    Refill:  5     Follow up plan: Return for 1 year fasting lab > 1 week later Annual Physical.  11/09/25  Marsa Officer, DO [facility] 11/17/2024, 8:11 AM

## 2024-12-07 ENCOUNTER — Ambulatory Visit: Admitting: Dermatology

## 2024-12-07 NOTE — Patient Instructions (Signed)

## 2024-12-07 NOTE — Progress Notes (Unsigned)
 Follow-Up Visit   Subjective  Vincent Clements is a 63 y.o. male who presents for the following: Skin Cancer Screening and Full Body Skin Exam Hx of mmis , hx of dysplastic nevi, hx of epidermal cyst, hx of isks.   hx of dysplastic nevus at right arm seen by Dr. Claudene 06/09/2024   The patient presents for Total-Body Skin Exam (TBSE) for skin cancer screening and mole check. The patient has spots, moles and lesions to be evaluated, some may be new or changing and the patient may have concern these could be cancer.    The following portions of the chart were reviewed this encounter and updated as appropriate: medications, allergies, medical history  Review of Systems:  No other skin or systemic complaints except as noted in HPI or Assessment and Plan.  Objective  Well appearing patient in no apparent distress; mood and affect are within normal limits.  A full examination was performed including scalp, head, eyes, ears, nose, lips, neck, chest, axillae, abdomen, back, buttocks, bilateral upper extremities, bilateral lower extremities, hands, feet, fingers, toes, fingernails, and toenails. All findings within normal limits unless otherwise noted below.   Relevant physical exam findings are noted in the Assessment and Plan.    Assessment & Plan   HISTORY OF MELANOMA IN SITU. Right lateral neck. Mohs 04/05/2022. - No evidence of recurrence today - No lymphadenopathy  - Recommend regular full body skin exams - Recommend daily broad spectrum sunscreen SPF 30+ to sun-exposed areas, reapply every 2 hours as needed.  - Call if any new or changing lesions are noted between office visits    HISTORY OF DYSPLASTIC NEVUS 06/09/2024 right middle medial dorsum forearm - severe atypia - excision 06/09/2024 Dr. Claudene margin clear   12/05/2022 left sup med scapula lateral - moderate Left superior scapula medial - moderate  05/01/2022 left anterior thigh - moderate Right pectoral above areola  moderate  01/30/2022 right sup lateral scapula superior mod to severe  Right superior lateral scapula superior mod to severe  No evidence of recurrence today Recommend regular full body skin exams Recommend daily broad spectrum sunscreen SPF 30+ to sun-exposed areas, reapply every 2 hours as needed.  Call if any new or changing lesions are noted between office visits  SKIN CANCER SCREENING PERFORMED TODAY.  ACTINIC DAMAGE - Chronic condition, secondary to cumulative UV/sun exposure - diffuse scaly erythematous macules with underlying dyspigmentation - Recommend daily broad spectrum sunscreen SPF 30+ to sun-exposed areas, reapply every 2 hours as needed.  - Staying in the shade or wearing long sleeves, sun glasses (UVA+UVB protection) and wide brim hats (4-inch brim around the entire circumference of the hat) are also recommended for sun protection.  - Call for new or changing lesions.  LENTIGINES, SEBORRHEIC KERATOSES, HEMANGIOMAS - Benign normal skin lesions - Benign-appearing - Call for any changes  MELANOCYTIC NEVI - Tan-brown and/or pink-flesh-colored symmetric macules and papules - Benign appearing on exam today - Observation - Call clinic for new or changing moles - Recommend daily use of broad spectrum spf 30+ sunscreen to sun-exposed areas.   R plantar foot mid sole 0.5cm brown macule,  0.2 cm x 0.25 cm brown macule stable compared to photo/measurement   Sebaceous Hyperplasia - Small yellow papules with a central dell at forehead - Benign-appearing - Observe. Call for changes.   Acrochordons (Skin Tags) At b/l axilla  - Fleshy, skin-colored pedunculated papules - Benign appearing.  - Observe. - If desired, they can be removed  with an in office procedure that is not covered by insurance. - Please call the clinic if you notice any new or changing lesions.   EPIDERMAL INCLUSION CYST Exam: 1.5 cm Subcutaneous nodule at right posterior waistline Benign-appearing. Exam  most consistent with an epidermal inclusion cyst. Discussed that a cyst is a benign growth that can grow over time and sometimes get irritated or inflamed. Recommend observation if it is not bothersome. Discussed option of surgical excision to remove it if it is growing, symptomatic, or other changes noted. Please call for new or changing lesions so they can be evaluated.   DERMATOFIBROMA Exam: Firm pink/brown papulenodule with dimple sign at left wrist. Treatment Plan: A dermatofibroma is a benign growth possibly related to trauma, such as an insect bite, cut from shaving, or inflamed acne-type bump.  Treatment options to remove include shave or excision with resulting scar and risk of recurrence.  Since benign-appearing and not bothersome, will observe for now.    KERATOSIS PILARIS Back, triceps - Tiny follicular keratotic papules - Benign. Genetic in nature. No cure. - Observe. - If desired, patient can use an emollient (moisturizer) containing ammonium lactate (AmLactin), urea or salicylic acid once a day to smooth the area  Recommend starting moisturizer with exfoliant (Urea, Salicylic acid, or Lactic acid) one to two times daily to help smooth rough and bumpy skin.  OTC options include Cetaphil Rough and Bumpy lotion (Urea), Eucerin Roughness Relief lotion or spot treatment cream (Urea), CeraVe SA lotion/cream for Rough and Bumpy skin (Sal Acid), Gold Bond Rough and Bumpy cream (Sal Acid), and AmLactin 12% lotion/cream (Lactic Acid).  If applying in morning, also apply sunscreen to sun-exposed areas, since these exfoliating moisturizers can increase sensitivity to sun.     No follow-ups on file.  IEleanor Blush, CMA, am acting as scribe for Alm Rhyme, MD.   Documentation: I have reviewed the above documentation for accuracy and completeness, and I agree with the above.  Alm Rhyme, MD

## 2024-12-08 ENCOUNTER — Encounter: Payer: Self-pay | Admitting: Dermatology

## 2025-02-16 ENCOUNTER — Ambulatory Visit

## 2025-06-15 ENCOUNTER — Ambulatory Visit: Admitting: Dermatology

## 2025-11-09 ENCOUNTER — Other Ambulatory Visit

## 2025-11-17 ENCOUNTER — Encounter: Admitting: Family Medicine
